# Patient Record
Sex: Female | Born: 1985
Health system: Southern US, Community
[De-identification: ages and names within clinical notes are randomized; demographics above are authoritative.]

## PROBLEM LIST (undated history)

## (undated) DIAGNOSIS — F329 Major depressive disorder, single episode, unspecified: Secondary | ICD-10-CM

## (undated) DIAGNOSIS — F32A Depression, unspecified: Secondary | ICD-10-CM

## (undated) DIAGNOSIS — Z9889 Other specified postprocedural states: Secondary | ICD-10-CM

## (undated) DIAGNOSIS — I10 Essential (primary) hypertension: Secondary | ICD-10-CM

## (undated) DIAGNOSIS — N185 Chronic kidney disease, stage 5: Secondary | ICD-10-CM

## (undated) DIAGNOSIS — R112 Nausea with vomiting, unspecified: Secondary | ICD-10-CM

## (undated) DIAGNOSIS — E119 Type 2 diabetes mellitus without complications: Secondary | ICD-10-CM

## (undated) DIAGNOSIS — J189 Pneumonia, unspecified organism: Secondary | ICD-10-CM

## (undated) DIAGNOSIS — R0600 Dyspnea, unspecified: Secondary | ICD-10-CM

## (undated) DIAGNOSIS — D649 Anemia, unspecified: Secondary | ICD-10-CM

## (undated) DIAGNOSIS — I5020 Unspecified systolic (congestive) heart failure: Secondary | ICD-10-CM

## (undated) DIAGNOSIS — N186 End stage renal disease: Secondary | ICD-10-CM

## (undated) DIAGNOSIS — G43909 Migraine, unspecified, not intractable, without status migrainosus: Secondary | ICD-10-CM

---

## 2014-09-01 ENCOUNTER — Encounter (HOSPITAL_COMMUNITY): Payer: Self-pay | Admitting: Emergency Medicine

## 2014-09-01 ENCOUNTER — Emergency Department (HOSPITAL_COMMUNITY)
Admission: EM | Admit: 2014-09-01 | Discharge: 2014-09-01 | Disposition: A | Discharge status: Home / Self Care | Payer: Medicaid - Out of State | Attending: Emergency Medicine | Admitting: Emergency Medicine

## 2014-09-01 DIAGNOSIS — Z79899 Other long term (current) drug therapy: Secondary | ICD-10-CM | POA: Insufficient documentation

## 2014-09-01 DIAGNOSIS — E1165 Type 2 diabetes mellitus with hyperglycemia: Secondary | ICD-10-CM | POA: Diagnosis not present

## 2014-09-01 DIAGNOSIS — Z9114 Patient's other noncompliance with medication regimen: Secondary | ICD-10-CM | POA: Diagnosis not present

## 2014-09-01 DIAGNOSIS — R739 Hyperglycemia, unspecified: Secondary | ICD-10-CM

## 2014-09-01 LAB — URINALYSIS, ROUTINE W REFLEX MICROSCOPIC
BILIRUBIN URINE: NEGATIVE
Glucose, UA: >1000 mg/dL — AB
Ketones, ur: NEGATIVE mg/dL
NITRITE: NEGATIVE
PROTEIN: 100 mg/dL — AB
SPECIFIC GRAVITY, URINE: 1.031 — AB (ref 1.005–1.030)
UROBILINOGEN UA: 0.2 mg/dL (ref 0.0–1.0)
pH: 5.5 (ref 5.0–8.0)

## 2014-09-01 LAB — COMPREHENSIVE METABOLIC PANEL
ALT: 9 U/L (ref 0–35)
ANION GAP: 16 — AB (ref 5–15)
AST: 15 U/L (ref 0–37)
Albumin: 2.2 g/dL — ABNORMAL LOW (ref 3.5–5.2)
Alkaline Phosphatase: 81 U/L (ref 39–117)
BUN: 14 mg/dL (ref 6–23)
CO2: 20 meq/L (ref 19–32)
Calcium: 8.8 mg/dL (ref 8.4–10.5)
Chloride: 93 mEq/L — ABNORMAL LOW (ref 96–112)
Creatinine, Ser: 0.81 mg/dL (ref 0.50–1.10)
GLUCOSE: 410 mg/dL — AB (ref 70–99)
Potassium: 4.2 mEq/L (ref 3.7–5.3)
SODIUM: 129 meq/L — AB (ref 137–147)
TOTAL PROTEIN: 7.2 g/dL (ref 6.0–8.3)
Total Bilirubin: <0.2 mg/dL — ABNORMAL LOW (ref 0.3–1.2)

## 2014-09-01 LAB — I-STAT BETA HCG BLOOD, ED (MC, WL, AP ONLY): I-stat hCG, quantitative: <5 m[IU]/mL (ref <5)

## 2014-09-01 LAB — CBC
HEMATOCRIT: 38 % (ref 36.0–46.0)
HEMOGLOBIN: 11.8 g/dL — AB (ref 12.0–15.0)
MCH: 19.3 pg — AB (ref 26.0–34.0)
MCHC: 31.1 g/dL (ref 30.0–36.0)
MCV: 62.3 fL — ABNORMAL LOW (ref 78.0–100.0)
Platelets: 335 10*3/uL (ref 150–400)
RBC: 6.1 MIL/uL — AB (ref 3.87–5.11)
RDW: 16.1 % — ABNORMAL HIGH (ref 11.5–15.5)
WBC: 7.7 10*3/uL (ref 4.0–10.5)

## 2014-09-01 LAB — BLOOD GAS, VENOUS
Acid-base deficit: 2.4 mmol/L — ABNORMAL HIGH (ref 0.0–2.0)
Bicarbonate: 22.1 mEq/L (ref 20.0–24.0)
O2 Saturation: 61.1 %
Patient temperature: 98.6
TCO2: 20.2 mmol/L (ref 0–100)
pCO2, Ven: 39.3 mmHg — ABNORMAL LOW (ref 45.0–50.0)
pH, Ven: 7.369 — ABNORMAL HIGH (ref 7.250–7.300)

## 2014-09-01 LAB — URINE MICROSCOPIC-ADD ON

## 2014-09-01 LAB — CBG MONITORING, ED
Glucose-Capillary: 431 mg/dL — ABNORMAL HIGH (ref 70–99)
Glucose-Capillary: 327 mg/dL — ABNORMAL HIGH (ref 70–99)

## 2014-09-01 MED ORDER — FREESTYLE SYSTEM KIT: 1.0000 | PACK | Status: DC | PRN

## 2014-09-01 MED ORDER — METFORMIN HCL 500 MG PO TABS: 500.0000 mg | ORAL_TABLET | Freq: Two times a day (BID) | ORAL | Status: DC

## 2014-09-01 MED ORDER — SODIUM CHLORIDE 0.9 % IV BOLUS (SEPSIS)
2000.0000 mL | Freq: Once | INTRAVENOUS | Status: AC
  Administered 2014-09-01: 2000 mL via INTRAVENOUS

## 2014-09-01 NOTE — ED Notes (Addendum)
VBG results to Dr. Estell Harpin

## 2014-09-01 NOTE — ED Provider Notes (Signed)
CSN: [id number]     Arrival date & time 09/01/14  1900 History   First MD Initiated Contact with Patient 09/01/14 1950     Chief Complaint  Patient presents with  . Hyperglycemia     (Consider location/radiation/quality/duration/timing/severity/associated sxs/prior Treatment) HPI   Lisa Hodges is a 28 y.o. female complaining of lightheaded sensation and worsening over the course of several days. Patient called 911, EMS checked her blood sugar it was 500. Patient is non-insulin-dependent diabetic, should be taking metformin 500 twice a day but has not been taking it regularly, states that she didn't know if her insurance was active, noncompliant for several months. She endorses polyuria and polydipsia. Denies fever, chills, nausea, vomiting, chest pain, shortness of breath.   Past Medical History  Diagnosis Date  . Diabetes mellitus without complication    No past surgical history on file. No family history on file. History  Substance Use Topics  . Smoking status: Never Smoker   . Smokeless tobacco: Not on file  . Alcohol Use: No   OB History    No data available     Review of Systems  10 systems reviewed and found to be negative, except as noted in the HPI.   Allergies  Review of patient's allergies indicates no known allergies.  Home Medications   Prior to Admission medications   Medication Sig Start Date End Date Taking? Authorizing Provider  glucose monitoring kit (FREESTYLE) monitoring kit 1 each by Does not apply route as needed for other. 09/01/14   Dayvin Aber, PA-C  metFORMIN (GLUCOPHAGE) 500 MG tablet Take 1 tablet (500 mg total) by mouth 2 (two) times daily with a meal. 09/01/14   Paula Busenbark, PA-C   BP 91/66 mmHg  Pulse 98  Temp(Src) 98.3 F (36.8 C) (Oral)  Resp 18  SpO2 97%  LMP 08/02/2014 Physical Exam  Constitutional: She is oriented to person, place, and time. She appears well-developed and well-nourished. No distress.  HENT:  Head:  Normocephalic and atraumatic.  Very dry mucous membranes  Eyes: Conjunctivae and EOM are normal. Pupils are equal, round, and reactive to light.  Neck: Normal range of motion. Neck supple.  Cardiovascular: Normal rate, regular rhythm and intact distal pulses.   Pulmonary/Chest: Effort normal and breath sounds normal. No stridor. No respiratory distress. She has no wheezes. She has no rales. She exhibits no tenderness.  Abdominal: Soft. Bowel sounds are normal. She exhibits no distension and no mass. There is no tenderness. There is no rebound and no guarding.  Musculoskeletal: Normal range of motion. She exhibits no edema.  Neurological: She is alert and oriented to person, place, and time.  Psychiatric: She has a normal mood and affect.  Nursing note and vitals reviewed.   ED Course  Procedures (including critical care time) Labs Review Labs Reviewed  CBC - Abnormal; Notable for the following:    RBC 6.10 (*)    Hemoglobin 11.8 (*)    MCV 62.3 (*)    MCH 19.3 (*)    RDW 16.1 (*)    All other components within normal limits  COMPREHENSIVE METABOLIC PANEL - Abnormal; Notable for the following:    Sodium 129 (*)    Chloride 93 (*)    Glucose, Bld 410 (*)    Albumin 2.2 (*)    Total Bilirubin <0.2 (*)    Anion gap 16 (*)    All other components within normal limits  URINALYSIS, ROUTINE W REFLEX MICROSCOPIC - Abnormal; Notable for the  following:    APPearance CLOUDY (*)    Specific Gravity, Urine 1.031 (*)    Glucose, UA >1000 (*)    Hgb urine dipstick TRACE (*)    Protein, ur 100 (*)    Leukocytes, UA TRACE (*)    All other components within normal limits  BLOOD GAS, VENOUS - Abnormal; Notable for the following:    pH, Ven 7.369 (*)    pCO2, Ven 39.3 (*)    Acid-base deficit 2.4 (*)    All other components within normal limits  URINE MICROSCOPIC-ADD ON - Abnormal; Notable for the following:    Squamous Epithelial / LPF FEW (*)    Bacteria, UA MANY (*)    All other  components within normal limits  CBG MONITORING, ED - Abnormal; Notable for the following:    Glucose-Capillary 431 (*)    All other components within normal limits  CBG MONITORING, ED - Abnormal; Notable for the following:    Glucose-Capillary 327 (*)    All other components within normal limits  I-STAT BETA HCG BLOOD, ED (MC, WL, AP ONLY)    Imaging Review No results found.   EKG Interpretation None      MDM   Final diagnoses:  Hyperglycemia without ketosis    Filed Vitals:   09/01/14 1911 09/01/14 2127 09/01/14 2310  BP: 120/86 118/72 91/66  Pulse: 100 95 98  Temp: 98.5 F (36.9 C) 98.3 F (36.8 C)   TempSrc: Oral Oral   Resp: _0 SpO2: 100% 100% 97%    Medications  sodium chloride 0.9 % bolus 2,000 mL (0 mLs Intravenous Stopped 09/01/14 2225)    [place] is a pleasant 28 y.o. female presenting with lightheadedness, patient appears very dehydrated, she's been noncompliant with her metformin for several months. Blood glucose is elevated to 431, VBG is non-concerning. Hyperglycemia is likely secondary to medication noncompliance, no signs of infection.  Patient has a very mildly increased anion gap of 16. Her corrected sodium is 136. Blood glucose has normalized after 2 L. Patient has no signs of urinary tract infection, we'll defer treatment at this time. Prescription given for metformin.  Evaluation does not show pathology that would require ongoing emergent intervention or inpatient treatment. Pt is hemodynamically stable and mentating appropriately. Discussed findings and plan with patient/guardian, who agrees with care plan. All questions answered. Return precautions discussed and outpatient follow up given.         Monico Blitz, PA-C 09/02/14 0112  Maudry Diego, MD 09/03/14 [phone]

## 2014-09-01 NOTE — ED Notes (Signed)
Pt reports via EMS with c/o hyperglycemia. Pt recently moved here from [place] and does not have a glucometer or endocrinologist. When EMS check pt's sugar, her CBG was 500. Pt c/o generalized weakness and dizziness.

## 2014-09-01 NOTE — Discharge Instructions (Signed)
Do not hesitate to return to the emergency room for any new, worsening or concerning symptoms.  Please obtain primary care using resource guide below. But the minute you were seen in the emergency room and that they will need to obtain records for further outpatient management.   Emergency Department Resource Guide 1) Find a Doctor and Pay Out of Pocket Although you won't have to find out who is covered by your insurance plan, it is a good idea to ask around and get recommendations. You will then need to call the office and see if the doctor you have chosen will accept you as a new patient and what types of options they offer for patients who are self-pay. Some doctors offer discounts or will set up payment plans for their patients who do not have insurance, but you will need to ask so you aren't surprised when you get to your appointment.  2) Contact Your Local Health Department Not all health departments have doctors that can see patients for sick visits, but many do, so it is worth a call to see if yours does. If you don't know where your local health department is, you can check in your phone book. The CDC also has a tool to help you locate your state's health department, and many state websites also have listings of all of their local health departments.  3) Find a [facility] If your illness is not likely to be very severe or complicated, you may want to try a walk in clinic. These are popping up all over the country in pharmacies, drugstores, and shopping centers. They're usually staffed by nurse practitioners or physician assistants that have been trained to treat common illnesses and complaints. They're usually fairly quick and inexpensive. However, if you have serious medical issues or chronic medical problems, these are probably not your best option.  No Primary Care Doctor: - Call Health Connect at  [phone] - they can help you locate a primary care doctor that  accepts your insurance,  provides certain services, etc. - Physician Referral Service- [phone]  Chronic Pain Problems: Organization         Address  Phone   Notes  [facility]  [phone] Patients need to be referred by their primary care doctor.   Medication Assistance: Organization         Address  Phone   Notes  [facility] Medication [facility] [address] [place], [place] [address] [phone] --Must be a resident of [facility] -- Must have NO insurance coverage whatsoever (no Medicaid/ Medicare, etc.) -- The pt. MUST have a primary care doctor that directs their care regularly and follows them in the community   MedAssist  [phone]   [employer]  [phone]    Agencies that provide inexpensive medical care: Organization         Address  Phone   Notes  [place]  [phone]   [facility]    [phone]   [facility] [place], [place] [address] [phone]   [place] [address] [place] [phone]   Planned Parenthood    [phone]   [facility]    [phone]   [place] [address], [place] Phone:  [phone], Fax:  [phone] Hours of Operation:  9 am - 6  pm, M-F.  Also accepts Medicaid/Medicare and self-pay.  [facility] for [place] [address], [address], [place] Phone: [phone], Fax: [phone]. Hours of Operation:  8:30 am - 5:30 pm, M-F.  Also accepts Medicaid and self-pay.  [facility] [place] [address], [place] Phone: [phone]   [place], [place], [place] [phone], Ext. [phone] Mondays & Thursdays: 7-9 AM.  First 15 patients are seen on a first come, first serve basis.    [place] Providers:  Organization         Address  Phone    Notes  [facility] [address], [place] [phone] Also accepts self-pay patients.  [facility] [address], [place]  [phone]   [place], [address], [place] [phone]   [facility] [address] [place] [phone]   Lucianne Lei [address], [place]   [phone] Only accepts [place] Access [place] patients after they have their name applied to their card.   Self-Pay (no insurance) in [facility]:  Organization         Address  Phone   Notes  Sickle Cell Patients, [facility] [place] [phone]   [facility] [place] [phone]   [facility] [place]  [place], [place], [place] [phone]   [facility]/Dr. Osei-Bonsu  [address], [place] or [address], [place] [phone] Phone number for both [place] and [place] locations is the same.  [facility] [address], [place] [phone]   [facility] [address], [place] or [address] [phone]   [facility] [address] [place] [phone], phone; [phone], fax Sees patients 1st and 3rd Saturday of every month.  Must not qualify for public or private insurance (i.e. Medicaid, Medicare, [place] Health Choice, Veterans' Benefits)  Household income should be no more than 200% of the poverty level The clinic cannot treat you if you are pregnant or think you are pregnant  Sexually transmitted diseases are not treated at the clinic.    Dental Care: Organization         Address  Phone  Notes  [facility] Department of [facility] [phone] Accepts children up to age 13 who are enrolled in [place] or [place]; pregnant women with a Medicaid card; and children who have applied for Medicaid or [place] Health Choice, but were declined, whose parents can pay a reduced fee at time of service.  [facility] Department of [facility]  [address], [place] [phone] Accepts children up to age 40 who are enrolled in [place] or [place]; pregnant women with a Medicaid card; and children who have applied for Medicaid or [place] Health Choice, but were declined, whose parents can pay a reduced fee at time of service.  [place] Adult [facility] Access PROGRAM  [place] [phone] Patients are seen by appointment only. Walk-ins are not accepted. [place] will see patients 38 years of age and older.  Monday - Tuesday (8am-5pm) Most Wednesdays (8:30-5pm) $30 per visit, cash only  [facility] Adult Dental Access PROGRAM  [address], [facility] [phone] Patients are seen by appointment only. Walk-ins are not accepted. [place] will see patients 26 years of age and older. One Wednesday Evening (Monthly: Volunteer Based).  $30 per visit, cash only  [place]  [phone] for adults; Children under age 47, call Graduate Pediatric Dentistry at [phone]. Children aged 30-14, please call [phone] to request a pediatric application.  Dental services are provided in all areas of dental care including fillings, crowns and bridges, complete and partial dentures, implants, gum treatment, root canals, and extractions. Preventive care is also provided. Treatment is provided to both adults and children. Patients are selected via a lottery and there is often a waiting list.   [facility] [address] [place]  [phone] [email or web address]   [facility] [address] [place], [place] [phone], Ext.  123 Second and Fourth Thursday of each month, opens at 6:30 AM; Clinic ends at 9 AM.  Patients are seen on a first-come first-served basis, and a limited number are seen during each clinic.   [facility]  [address] Hillard Danker [place], [place] [phone]   Eligibility Requirements You must have lived in [place], [place], or [place] counties for at least the last three months.   You cannot be eligible for state or federal sponsored [employer], including [employer], [place], or [employer].   You generally cannot be eligible for healthcare insurance through your employer.    How to apply: Eligibility screenings are held every Tuesday and Wednesday afternoon from 1:00 pm until 4:00 pm. You do not need an appointment for the interview!  [facility] [address], [place], [place]   [place]  Justice Department  [place]  [phone]    Behavioral Health Resources in the Community: Intensive Outpatient Programs Organization         Address  Phone  Notes  [facility] [place]. [address] [place], [place] [phone]   [facility] Outpatient [address], [place], [place]   ADS: Alcohol & Drug Svcs [address] [place], [place]   [place] [address],  [place], [place] or [phone]   Substance Abuse Resources Organization         Address  Phone  Notes  Alcohol and Drug Services  [phone]   [place]  [phone]   [facility] [place]   Chinita Pester  [phone]   Residential & Outpatient Substance Abuse Program  [phone]   Psychological Services Organization         Address  Phone  Notes  [facility]  [place]  [phone]   [place] [address], [place] or [phone]    Mobile Crisis Teams Organization         Address  Phone  Notes  [facility], Mobile Crisis Care Unit  [phone]   [facility]  [address] [place], [place]   [place] [address], [place] [phone]    Self-Help/Support Groups Organization         Address  Phone  Notes  Mental Health Assoc. of  - variety of support groups  336- I7437963 Call for more information  Narcotics Anonymous (NA), [facility] [address], [employer] [place]  2 meetings at this location   Statistician         Address  Phone  Notes  [facility] [address],    [place]  [phone]   [facility]  [address] [place] [address], [place], [place] [phone]   [facility] [address] [place], [place] [phone] Admissions: 8am-3pm M-F  [facility] [address],    [place], [place] [phone]   [facility] [address] [place], [place], [place] [phone]   [facility] [address]  [place], [place] [phone]   [facility] - Intensive Outpatient [address]urell [address], [place], [place] [phone]   [facility] (Addiction Recovery [facility] [address]  [place], [place] [phone] or [phone]   [facility] (RTS) [address], [place], [place] [phone] Accepts Medicaid  [facility] [address]  [place] [phone] Substance Abuse/Addiction Treatment   [facility] Organization         Address  Phone  Notes  [facility]  [phone]   Angie Fava, PhD [address] [place]vin Knack [place], [place]   [phone] or ([phone]   [facility]   [address] [place], [place] [phone]   [facility] [address], [place], [place] [phone] Insurance/Medicaid/sponsorship through [facility] and Families [address]                                    [place], [place] [phone] Therapy/tele-psych/case  [facility] [address], [place] [phone]    Dr. Lolly Mustache  [phone]   [facility]  [employer] [facility] Dept. 1) [address], [place] 2) [address] [place] 3)  [address], [place] [phone]  [phone]   [employer] [facility] Child Abuse Hotline 952-802-5369 or 615-269-3312 (After Hours)       High Blood Sugar High blood sugar (hyperglycemia) means that the level of sugar in your blood is higher than it should be. Signs of high blood sugar include:  Feeling thirsty.  Frequent peeing (urinating).  Feeling tired or sleepy.  Dry mouth.  Vision changes.  Feeling weak.  Feeling hungry but losing weight.  Numbness and tingling in your hands or feet.  Headache. When you ignore these signs, your blood sugar may keep going up. These problems may get worse, and other problems may begin. HOME CARE  Check your blood sugars as told by your doctor. Write down the numbers with the date and time.  Take the right amount of insulin or diabetes pills at the right time. Write down the dose with date and time.  Refill your insulin or diabetes pills before running out.  Watch what you eat. Follow your meal plan.  Drink liquids without sugar, such as water. Check with your doctor if you have kidney or heart disease.  Follow your doctor's orders for exercise. Exercise at the same time of day.  Keep your doctor's appointments. GET HELP RIGHT AWAY IF:  You have trouble thinking or are confused.  You have fast breathing with fruity smelling breath.  You pass out (faint).  You have 2 to 3 days of high blood  sugars and you do not know why.  You have chest pain.  You are feeling sick to your stomach (nauseous) or throwing up (vomiting).  You have sudden vision changes. MAKE SURE YOU:   Understand these instructions.  Will watch your condition.  Will get help right away if you are not doing well or get worse. Document Released: 07/09/2009 Document Revised: 12/04/2011 Document Reviewed: 07/09/2009 [facility] Patient Information 2015 [place], [place]. This information is not intended to replace advice given to you by your health care provider. Make sure you discuss any questions you have with your health care provider.

## 2014-10-09 ENCOUNTER — Ambulatory Visit: Payer: PRIVATE HEALTH INSURANCE | Admitting: Family Medicine

## 2014-11-03 ENCOUNTER — Ambulatory Visit: Payer: Medicaid - Out of State | Admitting: Family Medicine

## 2015-10-12 ENCOUNTER — Emergency Department (HOSPITAL_COMMUNITY)
Admission: EM | Admit: 2015-10-12 | Discharge: 2015-10-12 | Discharge status: Against Medical Advice | Payer: PRIVATE HEALTH INSURANCE | Attending: Emergency Medicine | Admitting: Emergency Medicine

## 2015-10-12 ENCOUNTER — Encounter (HOSPITAL_COMMUNITY): Payer: Self-pay | Admitting: Emergency Medicine

## 2015-10-12 DIAGNOSIS — R1011 Right upper quadrant pain: Secondary | ICD-10-CM | POA: Insufficient documentation

## 2015-10-12 DIAGNOSIS — E119 Type 2 diabetes mellitus without complications: Secondary | ICD-10-CM | POA: Diagnosis not present

## 2015-10-12 DIAGNOSIS — R110 Nausea: Secondary | ICD-10-CM | POA: Insufficient documentation

## 2015-10-12 LAB — COMPREHENSIVE METABOLIC PANEL
ALT: 13 U/L — AB (ref 14–54)
AST: 23 U/L (ref 15–41)
Albumin: 2.3 g/dL — ABNORMAL LOW (ref 3.5–5.0)
Alkaline Phosphatase: 52 U/L (ref 38–126)
Anion gap: 10 (ref 5–15)
BILIRUBIN TOTAL: 0.5 mg/dL (ref 0.3–1.2)
BUN: 14 mg/dL (ref 6–20)
CALCIUM: 8.4 mg/dL — AB (ref 8.9–10.3)
CO2: 20 mmol/L — ABNORMAL LOW (ref 22–32)
Chloride: 103 mmol/L (ref 101–111)
Creatinine, Ser: 1.06 mg/dL — ABNORMAL HIGH (ref 0.44–1.00)
Glucose, Bld: 417 mg/dL — ABNORMAL HIGH (ref 65–99)
Potassium: 4.2 mmol/L (ref 3.5–5.1)
Sodium: 133 mmol/L — ABNORMAL LOW (ref 135–145)
TOTAL PROTEIN: 6.4 g/dL — AB (ref 6.5–8.1)

## 2015-10-12 LAB — LIPASE, BLOOD: Lipase: 48 U/L (ref 11–51)

## 2015-10-12 LAB — CBC
HCT: 34 % — ABNORMAL LOW (ref 36.0–46.0)
Hemoglobin: 10.5 g/dL — ABNORMAL LOW (ref 12.0–15.0)
MCH: 20.3 pg — ABNORMAL LOW (ref 26.0–34.0)
MCHC: 30.9 g/dL (ref 30.0–36.0)
MCV: 65.9 fL — ABNORMAL LOW (ref 78.0–100.0)
PLATELETS: 427 10*3/uL — AB (ref 150–400)
RBC: 5.16 MIL/uL — AB (ref 3.87–5.11)
RDW: 14.6 % (ref 11.5–15.5)
WBC: 7 10*3/uL (ref 4.0–10.5)

## 2015-10-12 NOTE — ED Notes (Signed)
Pt from home. Pt began to have RUQ pain today along with nipple and CP on arrival. Does have nausea, but no v/d. CBG with EMS 420.

## 2015-10-12 NOTE — ED Notes (Signed)
Pt called for exam room, however no answer from lobby.

## 2015-10-12 NOTE — ED Notes (Signed)
No answer from waiting room.

## 2015-10-26 ENCOUNTER — Inpatient Hospital Stay (HOSPITAL_COMMUNITY)
Admission: AD | Admit: 2015-10-26 | Discharge: 2015-10-26 | Disposition: A | Discharge status: Home / Self Care | Payer: Self-pay | Source: Ambulatory Visit | Attending: Obstetrics & Gynecology | Admitting: Obstetrics & Gynecology

## 2015-10-26 ENCOUNTER — Inpatient Hospital Stay (HOSPITAL_COMMUNITY): Payer: PRIVATE HEALTH INSURANCE

## 2015-10-26 ENCOUNTER — Encounter (HOSPITAL_COMMUNITY): Payer: Self-pay | Admitting: *Deleted

## 2015-10-26 DIAGNOSIS — O209 Hemorrhage in early pregnancy, unspecified: Secondary | ICD-10-CM

## 2015-10-26 DIAGNOSIS — E1165 Type 2 diabetes mellitus with hyperglycemia: Secondary | ICD-10-CM | POA: Insufficient documentation

## 2015-10-26 DIAGNOSIS — O4691 Antepartum hemorrhage, unspecified, first trimester: Secondary | ICD-10-CM

## 2015-10-26 DIAGNOSIS — O021 Missed abortion: Secondary | ICD-10-CM | POA: Insufficient documentation

## 2015-10-26 DIAGNOSIS — Z794 Long term (current) use of insulin: Secondary | ICD-10-CM

## 2015-10-26 DIAGNOSIS — R109 Unspecified abdominal pain: Secondary | ICD-10-CM

## 2015-10-26 DIAGNOSIS — R7989 Other specified abnormal findings of blood chemistry: Secondary | ICD-10-CM

## 2015-10-26 DIAGNOSIS — O2341 Unspecified infection of urinary tract in pregnancy, first trimester: Secondary | ICD-10-CM

## 2015-10-26 DIAGNOSIS — IMO0001 Reserved for inherently not codable concepts without codable children: Secondary | ICD-10-CM | POA: Diagnosis present

## 2015-10-26 DIAGNOSIS — O26899 Other specified pregnancy related conditions, unspecified trimester: Secondary | ICD-10-CM

## 2015-10-26 DIAGNOSIS — O0883 Urinary tract infection following an ectopic and molar pregnancy: Secondary | ICD-10-CM | POA: Insufficient documentation

## 2015-10-26 DIAGNOSIS — N390 Urinary tract infection, site not specified: Secondary | ICD-10-CM | POA: Insufficient documentation

## 2015-10-26 DIAGNOSIS — E118 Type 2 diabetes mellitus with unspecified complications: Secondary | ICD-10-CM

## 2015-10-26 DIAGNOSIS — O10911 Unspecified pre-existing hypertension complicating pregnancy, first trimester: Secondary | ICD-10-CM

## 2015-10-26 DIAGNOSIS — R739 Hyperglycemia, unspecified: Secondary | ICD-10-CM | POA: Insufficient documentation

## 2015-10-26 DIAGNOSIS — O24111 Pre-existing diabetes mellitus, type 2, in pregnancy, first trimester: Secondary | ICD-10-CM

## 2015-10-26 DIAGNOSIS — O9989 Other specified diseases and conditions complicating pregnancy, childbirth and the puerperium: Secondary | ICD-10-CM

## 2015-10-26 HISTORY — DX: Essential (primary) hypertension: I10

## 2015-10-26 LAB — URINALYSIS, ROUTINE W REFLEX MICROSCOPIC
Bilirubin Urine: NEGATIVE
Glucose, UA: 500 mg/dL — AB
Ketones, ur: NEGATIVE mg/dL
LEUKOCYTES UA: NEGATIVE
Nitrite: POSITIVE — AB
Specific Gravity, Urine: 1.02 (ref 1.005–1.030)
pH: 6.5 (ref 5.0–8.0)

## 2015-10-26 LAB — COMPREHENSIVE METABOLIC PANEL
ALT: 13 U/L — AB (ref 14–54)
AST: 20 U/L (ref 15–41)
Albumin: 2.3 g/dL — ABNORMAL LOW (ref 3.5–5.0)
Alkaline Phosphatase: 56 U/L (ref 38–126)
Anion gap: 10 (ref 5–15)
BILIRUBIN TOTAL: 0.3 mg/dL (ref 0.3–1.2)
BUN: 16 mg/dL (ref 6–20)
CO2: 24 mmol/L (ref 22–32)
CREATININE: 1.1 mg/dL — AB (ref 0.44–1.00)
Calcium: 8.1 mg/dL — ABNORMAL LOW (ref 8.9–10.3)
Chloride: 101 mmol/L (ref 101–111)
Glucose, Bld: 321 mg/dL — ABNORMAL HIGH (ref 65–99)
Potassium: 3.7 mmol/L (ref 3.5–5.1)
Sodium: 135 mmol/L (ref 135–145)
TOTAL PROTEIN: 6.8 g/dL (ref 6.5–8.1)

## 2015-10-26 LAB — CBC
HCT: 33.2 % — ABNORMAL LOW (ref 36.0–46.0)
Hemoglobin: 10.4 g/dL — ABNORMAL LOW (ref 12.0–15.0)
MCH: 20.4 pg — ABNORMAL LOW (ref 26.0–34.0)
MCHC: 31.3 g/dL (ref 30.0–36.0)
MCV: 65.1 fL — ABNORMAL LOW (ref 78.0–100.0)
PLATELETS: 370 10*3/uL (ref 150–400)
RBC: 5.1 MIL/uL (ref 3.87–5.11)
RDW: 14.7 % (ref 11.5–15.5)
WBC: 6.4 10*3/uL (ref 4.0–10.5)

## 2015-10-26 LAB — POCT PREGNANCY, URINE: PREG TEST UR: POSITIVE — AB

## 2015-10-26 LAB — WET PREP, GENITAL
CLUE CELLS WET PREP: NONE SEEN
SPERM: NONE SEEN
TRICH WET PREP: NONE SEEN
WBC, Wet Prep HPF POC: NONE SEEN
YEAST WET PREP: NONE SEEN

## 2015-10-26 LAB — URINE MICROSCOPIC-ADD ON

## 2015-10-26 LAB — HCG, QUANTITATIVE, PREGNANCY: HCG, BETA CHAIN, QUANT, S: 5430 m[IU]/mL — AB (ref <5)

## 2015-10-26 LAB — GLUCOSE, CAPILLARY: Glucose-Capillary: 264 mg/dL — ABNORMAL HIGH (ref 65–99)

## 2015-10-26 LAB — ABO/RH: ABO/RH(D): AB POS

## 2015-10-26 MED ORDER — OXYCODONE-ACETAMINOPHEN 5-325 MG PO TABS: 1.0000 | ORAL_TABLET | Freq: Once | ORAL | Status: DC

## 2015-10-26 MED ORDER — ONDANSETRON 8 MG PO TBDP
8.0000 mg | ORAL_TABLET | Freq: Once | ORAL | Status: AC
  Administered 2015-10-26: 8 mg via ORAL
  Filled 2015-10-26: qty 1

## 2015-10-26 MED ORDER — HYDROCHLOROTHIAZIDE 25 MG PO TABS: 25.0000 mg | ORAL_TABLET | Freq: Every day | ORAL | Status: DC

## 2015-10-26 MED ORDER — SULFAMETHOXAZOLE-TRIMETHOPRIM 800-160 MG PO TABS: 1.0000 | ORAL_TABLET | Freq: Two times a day (BID) | ORAL | Status: DC

## 2015-10-26 MED ORDER — PROMETHAZINE HCL 25 MG PO TABS: 25.0000 mg | ORAL_TABLET | Freq: Four times a day (QID) | ORAL | Status: DC | PRN

## 2015-10-26 MED ORDER — METFORMIN HCL 500 MG PO TABS: 500.0000 mg | ORAL_TABLET | Freq: Two times a day (BID) | ORAL | Status: DC

## 2015-10-26 MED ORDER — LABETALOL HCL 100 MG PO TABS
200.0000 mg | ORAL_TABLET | Freq: Once | ORAL | Status: AC
  Administered 2015-10-26: 200 mg via ORAL
  Filled 2015-10-26: qty 2

## 2015-10-26 MED ORDER — OXYCODONE-ACETAMINOPHEN 5-325 MG PO TABS
2.0000 | ORAL_TABLET | Freq: Once | ORAL | Status: AC
  Administered 2015-10-26: 2 via ORAL
  Filled 2015-10-26: qty 2

## 2015-10-26 NOTE — MAU Provider Note (Signed)
Chief Complaint: Abdominal Pain; Vaginal Bleeding; and Possible Pregnancy   First Provider Initiated Contact with Patient 10/26/15 1441     SUBJECTIVE HPI: Lisa Hodges is a 30 y.o. G2P0101 at 8w5dwho presents to Maternity Admissions by EMS reporting low abd cramping, small amount of vaginal bleeding.   Location: suprapubic Quality: cramping Severity: 3/10 on pain scale Duration: <24 hours Context: None Timing: intermittent Modifying factors: None. Hasn't tried anything for pain Associated signs and symptoms: Pos for VB, urinary frequency. Neg for fever, chills, N/V/D/C, dysuria, flank pain, passage of clots or tissue.  Pt has been Dx'd w/ type DM and HTN in the past, but is not on meds now because she doesn't have insurance or Medicaid. Does not have PCP or endocrinologist.  Blood sugar was 400 per EMS. Saline bolus given in Ambulance.   Past Medical History  Diagnosis Date  . Diabetes mellitus without complication ([place]   . Hypertension    OB History  Gravida Para Term Preterm AB SAB TAB Ectopic Multiple Living  2 1  1      1     # Outcome Date GA Lbr Len/2nd Weight Sex Delivery Anes PTL Lv  2 Current           1 Preterm              Past Surgical History  Procedure Laterality Date  . Cesarean section     Social History   Social History  . Marital Status: Single    Spouse Name: N/A  . Number of Children: N/A  . Years of Education: N/A   Occupational History  . Not on file.   Social History Main Topics  . Smoking status: Never Smoker   . Smokeless tobacco: Not on file  . Alcohol Use: No  . Drug Use: No  . Sexual Activity: Not on file   Other Topics Concern  . Not on file   Social History Narrative   No current facility-administered medications on file prior to encounter.   Current Outpatient Prescriptions on File Prior to Encounter  Medication Sig Dispense Refill  . glucose monitoring kit (FREESTYLE) monitoring kit 1 each by Does not apply route as  needed for other. 1 each 0   No Known Allergies  I have reviewed the past Medical Hx, Surgical Hx, Social Hx, Allergies and Medications.   Review of Systems  Constitutional: Negative for fever and chills.  Respiratory: Negative for shortness of breath.   Cardiovascular: Negative for chest pain.  Gastrointestinal: Positive for abdominal pain. Negative for nausea, vomiting, diarrhea and constipation.  Genitourinary: Positive for frequency, vaginal bleeding and pelvic pain. Negative for dysuria, urgency, hematuria, flank pain and vaginal discharge.  Musculoskeletal: Negative for back pain.  Neurological: Negative for dizziness and headaches.    OBJECTIVE Patient Vitals for the past 24 hrs:  BP Temp Temp src Pulse Resp Height Weight  10/26/15 1755 133/77 mmHg - - 95 18 - -  10/26/15 1633 150/92 mmHg - - 96 - - -  10/26/15 1613 122/79 mmHg - - 93 16 - -  10/26/15 1518 156/86 mmHg - - 90 - - -  10/26/15 1423 164/100 mmHg - - 96 18 - -  10/26/15 1351 149/98 mmHg 98.2 F (36.8 C) Oral 103 20 4' 10"  (1.473 m) 180 lb 6.4 oz (81.829 kg)   Constitutional: Well-developed, well-nourished female in mild distress. Wearing sunglasses. Odd affect. ?Developmental delay Cardiovascular: normal rate Respiratory: normal rate and effort.  GI: Abd  soft, mild low abd tenderness, gravid appropriate for gestational age.  MS: Extremities nontender, no edema, normal ROM Neurologic: Alert and oriented x 4.  GU: Neg CVAT.  PELVIC EXAM: NEFG,moderate bright red blood noted on perineum and pad. Pt refused speculum and bimanual exam.  LAB RESULTS Results for orders placed or performed during the hospital encounter of 10/26/15 (from the past 24 hour(s))  Urinalysis, Routine w reflex microscopic (not at [facility])     Status: Abnormal   Collection Time: 10/26/15  1:55 PM  Result Value Ref Range   Color, Urine RED (A) YELLOW   APPearance CLOUDY (A) CLEAR   Specific Gravity, Urine 1.020 1.005 - 1.030   pH 6.5 5.0 -  8.0   Glucose, UA 500 (A) NEGATIVE mg/dL   Hgb urine dipstick LARGE (A) NEGATIVE   Bilirubin Urine NEGATIVE NEGATIVE   Ketones, ur NEGATIVE NEGATIVE mg/dL   Protein, ur >300 (A) NEGATIVE mg/dL   Nitrite POSITIVE (A) NEGATIVE   Leukocytes, UA NEGATIVE NEGATIVE  Urine microscopic-add on     Status: Abnormal   Collection Time: 10/26/15  1:55 PM  Result Value Ref Range   Squamous Epithelial / LPF 0-5 (A) NONE SEEN   WBC, UA 0-5 0 - 5 WBC/hpf   RBC / HPF TOO NUMEROUS TO COUNT 0 - 5 RBC/hpf   Bacteria, UA RARE (A) NONE SEEN  Pregnancy, urine POC     Status: Abnormal   Collection Time: 10/26/15  2:01 PM  Result Value Ref Range   Preg Test, Ur POSITIVE (A) NEGATIVE  hCG, quantitative, pregnancy     Status: Abnormal   Collection Time: 10/26/15  3:03 PM  Result Value Ref Range   hCG, Beta Chain, Quant, S 5430 (H) <5 mIU/mL  CBC     Status: Abnormal   Collection Time: 10/26/15  3:03 PM  Result Value Ref Range   WBC 6.4 4.0 - 10.5 K/uL   RBC 5.10 3.87 - 5.11 MIL/uL   Hemoglobin 10.4 (L) 12.0 - 15.0 g/dL   HCT 33.2 (L) 36.0 - 46.0 %   MCV 65.1 (L) 78.0 - 100.0 fL   MCH 20.4 (L) 26.0 - 34.0 pg   MCHC 31.3 30.0 - 36.0 g/dL   RDW 14.7 11.5 - 15.5 %   Platelets 370 150 - 400 K/uL  ABO/Rh     Status: None (Preliminary result)   Collection Time: 10/26/15  3:03 PM  Result Value Ref Range   ABO/RH(D) AB POS   Comprehensive metabolic panel     Status: Abnormal   Collection Time: 10/26/15  3:03 PM  Result Value Ref Range   Sodium 135 135 - 145 mmol/L   Potassium 3.7 3.5 - 5.1 mmol/L   Chloride 101 101 - 111 mmol/L   CO2 24 22 - 32 mmol/L   Glucose, Bld 321 (H) 65 - 99 mg/dL   BUN 16 6 - 20 mg/dL   Creatinine, Ser 1.10 (H) 0.44 - 1.00 mg/dL   Calcium 8.1 (L) 8.9 - 10.3 mg/dL   Total Protein 6.8 6.5 - 8.1 g/dL   Albumin 2.3 (L) 3.5 - 5.0 g/dL   AST 20 15 - 41 U/L   ALT 13 (L) 14 - 54 U/L   Alkaline Phosphatase 56 38 - 126 U/L   Total Bilirubin 0.3 0.3 - 1.2 mg/dL   GFR calc non Af Amer  >60 >60 mL/min   GFR calc Af Amer >60 >60 mL/min   Anion gap 10 5 - 15  Glucose, capillary     Status: Abnormal   Collection Time: 10/26/15  3:12 PM  Result Value Ref Range   Glucose-Capillary 264 (H) 65 - 99 mg/dL  Wet prep, genital     Status: None   Collection Time: 10/26/15  4:20 PM  Result Value Ref Range   Yeast Wet Prep HPF POC NONE SEEN NONE SEEN   Trich, Wet Prep NONE SEEN NONE SEEN   Clue Cells Wet Prep HPF POC NONE SEEN NONE SEEN   WBC, Wet Prep HPF POC NONE SEEN NONE SEEN   Sperm NONE SEEN     IMAGING US Ob Comp Less 14 Wks  10/26/2015  CLINICAL DATA:  Patient with pelvic cramping. EXAM: OBSTETRIC <14 WK [place] AND TRANSVAGINAL OB US TECHNIQUE: Both transabdominal and transvaginal ultrasound examinations were performed for complete evaluation of the gestation as well as the maternal uterus, adnexal regions, and pelvic cul-de-sac. Transvaginal technique was performed to assess early pregnancy. COMPARISON:  None. FINDINGS: Intrauterine gestational sac: Visualized/normal in shape. Yolk sac:  Not visualized Embryo:  Present Cardiac Activity: Not present Heart Rate: 0  bpm CRL:  9.6  mm   6 w   5 d                  [place] EDC: 06/15/2016 Subchorionic hemorrhage:  None visualized. Maternal uterus/adnexae: Multiple uterine fibroids are demonstrated. There is a 3.6 cm simple cyst within the left ovary. Right ovary is normal. No free fluid in the pelvis. IMPRESSION: Crown-rump length of 9.6 mm without cardiac activity. Findings meet definitive criteria for failed pregnancy. This follows SRU consensus guidelines: Diagnostic Criteria for Nonviable Pregnancy Early in the First Trimester. Alison Stalling J Med [phone]. Critical Value/emergent results were called by telephone at the time of interpretation on 10/26/2015 at 4:16 pm to Dr. Manya Silvas , who verbally acknowledged these results. Electronically Signed   By: Lovey Newcomer M.D.   On: 10/26/2015 16:20   US Ob Transvaginal  10/26/2015  CLINICAL  DATA:  Patient with pelvic cramping. EXAM: OBSTETRIC <14 WK [place] AND TRANSVAGINAL OB US TECHNIQUE: Both transabdominal and transvaginal ultrasound examinations were performed for complete evaluation of the gestation as well as the maternal uterus, adnexal regions, and pelvic cul-de-sac. Transvaginal technique was performed to assess early pregnancy. COMPARISON:  None. FINDINGS: Intrauterine gestational sac: Visualized/normal in shape. Yolk sac:  Not visualized Embryo:  Present Cardiac Activity: Not present Heart Rate: 0  bpm CRL:  9.6  mm   6 w   5 d                  [place] EDC: 06/15/2016 Subchorionic hemorrhage:  None visualized. Maternal uterus/adnexae: Multiple uterine fibroids are demonstrated. There is a 3.6 cm simple cyst within the left ovary. Right ovary is normal. No free fluid in the pelvis. IMPRESSION: Crown-rump length of 9.6 mm without cardiac activity. Findings meet definitive criteria for failed pregnancy. This follows SRU consensus guidelines: Diagnostic Criteria for Nonviable Pregnancy Early in the First Trimester. Alison Stalling J Med [phone]. Critical Value/emergent results were called by telephone at the time of interpretation on 10/26/2015 at 4:16 pm to Dr. Manya Silvas , who verbally acknowledged these results. Electronically Signed   By: Lovey Newcomer M.D.   On: 10/26/2015 16:20    MAU COURSE UPT, UA, [place], Quant, CBC, Wet prep, GC/Chlamydia, CBG.  Labetalol given for HTN.   Pt vomited, requesting meds. Cramping and bleeding worsening (bleeding stable). Percocet for pain.  Informed  of missed AB. Tearful, but appropriate. BF coming. Pt declines Chaplain.  MDM - 6.5 weeks missed AB. Bleeding, VS stable. Expectant management - Uncontrolled type 2 DM. No evidence of HHS. Will Start back on Metformin and Refer to PCP.  - Uncontrolled CHTN, but in hypertensive emergency. Start HCTZ and refer to PCP.   - UTI. Rx Bactrim - Mild elevated Creatinine likely from Hyperglycemia. Recheck at PCP  appt.   ASSESSMENT 1. UTI in pregnancy, antepartum, first trimester   2. Vaginal bleeding in pregnancy, first trimester   3. Abdominal pain affecting pregnancy, antepartum   4. Type 2 diabetes mellitus affecting pregnancy in first trimester, antepartum   5. Uncontrolled type 2 diabetes mellitus with hyperglycemia, without long-term current use of insulin ([place])   6. Chronic hypertension in pregnancy, first trimester   7. Missed abortion   8. Elevated serum creatinine     PLAN Discharge home in stable condition. Expectant management for Missed AB. SAB precaution.  Support given Lengthy discussion about the importance of getting DM and HTN under control, especially if TTC.  Metformin, HCTZ, Bactrim, Percocet, Phenergan.  Discussed Hx, exam, labs, [place] w/ Dr,. Anyanwu who agrees w. POC. Recommends starting HCTZ.  Follow-up Information    Follow up with [facility] In 2 weeks.   Specialty:  Obstetrics and Gynecology   Why:  Follow-up appointment after miscarriage   Contact information:   [place] [phone]      Follow up with [facility].   Why:  As needed in emergencies (fever greater than 100.4, severe bleeding or severe pain)   Contact information:   [address] [id number] [place] [phone]       Medication List    TAKE these medications        glucose monitoring kit monitoring kit  1 each by Does not apply route as needed for other.     hydrochlorothiazide 25 MG tablet  Commonly known as:  HYDRODIURIL  Take 1 tablet (25 mg total) by mouth daily.     metFORMIN 500 MG tablet  Commonly known as:  GLUCOPHAGE  Take 1 tablet (500 mg total) by mouth 2 (two) times daily with a meal.     oxyCODONE-acetaminophen 5-325 MG tablet  Commonly known as:  PERCOCET/ROXICET  Take 1-2 tablets by mouth once.     promethazine 25 MG tablet   Commonly known as:  PHENERGAN  Take 1 tablet (25 mg total) by mouth every 6 (six) hours as needed.     sulfamethoxazole-trimethoprim 800-160 MG tablet  Commonly known as:  BACTRIM DS,SEPTRA DS  Take 1 tablet by mouth 2 (two) times daily.       [place], [place] 10/26/2015  5:38 PM

## 2015-10-26 NOTE — MAU Note (Signed)
Has been cramping, felt like period was going to start.  +HPT yesterday. Started bleeding prior to calling ambulance.

## 2015-10-26 NOTE — Discharge Instructions (Signed)
Incomplete Miscarriage °A miscarriage is the sudden loss of an unborn baby (fetus) before the 20th week of pregnancy. In an incomplete miscarriage, parts of the fetus or placenta (afterbirth) remain in the body.  °Having a miscarriage can be an emotional experience. Talk with your health care provider about any questions you may have about miscarrying, the grieving process, and your future pregnancy plans. °CAUSES  °· Problems with the fetal chromosomes that make it impossible for the baby to develop normally. Problems with the baby's genes or chromosomes are most often the result of errors that occur by chance as the embryo divides and grows. The problems are not inherited from the parents. °· Infection of the cervix or uterus. °· Hormone problems. °· Problems with the cervix, such as having an incompetent cervix. This is when the tissue in the cervix is not strong enough to hold the pregnancy. °· Problems with the uterus, such as an abnormally shaped uterus, uterine fibroids, or congenital abnormalities. °· Certain medical conditions. °· Smoking, drinking alcohol, or taking illegal drugs. °· Trauma. °SYMPTOMS  °· Vaginal bleeding or spotting, with or without cramps or pain. °· Pain or cramping in the abdomen or lower back. °· Passing fluid, tissue, or blood clots from the vagina. °DIAGNOSIS  °Your health care provider will perform a physical exam. You may also have an ultrasound to confirm the miscarriage. Blood or urine tests may also be ordered. °TREATMENT  °· Usually, a dilation and curettage (D&C) procedure is performed. During a D&C procedure, the cervix is widened (dilated) and any remaining fetal or placental tissue is gently removed from the uterus. °· Antibiotic medicines are prescribed if there is an infection. Other medicines may be given to reduce the size of the uterus (contract) if there is a lot of bleeding. °· If you have Rh negative blood and your baby was Rh positive, you will need a Rho (D)  immune globulin shot. This shot will protect any future baby from having Rh blood problems in future pregnancies. °· You may be confined to bed rest. This means you should stay in bed and only get up to use the bathroom. °HOME CARE INSTRUCTIONS  °· Rest as directed by your health care provider. °· Restrict activity as directed by your health care provider. You may be allowed to continue light activity if curettage was not done but you require further treatment. °· Keep track of the number of pads you use each day. Keep track of how soaked (saturated) they are. Record this information. °· Do not  use tampons. °· Do not douche or have sexual intercourse until approved by your health care provider. °· Keep all follow-up appointments for reevaluation and continuing management. °· Only take over-the-counter or prescription medicines for pain, fever, or discomfort as directed by your health care provider. °· Take antibiotic medicine as directed by your health care provider. Make sure you finish it even if you start to feel better. °SEEK IMMEDIATE MEDICAL CARE IF:  °· You experience severe cramps in your stomach, back, or abdomen. °· You have an unexplained temperature (make sure to record these temperatures). °· You pass large clots or tissue (save these for your health care provider to inspect). °· Your bleeding increases. °· You become light-headed, weak, or have fainting episodes. °MAKE SURE YOU:  °· Understand these instructions. °· Will watch your condition. °· Will get help right away if you are not doing well or get worse. °  °This information is not intended to   replace advice given to you by your health care provider. Make sure you discuss any questions you have with your health care provider.   Document Released: 09/11/2005 Document Revised: 10/02/2014 Document Reviewed: 04/10/2013 Elsevier Interactive Patient Education 2016 [employer]   FACTS YOU SHOULD KNOW  WHAT IS AN EARLY PREGNANCY FAILURE? Once  the egg is fertilized with the sperm and begins to develop, it attaches to the lining of the uterus. This early pregnancy tissue may not develop into an embryo (the beginning stage of a baby). Sometimes an embryo does develop but does not continue to grow. These problems can be seen on ultrasound.   MANAGEMNT OF EARLY PREGNANCY FAILURE: About 4 out of 100 (0.25%) women will have a pregnancy loss in her lifetime.  One in five pregnancies is found to be an early pregnancy failure.  There are 3 ways to care for an early pregnancy failure:   (1) Surgery, (2) Medicine, (3) Waiting for you to pass the pregnancy on your own. The decision as to how to proceed after being diagnosed with and early pregnancy failure is an individual one.  The decision can be made only after appropriate counseling.  You need to weigh the pros and cons of the 3 choices. Then you can make the choice that works for you. Waiting  You may choose to wait, in which case your own body may complete the passing of the abnormal early pregnancy on its own in about 2-4 weeks  Your bleeding may be heavy at times  There is a small possibility that you may need surgery if the bleeding is too much or not all of the pregnancy has passed. SURGERY (D&E)  Procedure over in 1 day  Requires being put to sleep  Bleeding may be light  Possible problems during surgery, including injury to womb(uterus)  Care provider has more control Medicine (CYTOTEC)  The complete procedure may take days to weeks  No Surgery  Bleeding may be heavy at times  There may be drug side effects  Patient has more control CYTOTEC MANAGEMENT Prostaglandins (cytotec) are the most widely used drug for this purpose. They cause the uterus to cramp and contract. You will place the medicine yourself inside your vagina in the privacy of your home. Empting of the uterus should occur within 3 days but the process may continue for several weeks. The bleeding may seem  heavy at times. POSSIBLE SIDE EFFECTS FROM CYTOTEC  Nausea   Vomiting  Diarrhea Fever  Chills  Hot Flashes Side effects  from the process of the early pregnancy failure include:  Cramping  Bleeding  Headaches  Dizziness RISKS: This is a low risk procedure. Less than 1 in 100 women has a complication. An incomplete passage of the early pregnancy may occur. Also, Hemorrhage (heavy bleeding) could happen.  Rarely the pregnancy will not be passed completely. Excessively heavy bleeding may occur.  Your doctor may need to perform surgery to empty the uterus (D&E). Afterwards: Everybody will feel differently after the early pregnancy completion. You may have soreness or cramps for a day or two. You may have soreness or cramps for day or two.  You may have light bleeding for up to 2 weeks. You may be as active as you feel like being. If you have any of the following problems you may call Maternity Admissions Unit at [phone].  If you have pain that does not get better  with pain medication  Bleeding that soaks through 2 thick  sanitary pads in an hour °• Cramps that last longer than 2 days °• Foul smelling discharge °• Fever above 100.4 degrees F °Even if you do not have any of these symptoms, you should have a follow-up exam to make sure you are healing properly. This appointment will be made for you before you leave the hospital. Your next normal period will start again in 4-6 week after the loss. You can get pregnant soon after the loss, so use birth control right away. °Finally: °Make sure all your questions are answered before during and after any procedure. Follow up with medical care and family planning methods. ° °  ° ° °

## 2015-10-26 NOTE — MAU Note (Addendum)
Pt is a diabetic, was on oral medication, but no longer has medicaid- has not taken in over a year.  Blood sugar by ems was over 400, they gave her an IV fluid bolus. Saline lock in place

## 2015-10-27 LAB — GC/CHLAMYDIA PROBE AMP (~~LOC~~) NOT AT ARMC
Chlamydia: NEGATIVE
Neisseria Gonorrhea: NEGATIVE

## 2015-10-27 LAB — HIV ANTIBODY (ROUTINE TESTING W REFLEX): HIV Screen 4th Generation wRfx: NONREACTIVE

## 2015-10-28 ENCOUNTER — Other Ambulatory Visit: Payer: Self-pay

## 2015-10-28 DIAGNOSIS — R7989 Other specified abnormal findings of blood chemistry: Secondary | ICD-10-CM

## 2015-10-28 DIAGNOSIS — R109 Unspecified abdominal pain: Secondary | ICD-10-CM

## 2015-10-28 DIAGNOSIS — O2341 Unspecified infection of urinary tract in pregnancy, first trimester: Secondary | ICD-10-CM

## 2015-10-28 DIAGNOSIS — O021 Missed abortion: Secondary | ICD-10-CM

## 2015-10-28 DIAGNOSIS — E1165 Type 2 diabetes mellitus with hyperglycemia: Secondary | ICD-10-CM

## 2015-10-28 DIAGNOSIS — O209 Hemorrhage in early pregnancy, unspecified: Secondary | ICD-10-CM

## 2015-10-28 DIAGNOSIS — O26899 Other specified pregnancy related conditions, unspecified trimester: Secondary | ICD-10-CM

## 2015-10-28 DIAGNOSIS — O24111 Pre-existing diabetes mellitus, type 2, in pregnancy, first trimester: Secondary | ICD-10-CM

## 2015-10-28 DIAGNOSIS — O10911 Unspecified pre-existing hypertension complicating pregnancy, first trimester: Secondary | ICD-10-CM

## 2015-10-28 MED ORDER — METFORMIN HCL 500 MG PO TABS: 500.0000 mg | ORAL_TABLET | Freq: Two times a day (BID) | ORAL | Status: DC

## 2015-10-28 NOTE — Telephone Encounter (Signed)
Pt was seen on 10/26/2015 she was rx metformin but prescription was printed and not given. I have sent another rx to the pharmacy.

## 2016-08-30 ENCOUNTER — Encounter (HOSPITAL_COMMUNITY): Payer: Self-pay

## 2016-10-12 ENCOUNTER — Emergency Department (HOSPITAL_COMMUNITY): Payer: Medicaid Other

## 2016-10-12 ENCOUNTER — Emergency Department (HOSPITAL_COMMUNITY)
Admission: EM | Admit: 2016-10-12 | Discharge: 2016-10-12 | Disposition: A | Discharge status: Home / Self Care | Payer: Medicaid Other | Attending: Emergency Medicine | Admitting: Emergency Medicine

## 2016-10-12 ENCOUNTER — Encounter (HOSPITAL_COMMUNITY): Payer: Self-pay | Admitting: Emergency Medicine

## 2016-10-12 DIAGNOSIS — O209 Hemorrhage in early pregnancy, unspecified: Secondary | ICD-10-CM

## 2016-10-12 DIAGNOSIS — E1165 Type 2 diabetes mellitus with hyperglycemia: Secondary | ICD-10-CM

## 2016-10-12 DIAGNOSIS — O26899 Other specified pregnancy related conditions, unspecified trimester: Secondary | ICD-10-CM

## 2016-10-12 DIAGNOSIS — O10911 Unspecified pre-existing hypertension complicating pregnancy, first trimester: Secondary | ICD-10-CM

## 2016-10-12 DIAGNOSIS — J189 Pneumonia, unspecified organism: Secondary | ICD-10-CM

## 2016-10-12 DIAGNOSIS — O021 Missed abortion: Secondary | ICD-10-CM

## 2016-10-12 DIAGNOSIS — O24111 Pre-existing diabetes mellitus, type 2, in pregnancy, first trimester: Secondary | ICD-10-CM

## 2016-10-12 DIAGNOSIS — R7989 Other specified abnormal findings of blood chemistry: Secondary | ICD-10-CM

## 2016-10-12 DIAGNOSIS — I10 Essential (primary) hypertension: Secondary | ICD-10-CM | POA: Diagnosis not present

## 2016-10-12 DIAGNOSIS — R05 Cough: Secondary | ICD-10-CM | POA: Diagnosis present

## 2016-10-12 DIAGNOSIS — E119 Type 2 diabetes mellitus without complications: Secondary | ICD-10-CM | POA: Insufficient documentation

## 2016-10-12 DIAGNOSIS — R109 Unspecified abdominal pain: Secondary | ICD-10-CM

## 2016-10-12 DIAGNOSIS — O2341 Unspecified infection of urinary tract in pregnancy, first trimester: Secondary | ICD-10-CM

## 2016-10-12 LAB — BASIC METABOLIC PANEL
Anion gap: 5 (ref 5–15)
BUN: 22 mg/dL — AB (ref 6–20)
CHLORIDE: 108 mmol/L (ref 101–111)
CO2: 25 mmol/L (ref 22–32)
Calcium: 7.9 mg/dL — ABNORMAL LOW (ref 8.9–10.3)
Creatinine, Ser: 1.71 mg/dL — ABNORMAL HIGH (ref 0.44–1.00)
GFR calc Af Amer: 45 mL/min — ABNORMAL LOW (ref >60)
GFR calc non Af Amer: 39 mL/min — ABNORMAL LOW (ref >60)
GLUCOSE: 202 mg/dL — AB (ref 65–99)
POTASSIUM: 3.7 mmol/L (ref 3.5–5.1)
Sodium: 138 mmol/L (ref 135–145)

## 2016-10-12 LAB — CBG MONITORING, ED: Glucose-Capillary: 231 mg/dL — ABNORMAL HIGH (ref 65–99)

## 2016-10-12 LAB — CBC WITH DIFFERENTIAL/PLATELET
Basophils Absolute: 0 10*3/uL (ref 0.0–0.1)
Basophils Relative: 0 %
EOS PCT: 1 %
Eosinophils Absolute: 0.1 10*3/uL (ref 0.0–0.7)
HCT: 32.2 % — ABNORMAL LOW (ref 36.0–46.0)
HEMOGLOBIN: 10 g/dL — AB (ref 12.0–15.0)
LYMPHS PCT: 38 %
Lymphs Abs: 2 10*3/uL (ref 0.7–4.0)
MCH: 19 pg — AB (ref 26.0–34.0)
MCHC: 31.1 g/dL (ref 30.0–36.0)
MCV: 61.2 fL — AB (ref 78.0–100.0)
Monocytes Absolute: 0.3 10*3/uL (ref 0.1–1.0)
Monocytes Relative: 6 %
Neutro Abs: 2.9 10*3/uL (ref 1.7–7.7)
Neutrophils Relative %: 55 %
PLATELETS: 400 10*3/uL (ref 150–400)
RBC: 5.26 MIL/uL — AB (ref 3.87–5.11)
RDW: 16.2 % — ABNORMAL HIGH (ref 11.5–15.5)
WBC: 5.4 10*3/uL (ref 4.0–10.5)

## 2016-10-12 MED ORDER — SODIUM CHLORIDE 0.9 % IV BOLUS (SEPSIS)
500.0000 mL | Freq: Once | INTRAVENOUS | Status: AC
  Administered 2016-10-12: 500 mL via INTRAVENOUS

## 2016-10-12 MED ORDER — HYDROCHLOROTHIAZIDE 25 MG PO TABS: 25.0000 mg | ORAL_TABLET | Freq: Every day | ORAL | 0 refills | Status: DC

## 2016-10-12 MED ORDER — AZITHROMYCIN 250 MG PO TABS: 250.0000 mg | ORAL_TABLET | Freq: Every day | ORAL | 0 refills | Status: DC

## 2016-10-12 MED ORDER — DEXTROSE 5 % IV SOLN
500.0000 mg | Freq: Once | INTRAVENOUS | Status: AC
  Administered 2016-10-12: 500 mg via INTRAVENOUS
  Filled 2016-10-12: qty 500

## 2016-10-12 MED ORDER — METFORMIN HCL 500 MG PO TABS: 500.0000 mg | ORAL_TABLET | Freq: Two times a day (BID) | ORAL | 0 refills | Status: DC

## 2016-10-12 NOTE — ED Provider Notes (Signed)
[facility] DEPT Provider Note   CSN: [id number] Arrival date & time: 10/12/16  1258  History   Chief Complaint Chief Complaint  Patient presents with  . Cough    HPI Lisa Hodges is a 31 y.o. female.  HPI Pt has PMH of diabetes and hypertension comes to the ER with complaints of productive cough for a few days now. She is complaining of chest pain and coughing when she takes a large breath. She does not have any pain if she isn't coughing or taking a large breath. She denies having history of asthma. She also requests refill of DM and HTN medications.  She has been sick with cough for the past two months but acutely worsened 3 days ago. She otherwise is feeling well. NO respiratory distress, no weakness, confusion,headaches, fevers, le swelling, back pain, rash, syncope.  Past Medical History:  Diagnosis Date  . Diabetes mellitus without complication ([place])   . Hypertension     Patient Active Problem List   Diagnosis Date Noted  . Uncontrolled type 2 diabetes mellitus with hyperglycemia, without long-term current use of insulin ([place]) 10/26/2015  . Elevated serum creatinine 10/26/2015    Past Surgical History:  Procedure Laterality Date  . CESAREAN SECTION      OB History    Gravida Para Term Preterm AB Living   2 1   1   1    SAB TAB Ectopic Multiple Live Births                   Home Medications    Prior to Admission medications   Medication Sig Start Date End Date Taking? Authorizing Provider  azithromycin (ZITHROMAX) 250 MG tablet Take 1 tablet (250 mg total) by mouth daily. Take first 2 tablets together, then 1 every day until finished. 10/12/16   Delos Haring, PA-C  glucose monitoring kit (FREESTYLE) monitoring kit 1 each by Does not apply route as needed for other. 09/01/14   Nicole Pisciotta, PA-C  hydrochlorothiazide (HYDRODIURIL) 25 MG tablet Take 1 tablet (25 mg total) by mouth daily. 10/12/16   Ngai Parcell Carlota Raspberry, PA-C  metFORMIN (GLUCOPHAGE) 500 MG tablet  Take 1 tablet (500 mg total) by mouth 2 (two) times daily with a meal. 10/12/16   Delos Haring, PA-C  oxyCODONE-acetaminophen (PERCOCET/ROXICET) 5-325 MG tablet Take 1-2 tablets by mouth once. 10/26/15   Manya Silvas, CNM  promethazine (PHENERGAN) 25 MG tablet Take 1 tablet (25 mg total) by mouth every 6 (six) hours as needed. 10/26/15   Manya Silvas, CNM  sulfamethoxazole-trimethoprim (BACTRIM DS,SEPTRA DS) 800-160 MG tablet Take 1 tablet by mouth 2 (two) times daily. 10/26/15   Manya Silvas, CNM    Family History History reviewed. No pertinent family history.  Social History Social History  Substance Use Topics  . Smoking status: Never Smoker  . Smokeless tobacco: Not on file  . Alcohol use No     Allergies   Patient has no known allergies.   Review of Systems Review of Systems Review of Systems All other systems negative except as documented in the HPI. All pertinent positives and negatives as reviewed in the HPI.   Physical Exam Updated Vital Signs BP 152/98 (BP Location: Right Arm)   Pulse 113   Temp 99.2 F (37.3 C) (Oral)   Resp 20   Ht 4' 9"  (1.448 m)   Wt 96.2 kg   LMP 09/19/2016   SpO2 97%   BMI 45.88 kg/m   Physical Exam  Constitutional: She appears well-developed  and well-nourished. No distress.  HENT:  Head: Normocephalic and atraumatic.  Right Ear: Tympanic membrane and ear canal normal.  Left Ear: Tympanic membrane and ear canal normal.  Nose: Nose normal.  Mouth/Throat: Uvula is midline, oropharynx is clear and moist and mucous membranes are normal.  Eyes: Pupils are equal, round, and reactive to light.  Neck: Normal range of motion. Neck supple.  Cardiovascular: Regular rhythm.  Tachycardia present.   Pulmonary/Chest: Effort normal. No respiratory distress. She has no wheezes. She has rhonchi.  Abdominal: Soft.  No signs of abdominal distention  Musculoskeletal:  No LE swelling  Neurological: She is alert.  Acting at baseline  Skin: Skin  is warm and dry. No rash noted.  Nursing note and vitals reviewed.   ED Treatments / Results  Labs (all labs ordered are listed, but only abnormal results are displayed) Labs Reviewed  CBC WITH DIFFERENTIAL/PLATELET - Abnormal; Notable for the following:       Result Value   RBC 5.26 (*)    Hemoglobin 10.0 (*)    HCT 32.2 (*)    MCV 61.2 (*)    MCH 19.0 (*)    RDW 16.2 (*)    All other components within normal limits  BASIC METABOLIC PANEL - Abnormal; Notable for the following:    Glucose, Bld 202 (*)    BUN 22 (*)    Creatinine, Ser 1.71 (*)    Calcium 7.9 (*)    GFR calc non Af Amer 39 (*)    GFR calc Af Amer 45 (*)    All other components within normal limits  CBG MONITORING, ED - Abnormal; Notable for the following:    Glucose-Capillary 231 (*)    All other components within normal limits    EKG  EKG Interpretation None       Radiology Dg Chest 2 View  Result Date: 10/12/2016 CLINICAL DATA:  Productive cough.  Chest pain.  Dyspnea. EXAM: CHEST  2 VIEW COMPARISON:  None. FINDINGS: Low lung volumes. Top-normal heart size. Normal mediastinal contour. No pneumothorax. Trace right and small left pleural effusions. No pulmonary edema. Patchy consolidation in both lower lobes, left greater than right. IMPRESSION: 1. Patchy bilateral lower lobe consolidation, left greater than right, suspicious for multilobar pneumonia. 2. Small left and trace right pleural effusions. 3. Recommend follow-up PA and lateral post treatment chest radiographs in 4-6 weeks. Electronically Signed   By: Ilona Sorrel M.D.   On: 10/12/2016 15:52    Procedures Procedures (including critical care time)  Medications Ordered in ED Medications  sodium chloride 0.9 % bolus 500 mL (not administered)  azithromycin (ZITHROMAX) 500 mg in dextrose 5 % 250 mL IVPB (not administered)     Initial Impression / Assessment and Plan / ED Course  I have reviewed the triage vital signs and the nursing  notes.  Pertinent labs & imaging results that were available during my care of the patient were reviewed by me and considered in my medical decision making (see chart for details).    4:22 pm Pt is afebrile, with normal O2 on room air and blood pressure. She is tachyardic at 2 in the room with a multilobar pneumonia. Will give fluids and first dose of abx through IV and obtain labs to evaluate for dehydration.  5:11 pm- labs do show signs of mild dehydration, fluids replaced in ED and VS improved. Pt is well appearing and without signs of sepsis or respiratory distress.  She has been instructed to  f/u with her PCP. Home medications refilled per request. Strict return to ED precautions.  Final Clinical Impressions(s) / ED Diagnoses   Final diagnoses:  Community acquired pneumonia, unspecified laterality    New Prescriptions New Prescriptions   AZITHROMYCIN (ZITHROMAX) 250 MG TABLET    Take 1 tablet (250 mg total) by mouth daily. Take first 2 tablets together, then 1 every day until finished.     Delos Haring, PA-C 10/12/16 [place], DO 10/12/16 2302

## 2016-10-12 NOTE — ED Notes (Signed)
PT DISCHARGED. INSTRUCTIONS AND PRESCRIPTIONS GIVEN. AAOX4. PT IN NO APPARENT DISTRESS OR PAIN. THE OPPORTUNITY TO ASK QUESTIONS WAS PROVIDED. 

## 2016-10-12 NOTE — ED Triage Notes (Addendum)
Per EMS. Pt has had productive cough for the past several days. Now complains of CP with coughing and deep breathing. No CP with no coughing or deep breathing. No hx of asthma. Pt also ran out of DM and HTN medication.

## 2016-10-26 ENCOUNTER — Encounter (HOSPITAL_COMMUNITY): Payer: Self-pay | Admitting: Emergency Medicine

## 2016-10-26 ENCOUNTER — Inpatient Hospital Stay (HOSPITAL_COMMUNITY): Payer: Medicaid Other

## 2016-10-26 ENCOUNTER — Emergency Department (HOSPITAL_COMMUNITY): Payer: Medicaid Other

## 2016-10-26 ENCOUNTER — Inpatient Hospital Stay (HOSPITAL_COMMUNITY)
Admission: EM | Admit: 2016-10-26 | Discharge: 2016-11-08 | DRG: 291 | Disposition: A | Discharge status: Home / Self Care | Payer: Medicaid Other | Attending: Internal Medicine | Admitting: Internal Medicine

## 2016-10-26 DIAGNOSIS — R7989 Other specified abnormal findings of blood chemistry: Secondary | ICD-10-CM

## 2016-10-26 DIAGNOSIS — I10 Essential (primary) hypertension: Secondary | ICD-10-CM | POA: Diagnosis not present

## 2016-10-26 DIAGNOSIS — B9789 Other viral agents as the cause of diseases classified elsewhere: Secondary | ICD-10-CM | POA: Diagnosis present

## 2016-10-26 DIAGNOSIS — E1165 Type 2 diabetes mellitus with hyperglycemia: Secondary | ICD-10-CM

## 2016-10-26 DIAGNOSIS — Z3201 Encounter for pregnancy test, result positive: Secondary | ICD-10-CM | POA: Diagnosis not present

## 2016-10-26 DIAGNOSIS — J069 Acute upper respiratory infection, unspecified: Secondary | ICD-10-CM | POA: Diagnosis present

## 2016-10-26 DIAGNOSIS — J189 Pneumonia, unspecified organism: Secondary | ICD-10-CM | POA: Diagnosis present

## 2016-10-26 DIAGNOSIS — I4581 Long QT syndrome: Secondary | ICD-10-CM | POA: Diagnosis present

## 2016-10-26 DIAGNOSIS — N179 Acute kidney failure, unspecified: Secondary | ICD-10-CM | POA: Diagnosis present

## 2016-10-26 DIAGNOSIS — Z6841 Body Mass Index (BMI) 40.0 and over, adult: Secondary | ICD-10-CM

## 2016-10-26 DIAGNOSIS — E118 Type 2 diabetes mellitus with unspecified complications: Secondary | ICD-10-CM

## 2016-10-26 DIAGNOSIS — R000 Tachycardia, unspecified: Secondary | ICD-10-CM | POA: Diagnosis present

## 2016-10-26 DIAGNOSIS — I130 Hypertensive heart and chronic kidney disease with heart failure and stage 1 through stage 4 chronic kidney disease, or unspecified chronic kidney disease: Secondary | ICD-10-CM | POA: Diagnosis present

## 2016-10-26 DIAGNOSIS — D649 Anemia, unspecified: Secondary | ICD-10-CM | POA: Diagnosis present

## 2016-10-26 DIAGNOSIS — I509 Heart failure, unspecified: Secondary | ICD-10-CM

## 2016-10-26 DIAGNOSIS — E1122 Type 2 diabetes mellitus with diabetic chronic kidney disease: Secondary | ICD-10-CM | POA: Diagnosis present

## 2016-10-26 DIAGNOSIS — R109 Unspecified abdominal pain: Secondary | ICD-10-CM

## 2016-10-26 DIAGNOSIS — N183 Chronic kidney disease, stage 3 unspecified: Secondary | ICD-10-CM | POA: Diagnosis present

## 2016-10-26 DIAGNOSIS — R0602 Shortness of breath: Secondary | ICD-10-CM | POA: Diagnosis not present

## 2016-10-26 DIAGNOSIS — I5043 Acute on chronic combined systolic (congestive) and diastolic (congestive) heart failure: Secondary | ICD-10-CM | POA: Diagnosis present

## 2016-10-26 DIAGNOSIS — Z79899 Other long term (current) drug therapy: Secondary | ICD-10-CM

## 2016-10-26 DIAGNOSIS — E349 Endocrine disorder, unspecified: Secondary | ICD-10-CM

## 2016-10-26 DIAGNOSIS — I429 Cardiomyopathy, unspecified: Secondary | ICD-10-CM

## 2016-10-26 DIAGNOSIS — O021 Missed abortion: Secondary | ICD-10-CM

## 2016-10-26 DIAGNOSIS — O209 Hemorrhage in early pregnancy, unspecified: Secondary | ICD-10-CM

## 2016-10-26 DIAGNOSIS — J9600 Acute respiratory failure, unspecified whether with hypoxia or hypercapnia: Secondary | ICD-10-CM | POA: Diagnosis present

## 2016-10-26 DIAGNOSIS — I5022 Chronic systolic (congestive) heart failure: Secondary | ICD-10-CM | POA: Diagnosis present

## 2016-10-26 DIAGNOSIS — I428 Other cardiomyopathies: Secondary | ICD-10-CM | POA: Diagnosis present

## 2016-10-26 DIAGNOSIS — J9601 Acute respiratory failure with hypoxia: Secondary | ICD-10-CM | POA: Diagnosis present

## 2016-10-26 DIAGNOSIS — Z7984 Long term (current) use of oral hypoglycemic drugs: Secondary | ICD-10-CM

## 2016-10-26 DIAGNOSIS — I5021 Acute systolic (congestive) heart failure: Secondary | ICD-10-CM | POA: Diagnosis not present

## 2016-10-26 DIAGNOSIS — N189 Chronic kidney disease, unspecified: Secondary | ICD-10-CM

## 2016-10-26 DIAGNOSIS — O2341 Unspecified infection of urinary tract in pregnancy, first trimester: Secondary | ICD-10-CM

## 2016-10-26 DIAGNOSIS — R609 Edema, unspecified: Secondary | ICD-10-CM

## 2016-10-26 DIAGNOSIS — I5032 Chronic diastolic (congestive) heart failure: Secondary | ICD-10-CM | POA: Diagnosis not present

## 2016-10-26 DIAGNOSIS — I959 Hypotension, unspecified: Secondary | ICD-10-CM | POA: Diagnosis not present

## 2016-10-26 DIAGNOSIS — Z794 Long term (current) use of insulin: Secondary | ICD-10-CM

## 2016-10-26 DIAGNOSIS — R0689 Other abnormalities of breathing: Secondary | ICD-10-CM | POA: Diagnosis not present

## 2016-10-26 DIAGNOSIS — E119 Type 2 diabetes mellitus without complications: Secondary | ICD-10-CM

## 2016-10-26 DIAGNOSIS — O10911 Unspecified pre-existing hypertension complicating pregnancy, first trimester: Secondary | ICD-10-CM

## 2016-10-26 DIAGNOSIS — O24111 Pre-existing diabetes mellitus, type 2, in pregnancy, first trimester: Secondary | ICD-10-CM

## 2016-10-26 DIAGNOSIS — R0600 Dyspnea, unspecified: Secondary | ICD-10-CM | POA: Diagnosis not present

## 2016-10-26 DIAGNOSIS — IMO0001 Reserved for inherently not codable concepts without codable children: Secondary | ICD-10-CM | POA: Diagnosis present

## 2016-10-26 DIAGNOSIS — O26899 Other specified pregnancy related conditions, unspecified trimester: Secondary | ICD-10-CM

## 2016-10-26 DIAGNOSIS — R0902 Hypoxemia: Secondary | ICD-10-CM

## 2016-10-26 LAB — ECHOCARDIOGRAM COMPLETE
CHL CUP DOP CALC LVOT VTI: 16.5 cm
E/e' ratio: 14.11
FS: 14 % — AB (ref 28–44)
Height: 57 in
IVS/LV PW RATIO, ED: 1.13
LA diam index: 2.04 cm/m2
LASIZE: 34.4 mm
LDCA: 2.67 cm2
LEFT ATRIUM END SYS DIAM: 34.4 mm
LV E/e' medial: 14.11
LV E/e'average: 14.11
LV e' LATERAL: 11.2 cm/s
LVOT diameter: 18.5 mm
LVOTSV: 44.1 mL
Lateral S' vel: 12 cm/s
MVPG: 10 mmHg
MVPKEVEL: 158 m/s
PW: 11.2 mm — AB (ref 0.6–1.1)
TDI e' lateral: 11.2
TDI e' medial: 10.8
Weight: 3392 oz

## 2016-10-26 LAB — BLOOD GAS, VENOUS
Acid-base deficit: 4.1 mmol/L — ABNORMAL HIGH (ref 0.0–2.0)
BICARBONATE: 21.4 mmol/L (ref 20.0–28.0)
O2 Content: 2 L/min
O2 Saturation: 97.4 %
PH VEN: 7.315 (ref 7.250–7.430)
Patient temperature: 98.6
pCO2, Ven: 43.2 mmHg — ABNORMAL LOW (ref 44.0–60.0)
pO2, Ven: 111 mmHg — ABNORMAL HIGH (ref 32.0–45.0)

## 2016-10-26 LAB — CREATININE, SERUM
Creatinine, Ser: 1.71 mg/dL — ABNORMAL HIGH (ref 0.44–1.00)
GFR, EST AFRICAN AMERICAN: 45 mL/min — AB (ref >60)
GFR, EST NON AFRICAN AMERICAN: 39 mL/min — AB (ref >60)

## 2016-10-26 LAB — CBC WITH DIFFERENTIAL/PLATELET
Basophils Absolute: 0 10*3/uL (ref 0.0–0.1)
Basophils Relative: 0 %
EOS PCT: 0 %
Eosinophils Absolute: 0 10*3/uL (ref 0.0–0.7)
HEMATOCRIT: 34 % — AB (ref 36.0–46.0)
HEMOGLOBIN: 10.5 g/dL — AB (ref 12.0–15.0)
Lymphocytes Relative: 10 %
Lymphs Abs: 0.7 10*3/uL (ref 0.7–4.0)
MCH: 19.1 pg — ABNORMAL LOW (ref 26.0–34.0)
MCHC: 30.9 g/dL (ref 30.0–36.0)
MCV: 61.7 fL — AB (ref 78.0–100.0)
MONOS PCT: 5 %
Monocytes Absolute: 0.3 10*3/uL (ref 0.1–1.0)
NEUTROS ABS: 5.9 10*3/uL (ref 1.7–7.7)
Neutrophils Relative %: 85 %
Platelets: 493 10*3/uL — ABNORMAL HIGH (ref 150–400)
RBC: 5.51 MIL/uL — ABNORMAL HIGH (ref 3.87–5.11)
RDW: 16.2 % — AB (ref 11.5–15.5)
WBC: 6.9 10*3/uL (ref 4.0–10.5)

## 2016-10-26 LAB — GLUCOSE, CAPILLARY: Glucose-Capillary: 160 mg/dL — ABNORMAL HIGH (ref 65–99)

## 2016-10-26 LAB — I-STAT CHEM 8, ED
BUN: 20 mg/dL (ref 6–20)
CALCIUM ION: 1.11 mmol/L — AB (ref 1.15–1.40)
CREATININE: 1.8 mg/dL — AB (ref 0.44–1.00)
Chloride: 108 mmol/L (ref 101–111)
GLUCOSE: 223 mg/dL — AB (ref 65–99)
HCT: 35 % — ABNORMAL LOW (ref 36.0–46.0)
HEMOGLOBIN: 11.9 g/dL — AB (ref 12.0–15.0)
Potassium: 3.8 mmol/L (ref 3.5–5.1)
Sodium: 140 mmol/L (ref 135–145)
TCO2: 23 mmol/L (ref 0–100)

## 2016-10-26 LAB — I-STAT BETA HCG BLOOD, ED (MC, WL, AP ONLY)

## 2016-10-26 LAB — CBC
HCT: 33.1 % — ABNORMAL LOW (ref 36.0–46.0)
Hemoglobin: 10.1 g/dL — ABNORMAL LOW (ref 12.0–15.0)
MCH: 19.1 pg — ABNORMAL LOW (ref 26.0–34.0)
MCHC: 30.5 g/dL (ref 30.0–36.0)
MCV: 62.7 fL — ABNORMAL LOW (ref 78.0–100.0)
Platelets: 499 K/uL — ABNORMAL HIGH (ref 150–400)
RBC: 5.28 MIL/uL — ABNORMAL HIGH (ref 3.87–5.11)
RDW: 16 % — ABNORMAL HIGH (ref 11.5–15.5)
WBC: 5.8 K/uL (ref 4.0–10.5)

## 2016-10-26 LAB — TROPONIN I
Troponin I: 0.06 ng/mL (ref <0.03)
Troponin I: 0.06 ng/mL (ref <0.03)

## 2016-10-26 LAB — CBG MONITORING, ED
GLUCOSE-CAPILLARY: 234 mg/dL — AB (ref 65–99)
Glucose-Capillary: 192 mg/dL — ABNORMAL HIGH (ref 65–99)

## 2016-10-26 LAB — MRSA PCR SCREENING: MRSA BY PCR: NEGATIVE

## 2016-10-26 LAB — BRAIN NATRIURETIC PEPTIDE: B Natriuretic Peptide: 867.1 pg/mL — ABNORMAL HIGH (ref 0.0–100.0)

## 2016-10-26 LAB — TSH: TSH: 4.157 u[IU]/mL (ref 0.350–4.500)

## 2016-10-26 LAB — INFLUENZA PANEL BY PCR (TYPE A & B)
Influenza A By PCR: NEGATIVE
Influenza B By PCR: NEGATIVE

## 2016-10-26 LAB — I-STAT TROPONIN, ED: TROPONIN I, POC: 0.09 ng/mL — AB (ref 0.00–0.08)

## 2016-10-26 MED ORDER — INSULIN ASPART 100 UNIT/ML ~~LOC~~ SOLN
0.0000 [IU] | Freq: Three times a day (TID) | SUBCUTANEOUS | Status: DC
  Administered 2016-10-26: 3 [IU] via SUBCUTANEOUS
  Administered 2016-10-26: 2 [IU] via SUBCUTANEOUS
  Administered 2016-10-27: 1 [IU] via SUBCUTANEOUS
  Administered 2016-10-27: 2 [IU] via SUBCUTANEOUS
  Administered 2016-10-27 – 2016-10-29 (×6): 1 [IU] via SUBCUTANEOUS
  Administered 2016-10-29: 2 [IU] via SUBCUTANEOUS
  Administered 2016-10-30: 1 [IU] via SUBCUTANEOUS
  Administered 2016-10-30: 2 [IU] via SUBCUTANEOUS
  Administered 2016-10-30: 1 [IU] via SUBCUTANEOUS
  Administered 2016-10-31 – 2016-11-02 (×7): 2 [IU] via SUBCUTANEOUS
  Administered 2016-11-02: 3 [IU] via SUBCUTANEOUS
  Administered 2016-11-02 – 2016-11-03 (×2): 2 [IU] via SUBCUTANEOUS
  Administered 2016-11-03 – 2016-11-04 (×3): 3 [IU] via SUBCUTANEOUS
  Administered 2016-11-04 (×2): 2 [IU] via SUBCUTANEOUS
  Administered 2016-11-05 (×2): 3 [IU] via SUBCUTANEOUS
  Administered 2016-11-05 – 2016-11-06 (×2): 2 [IU] via SUBCUTANEOUS
  Administered 2016-11-06: 3 [IU] via SUBCUTANEOUS
  Administered 2016-11-06: 5 [IU] via SUBCUTANEOUS
  Administered 2016-11-07 – 2016-11-08 (×5): 3 [IU] via SUBCUTANEOUS
  Filled 2016-10-26 (×3): qty 1

## 2016-10-26 MED ORDER — AMOXICILLIN-POT CLAVULANATE 500-125 MG PO TABS
1.0000 | ORAL_TABLET | Freq: Two times a day (BID) | ORAL | Status: DC
  Administered 2016-10-26 – 2016-10-27 (×3): 500 mg via ORAL
  Filled 2016-10-26 (×4): qty 1

## 2016-10-26 MED ORDER — FUROSEMIDE 10 MG/ML IJ SOLN
20.0000 mg | Freq: Once | INTRAMUSCULAR | Status: AC
  Administered 2016-10-26: 20 mg via INTRAVENOUS
  Filled 2016-10-26: qty 4

## 2016-10-26 MED ORDER — SODIUM CHLORIDE 0.9% FLUSH
3.0000 mL | INTRAVENOUS | Status: DC | PRN
  Administered 2016-10-27: 3 mL via INTRAVENOUS
  Filled 2016-10-26: qty 3

## 2016-10-26 MED ORDER — ONDANSETRON HCL 4 MG/2ML IJ SOLN
4.0000 mg | Freq: Four times a day (QID) | INTRAMUSCULAR | Status: DC | PRN
  Administered 2016-10-29: 4 mg via INTRAVENOUS
  Filled 2016-10-26: qty 2

## 2016-10-26 MED ORDER — HYDRALAZINE HCL 20 MG/ML IJ SOLN: 10.0000 mg | INTRAMUSCULAR | Status: DC | PRN

## 2016-10-26 MED ORDER — SODIUM CHLORIDE 0.9 % IV SOLN: 250.0000 mL | INTRAVENOUS | Status: DC | PRN

## 2016-10-26 MED ORDER — NITROGLYCERIN 2 % TD OINT
0.5000 [in_us] | TOPICAL_OINTMENT | Freq: Once | TRANSDERMAL | Status: AC
  Administered 2016-10-26: 0.5 [in_us] via TOPICAL
  Filled 2016-10-26: qty 1

## 2016-10-26 MED ORDER — IPRATROPIUM-ALBUTEROL 0.5-2.5 (3) MG/3ML IN SOLN
3.0000 mL | Freq: Once | RESPIRATORY_TRACT | Status: AC
  Administered 2016-10-26: 3 mL via RESPIRATORY_TRACT
  Filled 2016-10-26: qty 3

## 2016-10-26 MED ORDER — ACETAMINOPHEN 325 MG PO TABS: 650.0000 mg | ORAL_TABLET | ORAL | Status: DC | PRN

## 2016-10-26 MED ORDER — HEPARIN SODIUM (PORCINE) 5000 UNIT/ML IJ SOLN
5000.0000 [IU] | Freq: Three times a day (TID) | INTRAMUSCULAR | Status: DC
  Administered 2016-10-27 – 2016-11-08 (×38): 5000 [IU] via SUBCUTANEOUS
  Filled 2016-10-26 (×40): qty 1

## 2016-10-26 MED ORDER — METHYLDOPA 250 MG PO TABS
250.0000 mg | ORAL_TABLET | Freq: Two times a day (BID) | ORAL | Status: DC
  Administered 2016-10-26 – 2016-10-27 (×3): 250 mg via ORAL
  Filled 2016-10-26 (×4): qty 1

## 2016-10-26 MED ORDER — IPRATROPIUM-ALBUTEROL 0.5-2.5 (3) MG/3ML IN SOLN: 3.0000 mL | Freq: Once | RESPIRATORY_TRACT | Status: DC

## 2016-10-26 MED ORDER — SODIUM CHLORIDE 0.9% FLUSH
3.0000 mL | Freq: Two times a day (BID) | INTRAVENOUS | Status: DC
  Administered 2016-10-26: 3 mL via INTRAVENOUS

## 2016-10-26 NOTE — Progress Notes (Signed)
  Echocardiogram 2D Echocardiogram has been performed.  Leta Jungling M 10/26/2016, 1:43 PM

## 2016-10-26 NOTE — ED Notes (Signed)
Bed: PN36 Expected date:  Expected time:  Means of arrival:  Comments: 31yo shortness of breath on NRB mask.

## 2016-10-26 NOTE — ED Notes (Signed)
Pt went straight to bathroom from Echo. Unable to medicate.

## 2016-10-26 NOTE — Progress Notes (Signed)
  CM spoke with pt who confirms uninsured [employer] resident with no pcp.  CM discussed and provided written information to assist pt with determining choice for uninsured accepting pcps, discussed the importance of pcp vs EDP services for f/u care, www.needymeds.org, www.goodrx.com, discounted pharmacies and other [employer] such as [facility] , [employer], affordable care act, financial assistance, uninsured dental services, [place] med assist, DSS and  health department  Reviewed resources for [employer] uninsured accepting pcps like [facility], family medicine at [facility], [facility], [facility], local urgent care centers, [facility], [facility] family practice, general medical clinics, family services of the [place], [facility] urgent care plus others, medication resources, [facility] out patient pharmacies and housing Pt voiced understanding and appreciation of resources provided   Provided [facility] contact information   Entered in d/c instructions Please use the resources provided to you in emergency room by case manager to assist in your choice of doctor for follow up  These [employer] uninsured resources provide possible primary care providers, resources for discounted medications, housing, dental resources, affordable care act information, plus other resources for [employer]

## 2016-10-26 NOTE — H&P (Addendum)
[facility] H&P   Patient Demographics:    Lisa Hodges, is a 31 y.o. female  MRN: [id number]   DOB - [birth year]  Admit Date - 10/26/2016  Outpatient Primary MD for the patient is Lucretia Kern., DO  Referring MD/NP/PA: Dr Roxanne Mins  Patient coming from: Home  Chief Complaint  Patient presents with  . Shortness of Breath      HPI:    Lisa Hodges  is a 31 y.o. female, With past medical history of hypertension, diabetes mellitus, missed abortion last year, she presents with complaints of shortness of breath, worsening lower ischemic edema over last 4 weeks, she was diagnosed with pneumonia couple weeks ago, discharged on by mouth antibiotics, she still reports shortness of breath, with worsening lower extremity edema, which prompted her to come back in ED, she denies any fever, chills, productive cough or sputum, or chest pain ,  she feels some fluid weight gain, she denies any chest pain, - In ED workup significant for return of 1.8, troponin of 0.06, BNP of 867 , chest x-ray significant for worsening multifocal pneumonia and small pleural effusion     Review of systems:    In addition to the HPI above, No Fever-chills, No Headache, No changes with Vision or hearing, No problems swallowing food or Liquids, No Chest pain, CoughReports worsening Shortness of Breath, as well worsening lower extremity edema No Abdominal pain, No Nausea or Vommitting, Bowel movements are regular, No Blood in stool or Urine, No dysuria, No new skin rashes or bruises, No new joints pains-aches,  No new weakness, tingling, numbness in any extremity, No recent weight gain or loss, No polyuria, polydypsia or polyphagia, No significant Mental Stressors.  A full 10 point Review of Systems was done, except as stated above, all other Review of Systems were negative.   With Past History of the following :     Past Medical History:  Diagnosis Date  . Diabetes mellitus without complication ([place])   . Hypertension       Past Surgical History:  Procedure Laterality Date  . CESAREAN SECTION        Social History:     Social History  Substance Use Topics  . Smoking status: Never Smoker  . Smokeless tobacco: Never Used  . Alcohol use No     Lives - At home  Mobility - independent     Family History :   Significant for CVA in grandmother, otherwise denies any significant cardiac history   Home Medications:   Prior to Admission medications   Medication Sig Start Date End Date Taking? Authorizing Provider  hydrochlorothiazide (HYDRODIURIL) 25 MG tablet Take 1 tablet (25 mg total) by mouth daily. 10/12/16  Yes Tiffany Carlota Raspberry, PA-C  metFORMIN (GLUCOPHAGE) 500 MG tablet Take 1 tablet (500 mg total) by mouth 2 (two) times daily with a meal. 10/12/16  Yes Delos Haring, PA-C  Pseudoephedrine-APAP-DM (DAYQUIL PO) Take 30 mLs by mouth as needed (cold).   Yes Historical Provider, MD  azithromycin (ZITHROMAX) 250 MG tablet Take 1 tablet (250 mg total) by mouth daily. Take first 2 tablets together, then 1 every day until finished. Patient not taking: Reported on 10/26/2016 10/12/16   Delos Haring, PA-C  glucose monitoring kit (FREESTYLE) monitoring kit 1 each by Does not apply route as needed for other. 09/01/14   Nicole Pisciotta, PA-C  oxyCODONE-acetaminophen (PERCOCET/ROXICET) 5-325 MG tablet Take 1-2 tablets by mouth once. Patient not taking: Reported on 10/26/2016 10/26/15   Manya Silvas, CNM  promethazine (PHENERGAN) 25 MG tablet Take 1 tablet (25 mg total) by mouth every 6 (six) hours as needed. Patient not taking: Reported on 10/26/2016 10/26/15   Manya Silvas, CNM  sulfamethoxazole-trimethoprim (BACTRIM DS,SEPTRA DS) 800-160 MG tablet Take 1 tablet by mouth 2 (two) times daily. Patient not taking: Reported on 10/26/2016 10/26/15   Manya Silvas, CNM     Allergies:    No Known  Allergies   Physical Exam:   Vitals  Blood pressure 144/88, pulse 114, temperature 98.2 F (36.8 C), temperature source Oral, resp. rate (!) 30, height 4' 9"  (1.448 m), weight 96.2 kg (212 lb), last menstrual period 10/23/2016, SpO2 100 %, not currently breastfeeding.   1. General Obese female lying in bed in NAD,    2. Normal affect and insight, Not Suicidal or Homicidal, Awake Alert, Oriented X 3.  3. No F.N deficits, ALL C.Nerves Intact, Strength 5/5 all 4 extremities, Sensation intact all 4 extremities, Plantars down going.  4. Ears and Eyes appear Normal, Conjunctivae clear, PERRLA. Moist Oral Mucosa.  5. Supple Neck, No JVD, No cervical lymphadenopathy appriciated, No Carotid Bruits.  6. Symmetrical Chest wall movement, Good air movement bilaterally, scattered rhonchi.  7. Tachycardic, No Gallops, Rubs or Murmurs, No Parasternal Heave. +3 lower extremity edema  8. Positive Bowel Sounds, Abdomen Soft, No tenderness, No organomegaly appriciated,No rebound -guarding or rigidity.  9.  No Cyanosis, Normal Skin Turgor, No Skin Rash or Bruise.  10. Good muscle tone,  joints appear normal , no effusions, Normal ROM.  11. No Palpable Lymph Nodes in Neck or Axillae     Data Review:    CBC  Recent Labs Lab 10/26/16 0249 10/26/16 0307  WBC 6.9  --   HGB 10.5* 11.9*  HCT 34.0* 35.0*  PLT 493*  --   MCV 61.7*  --   MCH 19.1*  --   MCHC 30.9  --   RDW 16.2*  --   LYMPHSABS 0.7  --   MONOABS 0.3  --   EOSABS 0.0  --   BASOSABS 0.0  --    ------------------------------------------------------------------------------------------------------------------  Chemistries   Recent Labs Lab 10/26/16 0307  NA 140  K 3.8  CL 108  GLUCOSE 223*  BUN 20  CREATININE 1.80*   ------------------------------------------------------------------------------------------------------------------ estimated creatinine clearance is 43.6 mL/min (by C-G formula based on SCr of 1.8 mg/dL  (H)). ------------------------------------------------------------------------------------------------------------------  Recent Labs  10/26/16 0249  TSH 4.157    Coagulation profile No results for input(s): INR, PROTIME in the last 168 hours. ------------------------------------------------------------------------------------------------------------------- No results for input(s): DDIMER in the last 72 hours. -------------------------------------------------------------------------------------------------------------------  Cardiac Enzymes  Recent Labs Lab 10/26/16 0819  TROPONINI 0.06*   ------------------------------------------------------------------------------------------------------------------    Component Value Date/Time   BNP 867.1 (H) 10/26/2016 0249     ---------------------------------------------------------------------------------------------------------------  Urinalysis    Component Value Date/Time   COLORURINE RED (A) 10/26/2015 1355  APPEARANCEUR CLOUDY (A) 10/26/2015 1355   LABSPEC 1.020 10/26/2015 1355   PHURINE 6.5 10/26/2015 1355   GLUCOSEU 500 (A) 10/26/2015 1355   HGBUR LARGE (A) 10/26/2015 1355   BILIRUBINUR NEGATIVE 10/26/2015 1355   KETONESUR NEGATIVE 10/26/2015 1355   PROTEINUR >300 (A) 10/26/2015 1355   UROBILINOGEN 0.2 09/01/2014 2124   NITRITE POSITIVE (A) 10/26/2015 1355   LEUKOCYTESUR NEGATIVE 10/26/2015 1355    ----------------------------------------------------------------------------------------------------------------   Imaging Results:    Dg Chest 2 View  Result Date: 10/26/2016 CLINICAL DATA:  Follow-up pneumonia. Shortness of breath for 2 days, worsening today. Hypertensive. History diabetes. EXAM: CHEST  2 VIEW COMPARISON:  Chest radiograph October 12, 2016 FINDINGS: Worsening multifocal consolidation. Small LEFT pleural effusion. Mild cardiomegaly even with consideration to this low inspiratory portable examination  with crowded vascular markings. No pneumothorax. Soft tissue planes and included osseous structures are nonsuspicious. IMPRESSION: Worsening multifocal pneumonia.  Small pleural effusion. Stable cardiomegaly. Electronically Signed   By: Elon Alas M.D.   On: 10/26/2016 03:20    My personal review of EKG: Rhythm NSR, Rate  116 /min, QTc 512, no Acute ST changes   Assessment & Plan:    Active Problems:   Acute respiratory failure (HCC)   CHF (congestive heart failure) (HCC)   AKI (acute kidney injury) ([place])   Diabetes ([place])   Hypertension   Positive urine pregnancy test   Acute hypoxic respiratory failure - patient  Oxygen saturation in the mid 80s on room air on presentation, volume overload versus infectious process. - Did not improve on Z-Pak as outpatient, Will start on Augmentin during hospital stay for presumed CAP , will check influenza and resp panel. - Continue with oxygen, wean as tolerated  CHF - Presents with significant clinical evidence of volume overload, on chest x-ray and worsening over approximately edema, no previous diagnosis of CHF. - Will obtain 2-D echo for further evaluation, cardiology consulted. - Received 20 mg IV Lasix in ED, will hold on further diuresis given worsening renal function. - Patient with positive troponins at 0.06, continue to monitor on telemetry, follow on echo, she denies any chest pain  Hypertension - Blood pressure uncontrolled on presentation, hold hydrochlorothiazide given worsening renal function , and will start on methyldopa, currently will avoid beta blockers given possible acute decompensated CHF.  Diabetes mellitus - Hold metformin, continue with insulin sliding scale  AKI - Admit to hold hydrochlorothiazide, recheck BMP in a.m.     DVT Prophylaxis Heparin - SCDs  AM Labs Ordered, also please review Full Orders  Family Communication: Admission, patients condition and plan of care including tests being ordered have  been discussed with the patient who indicate understanding and agree with the plan and Code Status.  Code Status Full  Likely DC to Home  Condition GUARDED    Consults called: cards  Admission status: inpatient  Time spent in minutes : 65 minutes   Jeannifer Drakeford M.D on 10/26/2016 at 10:20 AM  Between 7am to 7pm - Pager - [phone]. After 7pm go to [email or web address] - password [facility]  [facility] - Office  [phone]

## 2016-10-26 NOTE — ED Notes (Signed)
Echocardiogram being performed at bedside. Will administer medications when exam is complete.

## 2016-10-26 NOTE — ED Notes (Signed)
Pt alert and oriented x 4 . Pt denies chest pain at this time. Pt BP 189/131

## 2016-10-26 NOTE — Consult Note (Signed)
Cardiology Consultation Note    Patient ID: Lisa Hodges, MRN: [id number], DOB/AGE: 12/10/1985 31 y.o. Admit date: 10/26/2016   Date of Consult: 10/26/2016 Primary Physician: Lucretia Kern., DO Primary Cardiologist: New  Chief Complaint: Dyspnea Reason for Consultation: Dyspnea in the setting of elevated Tn Requesting MD: ED  HPI: Lisa Hodges is a 31 y.o. female with history of HTM and DM managed with Metformin who presents to the [facility] for ongoing Dyspnea. She was seen on 1/18 and diagnosed with CAP, discharged with azithromycin. Patient states that she completed the course of abx with no relief of symptoms.   Patient has no cardiologist on file, and has no prior cardiac work-up history. She is unsure of her immediate family history.   The patient states her symptoms are no better or worse then when she presented on 1/18. She returned to see if "I still had PNA". Her main complaint of dyspnea occurs with minimal activity that is associated with lightheadedness, palpitations and "feeling like I am going to pass out". The patient has not had any syncopal episodes. The dyspnea abates with rest. Dyspnea is also brought on with talking and when she coughs. Her cough is productive with green sputum, no hemoptysis. There is no temporality associated with the cough and it is no related to eating.  She admits to orthopnea, with a 1 pillow increase, and PND. She also states she has LLE, which is chronic for some time, but she is unsure how long this has been. She has tried DayQuil for her symptoms with minimal relief. She does not endorse any past or current chest pain.   In the emergency department labs were drawn including: Cr 1.8, Tn 0.09 (poc) and 0.06, BNP 867.1. CXR showed worsening multifocal PNA, small pleural effusions, and stable cardiomegaly. ECG showed sinus tachycardia, 116, right axis deviation, and prolonged QTc (512). The patient blood gas pH was normal with normal bicarb, elevated PO2 (111),  decreased PCO2 (43.2) and acid base increased to 4.1. She was given a breathing treatment x1 and Lasix 34m, which she feels helped relieve her symptoms. Nitroglycerin 2% ointment applied, patient is unsure if this helped.   She denies fever, chills, muscle aches, coryza, rhinorrhea, ST, post nasal drip, sneezing, chest pain, diarrhea, vomiting.   Past Medical History:  Diagnosis Date  . Diabetes mellitus without complication ([place]   . Hypertension       Surgical History:  Past Surgical History:  Procedure Laterality Date  . CESAREAN SECTION       Home Meds: Medication Sig Start Date  hydrochlorothiazide (HYDRODIURIL) 25 MG tablet Take 1 tablet (25 mg total) by mouth daily. 10/12/16  metFORMIN (GLUCOPHAGE) 500 MG tablet Take 1 tablet (500 mg total) by mouth 2 (two) times daily with a meal. 10/12/16  Pseudoephedrine-APAP-DM (DAYQUIL PO) Take 30 mLs by mouth as needed (cold).   azithromycin (ZITHROMAX) 250 MG tablet Take 1 tablet (250 mg total) by mouth daily. Take first 2 tablets together, then 1 every day until finished. Patient not taking: Reported on 10/26/2016 10/12/16  glucose monitoring kit (FREESTYLE) monitoring kit 1 each by Does not apply route as needed for other. 09/01/14  oxyCODONE-acetaminophen (PERCOCET/ROXICET) 5-325 MG tablet Take 1-2 tablets by mouth once. Patient not taking: Reported on 10/26/2016 10/26/15  promethazine (PHENERGAN) 25 MG tablet Take 1 tablet (25 mg total) by mouth every 6 (six) hours as needed. Patient not taking: Reported on 10/26/2016 10/26/15  sulfamethoxazole-trimethoprim (BACTRIM DS,SEPTRA DS) 800-160 MG tablet Take  1 tablet by mouth 2 (two) times daily. Patient not taking: Reported on 10/26/2016 10/26/15    Allergies: No Known Allergies  Social History   Social History  . Marital status: Single    Spouse name: N/A  . Number of children: N/A  . Years of education: N/A   Occupational History  . Not on file.   Social History Main Topics  . Smoking  status: Never Smoker  . Smokeless tobacco: Never Used  . Alcohol use No  . Drug use: No  . Sexual activity: Not on file   Other Topics Concern  . Not on file   Social History Narrative  . No narrative on file     History reviewed. No pertinent family history.   Review of Systems: General: negative for chills, fever, night sweats or weight changes.  Cardiovascular: Positive for shortness of breath, dyspnea on exertion, orthopnea, palpitations, PND and dyspnea. Negative for chest pain.  Dermatological: negative for rash Respiratory: Positive for cough or wheezing Urologic: negative for hematuria Abdominal: Positive for nausea. Negative vomiting, diarrhea, bright red blood per rectum, melena, or hematemesis Neurologic: negative for visual changes, syncope, or dizziness All other systems reviewed and are otherwise negative except as noted above.  Labs:  Recent Labs  10/26/16 0819  TROPONINI 0.06*   Lab Results  Component Value Date   WBC 6.9 10/26/2016   HGB 11.9 (L) 10/26/2016   HCT 35.0 (L) 10/26/2016   MCV 61.7 (L) 10/26/2016   PLT 493 (H) 10/26/2016    Recent Labs Lab 10/26/16 0307  NA 140  K 3.8  CL 108  BUN 20  CREATININE 1.80*  GLUCOSE 223*   No results found for: CHOL, HDL, LDLCALC, TRIG No results found for: DDIMER  Radiology/Studies:  Dg Chest 2 View  Result Date: 10/26/2016 CLINICAL DATA:  Follow-up pneumonia. Shortness of breath for 2 days, worsening today. Hypertensive. History diabetes. EXAM: CHEST  2 VIEW COMPARISON:  Chest radiograph October 12, 2016 FINDINGS: Worsening multifocal consolidation. Small LEFT pleural effusion. Mild cardiomegaly even with consideration to this low inspiratory portable examination with crowded vascular markings. No pneumothorax. Soft tissue planes and included osseous structures are nonsuspicious. IMPRESSION: Worsening multifocal pneumonia.  Small pleural effusion. Stable cardiomegaly. Electronically Signed   By: Elon Alas M.D.   On: 10/26/2016 03:20   Dg Chest 2 View  Result Date: 10/12/2016 CLINICAL DATA:  Productive cough.  Chest pain.  Dyspnea. EXAM: CHEST  2 VIEW COMPARISON:  None. FINDINGS: Low lung volumes. Top-normal heart size. Normal mediastinal contour. No pneumothorax. Trace right and small left pleural effusions. No pulmonary edema. Patchy consolidation in both lower lobes, left greater than right. IMPRESSION: 1. Patchy bilateral lower lobe consolidation, left greater than right, suspicious for multilobar pneumonia. 2. Small left and trace right pleural effusions. 3. Recommend follow-up PA and lateral post treatment chest radiographs in 4-6 weeks. Electronically Signed   By: Ilona Sorrel M.D.   On: 10/12/2016 15:52    Wt Readings from Last 3 Encounters:  10/26/16 96.2 kg (212 lb)  10/12/16 96.2 kg (212 lb)  10/26/15 81.8 kg (180 lb 6.4 oz)    EKG:  Sinus tachycardia, 116, right axis deviation, and prolonged QTc (512  Physical Exam: Blood pressure 135/89, pulse 114, temperature 98.2 F (36.8 C), temperature source Oral, resp. rate 18, height _0  (1.448 m), weight 96.2 kg (212 lb), last menstrual period 10/23/2016, SpO2 97 %, not currently breastfeeding. Body mass index is 45.88 kg/m. General: Well  developed, obese female, in mild respiratory distress Head: Normocephalic, atraumatic, sclera non-icteric, no xanthomas, nares are without discharge.  Neck: Negative for carotid bruits. Unable to assess JVD d/t body habitus Lungs: Coarse rhonchi with decreased breath sounds at bilateral bases. No wheezes, rales.  Heart: Tachycardic with regular Rhythm. S1 S2 heard. No murmurs, rubs, or gallops appreciated. Abdomen: Soft, non-tender, non-distended with normoactive bowel sounds. No hepatomegaly. No rebound/guarding. No obvious abdominal masses. Msk:  Strength and tone appear normal for age. Extremities: 3+ pitting edema bilaterally. Negative homans. No clubbing or cyanosis. Distal pedal pulses  are 2+ and equal bilaterally. Neuro: Alert and oriented X 3. No facial asymmetry. No focal deficit. Moves all extremities spontaneously. Psych:  Responds to questions appropriately with a normal affect.     Assessment and [place] is a 31 y.o. female with history of HTM and DM managed with Metformin who presents to the [facility] for ongoing dyspnea. She was seen on 1/18 and diagnosed with CAP, discharged with azithromycin. Patient states that she completed the course of abx with no relief of symptoms.   1. Community Acquired PNA  Patient diagnosed with CAP 10/12/16 and d/c with azithromycin. Completed course of abx with no relief of symptoms. Returning to ER for ongoing dysnpea that occurs with minimal activity, talking, coughing. Will defer CAP tx to IM.   2. Acute Heart Failure with unknown etiology  CXR showed worsening multifocal PNA, small pleural effusions, and stable cardiomegaly. She admits to orthopnea, PND, and LLE. No chest pain.  BNP 867.1, Tn 0.09 (poc) and 0.06. 3+ pitting edema on exam with adventitious lung sounds and decrease breath sounds at bilateral bases. Patient is on HCTZ 72m at home. Will hold for now and start on lasix and BB. Obtain Echo. Will not start ACE/ARB currently due tor renal insufficiency with undetermined chronicity (Cr 1.8). Suggest use of Levalbuterol for future breathing treatments due to patients tachycardia. Patient will need strict I/O.  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   3. Renal insufficiency with undetermined chronicity.  On 09/01/14, Cr 0.81. Cr 1/18 1.71. Today 1.8. Suspect chronic kidney disease.   4. Prolonged QTc Suspect this is in the setting of Azithromycin use.   5. HTN:  No PCP, states she is managed by refill from ED. Need for establish care with PCP for chronic management and follow-up. Came to the ED with elevated readings as high as 189/111. Last reading 144/88. See above for medication management + PRN Hydralazine. [place]  added by Primary Service.   6. DM:  No PCP, states she is managed by refill from ED. Need for establish care with PCP for chronic management and follow-up. On Metformin 5075mBID at home. Obtain Lipid Panel. No documented results on file. Defer inpatient treatment to primary.   SiAngelena Hodges 10/26/2016 10:45 AM   History and all data above reviewed.  Patient examined.  I agree with the findings as above. She presents with continued respiratory complaints thought to be related to pneumonia.  However, she has evidence of volume overload as well.  She is an unfortunate patient who has no health care and has untreated DM and HTN.  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  She has had progressive dyspnea with cough and PND.  She has had chronic lower extremity edema.   The patient exam reveals COR:RRR  ,  Lungs: Decreased breath sounds, S3 vs S4, PMI displaced  ,  Abd: Positive bowel sounds,  no rebound no guarding, Ext moderate edema   .  All available labs, radiology testing, previous records reviewed. Agree with documented assessment and plan. Respiratory failure:  She has evidence of CHF.  I suspect that this will be both systolic and diastolic.  There are multiple possible etiologies including hypertensive heart disease, virus, ischemic.  (Doubt the latter.) Will start with an echo in the ED to decide the best place for admission.  She will need IV diuresis with close follow up of her creatinine.  I agree with NTG paste for now for BP control.  She might tolerate ARB or ACE but will wait to start.  No beta blocker with acute decompensated CHF at this point.     Lisa Hodges  12:08 PM  10/26/2016

## 2016-10-26 NOTE — ED Triage Notes (Signed)
Pt from home c/o SOB x2 days and productive cough. Pt was diagnosed w/ pneumonia on 10/12/16. No hx of asthma

## 2016-10-26 NOTE — ED Notes (Signed)
BP rechecked post NTG Paste 154/109.

## 2016-10-26 NOTE — ED Notes (Signed)
Pt reports feeling palpitations, ED MD made aware and EKG will be done .

## 2016-10-26 NOTE — ED Notes (Signed)
Patient transported to X-ray 

## 2016-10-26 NOTE — ED Provider Notes (Signed)
[facility] DEPT Provider Note   CSN: [id number] Arrival date & time: 10/26/16  0047     History   Chief Complaint Chief Complaint  Patient presents with  . Shortness of Breath    HPI Lisa Hodges is a 31 y.o. female with past medical history of diabetes, hypertension, who presents via EMS for shortness of breath. The patient was seen in the emergency department on 10/12/2016. She was diagnosed with community-acquired pneumonia. Chest x-ray concerning for multilobar pneumonia at that time. She is discharged on azithromycin. Patient is a poor historian. However, she states that she was having some difficulty breathing. Of note, she has 3+ pitting edema of the bilateral lower extremities. Patient states that has been there for "a long time". She is unsure of how long. She admits to feeling like she has fluid weight gain. She denies chest pain but feels that she is extremely short of breath. She denies a history of oxygen dependence found to have oxygen saturations in the 80s.  HPI  Past Medical History:  Diagnosis Date  . Diabetes mellitus without complication ([place])   . Hypertension     Patient Active Problem List   Diagnosis Date Noted  . Uncontrolled type 2 diabetes mellitus with hyperglycemia, without long-term current use of insulin ([place]) 10/26/2015  . Elevated serum creatinine 10/26/2015    Past Surgical History:  Procedure Laterality Date  . CESAREAN SECTION      OB History    Gravida Para Term Preterm AB Living   2 1   1   1    SAB TAB Ectopic Multiple Live Births                   Home Medications    Prior to Admission medications   Medication Sig Start Date End Date Taking? Authorizing Provider  hydrochlorothiazide (HYDRODIURIL) 25 MG tablet Take 1 tablet (25 mg total) by mouth daily. 10/12/16  Yes Tiffany Carlota Raspberry, PA-C  metFORMIN (GLUCOPHAGE) 500 MG tablet Take 1 tablet (500 mg total) by mouth 2 (two) times daily with a meal. 10/12/16  Yes Tiffany Carlota Raspberry, PA-C   Pseudoephedrine-APAP-DM (DAYQUIL PO) Take 30 mLs by mouth as needed (cold).   Yes Historical Provider, MD  azithromycin (ZITHROMAX) 250 MG tablet Take 1 tablet (250 mg total) by mouth daily. Take first 2 tablets together, then 1 every day until finished. Patient not taking: Reported on 10/26/2016 10/12/16   Delos Haring, PA-C  glucose monitoring kit (FREESTYLE) monitoring kit 1 each by Does not apply route as needed for other. 09/01/14   Nicole Pisciotta, PA-C  oxyCODONE-acetaminophen (PERCOCET/ROXICET) 5-325 MG tablet Take 1-2 tablets by mouth once. Patient not taking: Reported on 10/26/2016 10/26/15   Manya Silvas, CNM  promethazine (PHENERGAN) 25 MG tablet Take 1 tablet (25 mg total) by mouth every 6 (six) hours as needed. Patient not taking: Reported on 10/26/2016 10/26/15   Manya Silvas, CNM  sulfamethoxazole-trimethoprim (BACTRIM DS,SEPTRA DS) 800-160 MG tablet Take 1 tablet by mouth 2 (two) times daily. Patient not taking: Reported on 10/26/2016 10/26/15   Manya Silvas, [place]    Family History History reviewed. No pertinent family history.  Social History Social History  Substance Use Topics  . Smoking status: Never Smoker  . Smokeless tobacco: Never Used  . Alcohol use No     Allergies   Patient has no known allergies.   Review of Systems Review of Systems   Physical Exam Updated Vital Signs BP 153/100 (BP Location: Left Arm)  Pulse 111   Resp (!) 28   Ht 4' 9"  (1.448 m)   Wt 96.2 kg   LMP 10/23/2016   SpO2 96%   Breastfeeding? No   BMI 45.88 kg/m   Physical Exam  Constitutional:  Short female with labored breathing and central obesity  HENT:  Head: Normocephalic and atraumatic.  Eyes: EOM are normal. Pupils are equal, round, and reactive to light.  Neck: Normal range of motion.  Unable to assess JVD scondary to body habitus  Cardiovascular: Exam reveals no gallop.   No murmur heard. 2+ pitting edema BL   Tachycardic   Pulmonary/Chest:  Tachypnea,  shallow breathing, increased effort Diffuse ronchi  Abdominal: She exhibits distension. She exhibits no mass. There is no tenderness. There is no guarding.  Neurological:  Sleepy  Nursing note and vitals reviewed.    ED Treatments / Results  Labs (all labs ordered are listed, but only abnormal results are displayed) Labs Reviewed  CBC WITH DIFFERENTIAL/PLATELET  BRAIN NATRIURETIC PEPTIDE  BLOOD GAS, VENOUS  I-STAT TROPOININ, ED  I-STAT BETA HCG BLOOD, ED ([facility], [facility], [facility] ONLY)  I-STAT CHEM 8, ED    EKG  EKG Interpretation None       Radiology No results found.  Procedures Procedures (including critical care time)  Medications Ordered in ED Medications - No data to display   Initial Impression / Assessment and Plan / ED Course  I have reviewed the triage vital signs and the nursing notes.  Pertinent labs & imaging results that were available during my care of the patient were reviewed by me and considered in my medical decision making (see chart for details).  Clinical Course as of Oct 26 320  Thu Oct 26, 2016  2863 Creatinine trending upward over the past year Creatinine: (!) 1.80 [AH]  0320 Troponin noted to be elevated Troponin i, poc: (!!) 0.09 [AH]    Clinical Course User Index [AH] Margarita Mail, PA-C   Patient with apparent acute chf exacerbation. Patient will be admitted .   Final Clinical Impressions(s) / ED Diagnoses   Final diagnoses:  Shortness of breath  Hypoxia  Peripheral edema    New Prescriptions New Prescriptions   No medications on file     Margarita Mail, PA-C 81/77/11 6579    David Glick, MD 03/83/33 8329    David Glick, MD 19/16/60 6004

## 2016-10-27 DIAGNOSIS — I429 Cardiomyopathy, unspecified: Secondary | ICD-10-CM

## 2016-10-27 LAB — BASIC METABOLIC PANEL
Anion gap: 10 (ref 5–15)
BUN: 19 mg/dL (ref 6–20)
CHLORIDE: 107 mmol/L (ref 101–111)
CO2: 22 mmol/L (ref 22–32)
CREATININE: 1.66 mg/dL — AB (ref 0.44–1.00)
Calcium: 8 mg/dL — ABNORMAL LOW (ref 8.9–10.3)
GFR calc Af Amer: 46 mL/min — ABNORMAL LOW (ref >60)
GFR calc non Af Amer: 40 mL/min — ABNORMAL LOW (ref >60)
Glucose, Bld: 172 mg/dL — ABNORMAL HIGH (ref 65–99)
Potassium: 3.6 mmol/L (ref 3.5–5.1)
SODIUM: 139 mmol/L (ref 135–145)

## 2016-10-27 LAB — RESPIRATORY PANEL BY PCR
Adenovirus: NOT DETECTED
BORDETELLA PERTUSSIS-RVPCR: NOT DETECTED
CORONAVIRUS HKU1-RVPPCR: NOT DETECTED
Chlamydophila pneumoniae: NOT DETECTED
Coronavirus 229E: NOT DETECTED
Coronavirus NL63: NOT DETECTED
Coronavirus OC43: NOT DETECTED
INFLUENZA B-RVPPCR: NOT DETECTED
Influenza A: NOT DETECTED
Metapneumovirus: NOT DETECTED
Mycoplasma pneumoniae: NOT DETECTED
PARAINFLUENZA VIRUS 2-RVPPCR: NOT DETECTED
PARAINFLUENZA VIRUS 3-RVPPCR: NOT DETECTED
PARAINFLUENZA VIRUS 4-RVPPCR: NOT DETECTED
Parainfluenza Virus 1: NOT DETECTED
RESPIRATORY SYNCYTIAL VIRUS-RVPPCR: NOT DETECTED
RHINOVIRUS / ENTEROVIRUS - RVPPCR: DETECTED — AB

## 2016-10-27 LAB — GLUCOSE, CAPILLARY
GLUCOSE-CAPILLARY: 173 mg/dL — AB (ref 65–99)
Glucose-Capillary: 126 mg/dL — ABNORMAL HIGH (ref 65–99)
Glucose-Capillary: 148 mg/dL — ABNORMAL HIGH (ref 65–99)
Glucose-Capillary: 189 mg/dL — ABNORMAL HIGH (ref 65–99)

## 2016-10-27 MED ORDER — FUROSEMIDE 10 MG/ML IJ SOLN
60.0000 mg | Freq: Two times a day (BID) | INTRAMUSCULAR | Status: DC
  Administered 2016-10-27 – 2016-11-07 (×22): 60 mg via INTRAVENOUS
  Filled 2016-10-27 (×22): qty 6

## 2016-10-27 MED ORDER — FUROSEMIDE 10 MG/ML IJ SOLN
40.0000 mg | Freq: Once | INTRAMUSCULAR | Status: AC
  Administered 2016-10-27: 40 mg via INTRAVENOUS
  Filled 2016-10-27: qty 4

## 2016-10-27 MED ORDER — POTASSIUM CHLORIDE CRYS ER 20 MEQ PO TBCR
20.0000 meq | EXTENDED_RELEASE_TABLET | Freq: Two times a day (BID) | ORAL | Status: AC
  Administered 2016-10-27 – 2016-10-28 (×3): 20 meq via ORAL
  Filled 2016-10-27 (×3): qty 1

## 2016-10-27 MED ORDER — ALBUTEROL SULFATE (2.5 MG/3ML) 0.083% IN NEBU
2.5000 mg | INHALATION_SOLUTION | RESPIRATORY_TRACT | Status: DC
  Administered 2016-10-28 (×5): 2.5 mg via RESPIRATORY_TRACT
  Filled 2016-10-27 (×6): qty 3

## 2016-10-27 MED ORDER — CARVEDILOL 3.125 MG PO TABS
3.1250 mg | ORAL_TABLET | Freq: Two times a day (BID) | ORAL | Status: DC
  Administered 2016-10-27 – 2016-10-29 (×4): 3.125 mg via ORAL
  Filled 2016-10-27 (×4): qty 1

## 2016-10-27 MED ORDER — POTASSIUM CHLORIDE CRYS ER 20 MEQ PO TBCR
40.0000 meq | EXTENDED_RELEASE_TABLET | Freq: Once | ORAL | Status: AC
  Administered 2016-10-27: 40 meq via ORAL
  Filled 2016-10-27: qty 2

## 2016-10-27 NOTE — Progress Notes (Signed)
[facility] TEAM 1 - Stepdown/ICU TEAM  Ashaki Rosasco  ZOX:[id number] DOB: July 30, 1986 DOA: 10/26/2016 PCP: No PCP Per Patient    Brief Narrative:  31 y.o. F w/ hx of hypertension, diabetes mellitus, and missed abortion in 2017 who presented with complaints of shortness of breath and worsening lower extremity edema over 4 weeks.  Her sx did not improve after being tx for PNA as an outpt.  Labs in the ED noted a troponin of 0.06, BNP of 867, chest x-ray significant for worsening multifocal infiltrates and small pleural effusion.  Subjective: The patient is sitting up in a bedside chair.  She reports her shortness of breath has not significantly improved since yesterday.  She denies chest pain nausea vomiting or abdominal pain.  She reports the swelling in her legs has not changed.  Assessment & Plan:  Acute hypoxic respiratory failure due to Pulmonary Edema  Continues to require 4 L nasal cannula oxygen but sats are improved - wean oxygen as able - pt is afebrile w/ normal WBC > I do not think this is infectious - stop abx - tx pulmonary edema and f/u CXR when signif diuresis accomplished  CHF TTE notes severe global reduction in LV systolic fxn w/ EF 30% - change to IV lasix and increase dose significantly   HTN Blood pressure is now well controlled - follow trend  DM CBG reasonably controlled - follow without change in treatment plan today  Acute kidney injury crt slowly improving - check renal US - suspect this is due to HTN and DM   Recent Labs Lab 10/26/16 0307 10/26/16 2055 10/27/16 0258  CREATININE 1.80* 1.71* 1.66*   Morbid Obesity - Body mass index is 46.85 kg/m.   DVT prophylaxis: SCDs Code Status: FULL CODE Family Communication: no family present at time of exam  Disposition Plan:   Consultants:  [facility]   Procedures: 2/1 TTE  Antimicrobials:  Unasyn 2/1 > 2/2  Objective: Blood pressure 122/89, pulse (!) 107, temperature 98.2 F (36.8 C), temperature  source Oral, resp. rate (!) 21, height 4\' 9"  (1.448 m), weight 98.2 kg (216 lb 7.9 oz), last menstrual period 10/23/2016, SpO2 100 %, not currently breastfeeding.  Intake/Output Summary (Last 24 hours) at 10/27/16 1337 Last data filed at 10/26/16 2032  Gross per 24 hour  Intake              120 ml  Output                0 ml  Net              120 ml   Filed Weights   10/26/16 0057 10/26/16 2032 10/27/16 0500  Weight: 96.2 kg (212 lb) 98 kg (216 lb 0.8 oz) 98.2 kg (216 lb 7.9 oz)    Examination: General: No acute respiratory distress at rest  Lungs: Diffuse fine crackles with poor air movement appreciable in all fields Cardiovascular: Regular rate and rhythm without murmur gallop or rub normal S1 and S2 Abdomen: Nontender, obese, soft, bowel sounds positive, no rebound, no ascites, no appreciable mass Extremities: No significant cyanosis, or clubbing, 2+ pitting edema bilateral lower extremities  CBC:  Recent Labs Lab 10/26/16 0249 10/26/16 0307 10/26/16 2055  WBC 6.9  --  5.8  NEUTROABS 5.9  --   --   HGB 10.5* 11.9* 10.1*  HCT 34.0* 35.0* 33.1*  MCV 61.7*  --  62.7*  PLT 493*  --  499*   Basic Metabolic  Panel:  Recent Labs Lab 10/26/16 0307 10/26/16 2055 10/27/16 0258  NA 140  --  139  K 3.8  --  3.6  CL 108  --  107  CO2  --   --  22  GLUCOSE 223*  --  172*  BUN 20  --  19  CREATININE 1.80* 1.71* 1.66*  CALCIUM  --   --  8.0*   GFR: Estimated Creatinine Clearance: 47.9 mL/min (by C-G formula based on SCr of 1.66 mg/dL (H)).  Liver Function Tests: No results for input(s): AST, ALT, ALKPHOS, BILITOT, PROT, ALBUMIN in the last 168 hours. No results for input(s): LIPASE, AMYLASE in the last 168 hours. No results for input(s): AMMONIA in the last 168 hours.  Coagulation Profile: No results for input(s): INR, PROTIME in the last 168 hours.  Cardiac Enzymes:  Recent Labs Lab 10/26/16 0819 10/26/16 1840  TROPONINI 0.06* 0.06*    HbA1C: No results  found for: HGBA1C  CBG:  Recent Labs Lab 10/26/16 1129 10/26/16 1659 10/26/16 2108 10/27/16 0821 10/27/16 1309  GLUCAP 234* 192* 160* 173* 126*    Recent Results (from the past 240 hour(s))  MRSA PCR Screening     Status: None   Collection Time: 10/26/16  8:32 PM  Result Value Ref Range Status   MRSA by PCR NEGATIVE NEGATIVE Final    Comment:        The GeneXpert MRSA Assay (FDA approved for NASAL specimens only), is one component of a comprehensive MRSA colonization surveillance program. It is not intended to diagnose MRSA infection nor to guide or monitor treatment for MRSA infections.      Scheduled Meds: . amoxicillin-clavulanate  1 tablet Oral BID  . carvedilol  3.125 mg Oral BID WC  . furosemide  40 mg Intravenous Once  . heparin  5,000 Units Subcutaneous Q8H  . insulin aspart  0-9 Units Subcutaneous TID WC  . methyldopa  250 mg Oral BID  . potassium chloride  40 mEq Oral Once  . sodium chloride flush  3 mL Intravenous Q12H     LOS: 1 day   Lonia Blood, MD [facility] Office  [phone] Pager - Text Page per Loretha Stapler as per below:  On-Call/Text Page:      [email or web address]      password [email or web address]  If 7PM-7AM, please contact night-coverage [email or web address] Password [email or web address] 10/27/2016, 1:37 PM

## 2016-10-27 NOTE — Progress Notes (Signed)
Progress Note  Patient Name: Lisa Hodges Date of Encounter: 10/27/2016  Primary Cardiologist: Dr Antoine Poche- new  Subjective   No change, still SOB, on O2  Inpatient Medications    Scheduled Meds: . amoxicillin-clavulanate  1 tablet Oral BID  . heparin  5,000 Units Subcutaneous Q8H  . insulin aspart  0-9 Units Subcutaneous TID WC  . methyldopa  250 mg Oral BID  . sodium chloride flush  3 mL Intravenous Q12H   Continuous Infusions:  PRN Meds: sodium chloride, acetaminophen, hydrALAZINE, ondansetron (ZOFRAN) IV, sodium chloride flush   Vital Signs    Vitals:   10/27/16 0000 10/27/16 0400 10/27/16 0500 10/27/16 0822  BP: (!) 131/91 107/73  128/82  Pulse: (!) 115 (!) 107  (!) 104  Resp: (!) 33 (!) 26  (!) 32  Temp: 98.1 F (36.7 C) 97.7 F (36.5 C)  98.3 F (36.8 C)  TempSrc: Axillary Oral  Oral  SpO2: 93% 100%  100%  Weight:   216 lb 7.9 oz (98.2 kg)   Height:        Intake/Output Summary (Last 24 hours) at 10/27/16 1147 Last data filed at 10/26/16 2032  Gross per 24 hour  Intake              120 ml  Output                0 ml  Net              120 ml   Filed Weights   10/26/16 0057 10/26/16 2032 10/27/16 0500  Weight: 212 lb (96.2 kg) 216 lb 0.8 oz (98 kg) 216 lb 7.9 oz (98.2 kg)    Telemetry    NSR, ST- Personally Reviewed  ECG    NSR, ST - Personally Reviewed  Physical Exam   GEN: Obese, on O2, No acute distress.   Neck: No JVD Cardiac: RRR, no murmurs, rubs, or gallops.  Respiratory: no rales, decreased breath sounds bilateral bases MS: No edema; No deformity. Neuro:  Nonfocal  Psych: Normal affect   Labs    Chemistry Recent Labs Lab 10/26/16 0307 10/26/16 2055 10/27/16 0258  NA 140  --  139  K 3.8  --  3.6  CL 108  --  107  CO2  --   --  22  GLUCOSE 223*  --  172*  BUN 20  --  19  CREATININE 1.80* 1.71* 1.66*  CALCIUM  --   --  8.0*  GFRNONAA  --  39* 40*  GFRAA  --  45* 46*  ANIONGAP  --   --  10     Hematology Recent  Labs Lab 10/26/16 0249 10/26/16 0307 10/26/16 2055  WBC 6.9  --  5.8  RBC 5.51*  --  5.28*  HGB 10.5* 11.9* 10.1*  HCT 34.0* 35.0* 33.1*  MCV 61.7*  --  62.7*  MCH 19.1*  --  19.1*  MCHC 30.9  --  30.5  RDW 16.2*  --  16.0*  PLT 493*  --  499*    Cardiac Enzymes Recent Labs Lab 10/26/16 0819 10/26/16 1840  TROPONINI 0.06* 0.06*    Recent Labs Lab 10/26/16 0306  TROPIPOC 0.09*     BNP Recent Labs Lab 10/26/16 0249  BNP 867.1*     DDimer No results for input(s): DDIMER in the last 168 hours.   Radiology    Dg Chest 2 View  Result Date: 10/26/2016 CLINICAL DATA:  Follow-up pneumonia. Shortness  of breath for 2 days, worsening today. Hypertensive. History diabetes. EXAM: CHEST  2 VIEW COMPARISON:  Chest radiograph October 12, 2016 FINDINGS: Worsening multifocal consolidation. Small LEFT pleural effusion. Mild cardiomegaly even with consideration to this low inspiratory portable examination with crowded vascular markings. No pneumothorax. Soft tissue planes and included osseous structures are nonsuspicious. IMPRESSION: Worsening multifocal pneumonia.  Small pleural effusion. Stable cardiomegaly. Electronically Signed   By: Awilda Metro M.D.   On: 10/26/2016 03:20    Cardiac Studies   Echo 10/26/16- Study Conclusions  - Left ventricle: The cavity size was normal. Wall thickness was   increased in a pattern of mild LVH. Systolic function was   moderately to severely reduced. The estimated ejection fraction   was in the range of 30% to 35%. Diffuse hypokinesis. - Aortic valve: There was mild regurgitation. - Mitral valve: There was mild regurgitation. - Pericardium, extracardiac: A small pericardial effusion was   identified.  Impressions:  - Moderate to severe global reduction in LV systolic function; mild   AI and MR; small to moderate pericardial effusion with RA   collapse but no RV diastolic collapse and IVC not dilated.   Patient Profile     31  y.o. female with a history of HTN, DM, and recent CAP-came back tot he ED 10/26/16 with dyspnea. BNP 867, Troponin 0.06. Her CXR suggested worsening pneumonia but CHF was suspected as well. Echo showed cardiomyopathy with an EF of 30% (presumably NICM with global HK).   Assessment & Plan    1. Community Acquired PNA  Patient diagnosed with CAP 10/12/16 and d/c with azithromycin. Completed course of abx with no relief of symptoms. Returned to ER 10/26/16 for ongoing dyspnea.  2. Acute Heart Failure with unknown etiology  CXR showed worsening multifocal PNA, small pleural effusions, and stable cardiomegaly.  BNP 867. Echos showed EF 30% with global HK.  3. Renal insufficiency with undetermined chronicity.  On 09/01/14 her Cr was 0.81. SCr was 1.8 on adm. Suspect chronic kidney disease.   4. Prolonged QTc Suspect this is in the setting of Azithromycin use.   5. HTN:  No PCP, states she is managed by refill from ED. Need for establish care with PCP for chronic management and follow-up. Came to the ED with elevated readings as high as 189/111. Last reading 144/88. See above for medication management + PRN Hydralazine. Aldomet added by Primary Service.   6. DM:  No PCP, states she is managed by refill from ED. Need for establish care with PCP for chronic management and follow-up. On Metformin 500mg  BID at home. Obtain Lipid Panel. Defer inpatient treatment to primary.   Plan: She received two doses of Lasix IV 20 mg yesterday with no significant diuresis ordered. Will order 40 mg IV Lasix today. Check BNP in am. Unable to add ARB at this time secondary to renal insufficiency but hopefully will be able to add later. Consider low dose Coreg.    Signed, [name], PA-C  10/27/2016, 11:47 AM    History and all data above reviewed.  Patient examined.  I agree with the findings as above. She is not convinced that she is breathing any better.  No pain.   The patient exam reveals [id number] but  improved  ,  Lungs: Decreased breath sounds  ,  Abd: Positive bowel sounds, no rebound no guarding, Ext mod edema  .  All available labs, radiology testing, previous records reviewed. Agree with documented assessment and plan. Increase lasix  as above.  I will add Coreg.   [name]  12:12 PM  10/27/2016

## 2016-10-28 ENCOUNTER — Inpatient Hospital Stay (HOSPITAL_COMMUNITY): Payer: Medicaid Other

## 2016-10-28 LAB — URINALYSIS, ROUTINE W REFLEX MICROSCOPIC
Bilirubin Urine: NEGATIVE
Glucose, UA: 50 mg/dL — AB
Ketones, ur: NEGATIVE mg/dL
Leukocytes, UA: NEGATIVE
Nitrite: NEGATIVE
PH: 6 (ref 5.0–8.0)
Protein, ur: 100 mg/dL — AB
SPECIFIC GRAVITY, URINE: 1.006 (ref 1.005–1.030)

## 2016-10-28 LAB — GLUCOSE, CAPILLARY
GLUCOSE-CAPILLARY: 148 mg/dL — AB (ref 65–99)
GLUCOSE-CAPILLARY: 140 mg/dL — AB (ref 65–99)
GLUCOSE-CAPILLARY: 135 mg/dL — AB (ref 65–99)
GLUCOSE-CAPILLARY: 144 mg/dL — AB (ref 65–99)
GLUCOSE-CAPILLARY: 155 mg/dL — AB (ref 65–99)

## 2016-10-28 LAB — COMPREHENSIVE METABOLIC PANEL
ALT: 29 U/L (ref 14–54)
ANION GAP: 8 (ref 5–15)
AST: 35 U/L (ref 15–41)
Albumin: 1.3 g/dL — ABNORMAL LOW (ref 3.5–5.0)
Alkaline Phosphatase: 66 U/L (ref 38–126)
BUN: 20 mg/dL (ref 6–20)
CALCIUM: 7.9 mg/dL — AB (ref 8.9–10.3)
CO2: 25 mmol/L (ref 22–32)
Chloride: 107 mmol/L (ref 101–111)
Creatinine, Ser: 1.65 mg/dL — ABNORMAL HIGH (ref 0.44–1.00)
GFR, EST AFRICAN AMERICAN: 47 mL/min — AB (ref >60)
GFR, EST NON AFRICAN AMERICAN: 40 mL/min — AB (ref >60)
Glucose, Bld: 151 mg/dL — ABNORMAL HIGH (ref 65–99)
Potassium: 4.3 mmol/L (ref 3.5–5.1)
SODIUM: 140 mmol/L (ref 135–145)
TOTAL PROTEIN: 5 g/dL — AB (ref 6.5–8.1)
Total Bilirubin: 0.6 mg/dL (ref 0.3–1.2)

## 2016-10-28 LAB — CBC
HCT: 32 % — ABNORMAL LOW (ref 36.0–46.0)
HEMOGLOBIN: 9.9 g/dL — AB (ref 12.0–15.0)
MCH: 19.3 pg — AB (ref 26.0–34.0)
MCHC: 30.9 g/dL (ref 30.0–36.0)
MCV: 62.3 fL — ABNORMAL LOW (ref 78.0–100.0)
Platelets: 462 10*3/uL — ABNORMAL HIGH (ref 150–400)
RBC: 5.14 MIL/uL — AB (ref 3.87–5.11)
RDW: 16 % — ABNORMAL HIGH (ref 11.5–15.5)
WBC: 4.9 10*3/uL (ref 4.0–10.5)

## 2016-10-28 LAB — TROPONIN I: TROPONIN I: 0.04 ng/mL — AB (ref <0.03)

## 2016-10-28 LAB — BRAIN NATRIURETIC PEPTIDE: B NATRIURETIC PEPTIDE 5: 523.8 pg/mL — AB (ref 0.0–100.0)

## 2016-10-28 MED ORDER — ALBUTEROL SULFATE (2.5 MG/3ML) 0.083% IN NEBU
2.5000 mg | INHALATION_SOLUTION | RESPIRATORY_TRACT | Status: DC | PRN
  Administered 2016-10-30 – 2016-10-31 (×2): 2.5 mg via RESPIRATORY_TRACT
  Filled 2016-10-28 (×3): qty 3

## 2016-10-28 MED ORDER — ASPIRIN 81 MG PO CHEW
81.0000 mg | CHEWABLE_TABLET | Freq: Once | ORAL | Status: AC
  Administered 2016-10-28: 81 mg via ORAL
  Filled 2016-10-28: qty 1

## 2016-10-28 NOTE — Progress Notes (Signed)
[place] TEAM 1 - Stepdown/ICU TEAM  Lisa Hodges  RUE:[id number] DOB: [birth year] DOA: 10/26/2016 PCP: No PCP Per Patient    Brief Narrative:  31 y.o. F w/ hx of hypertension, diabetes mellitus, and missed abortion in 2017 who presented with complaints of shortness of breath and worsening lower extremity edema over 4 weeks.  Her sx did not improve after being tx for PNA as an outpt.  Labs in the ED noted a troponin of 0.06, BNP of 867, chest x-ray significant for worsening multifocal infiltrates and small pleural effusion.  Subjective: The patient feels that her symptoms are slowly beginning to improve though she does still note significant swelling in her feet.  She denies chest pain nausea vomiting or abdominal pain.  Assessment & Plan:  Newly diagnosed acute systolic CHF TTE notes severe global reduction in LV systolic fxn w/ EF 30% - cont in attempts to diurese - etiology not clear at present - Cards following w/ plan for eventual outpt ischemia evaluation - net negative ~1.8L thus far    [facility] Weights   10/26/16 2032 10/27/16 0500 10/28/16 0350  Weight: 98 kg (216 lb 0.8 oz) 98.2 kg (216 lb 7.9 oz) 96.2 kg (212 lb 1.3 oz)    Acute hypoxic respiratory failure due to Pulmonary Edema  Has been successfully weaned to 2 L nasal cannula - continue to wean oxygen as able - pt is afebrile w/ normal WBC > I do not think this is infectious - stopped abx - tx pulmonary edema and f/u CXR when signif diuresis accomplished  Rhinovirus URI Confirmed on viral resp panel   HTN Blood pressure is now well controlled - follow trend  DM CBG reasonably controlled - follow without change in treatment plan today  Acute kidney injury crt slowly improving - check renal US - suspect this is due to HTN and DM - check UA to eval for proteinuria   Recent Labs Lab 10/26/16 0307 10/26/16 2055 10/27/16 0258 10/28/16 0242  CREATININE 1.80* 1.71* 1.66* 1.65*   Morbid Obesity - Body mass index is 45.89  kg/m.   DVT prophylaxis: SCDs Code Status: FULL CODE Family Communication: no family present at time of exam  Disposition Plan: Stable for transfer to CHF floor - continue diuresis - evaluate for renal function - educate patient - mobilize  Consultants:  [facility]   Procedures: 2/1 TTE  Antimicrobials:  Unasyn 2/1 > 2/2  Objective: Blood pressure 120/77, pulse 99, temperature 97.9 F (36.6 C), temperature source Oral, resp. rate (!) 23, height 4\' 9"  (1.448 m), weight 96.2 kg (212 lb 1.3 oz), last menstrual period 10/23/2016, SpO2 94 %, not currently breastfeeding.  Intake/Output Summary (Last 24 hours) at 10/28/16 1527 Last data filed at 10/28/16 0700  Gross per 24 hour  Intake                0 ml  Output             1475 ml  Net            -1475 ml   Filed Weights   10/26/16 2032 10/27/16 0500 10/28/16 0350  Weight: 98 kg (216 lb 0.8 oz) 98.2 kg (216 lb 7.9 oz) 96.2 kg (212 lb 1.3 oz)    Examination: General: No acute respiratory distress sitting up in chair Lungs: Diffuse fine crackles with poor air movement appreciable in all fields - no signif change in exam  Cardiovascular: Regular rate and rhythm without murmur  Abdomen:  Nontender, obese, soft, bowel sounds positive, no rebound, no ascites, no appreciable mass Extremities: No significant cyanosis, or clubbing, 2+ pitting edema bilateral lower extremities persists   CBC:  Recent Labs Lab 10/26/16 0249 10/26/16 0307 10/26/16 2055 10/28/16 0242  WBC 6.9  --  5.8 4.9  NEUTROABS 5.9  --   --   --   HGB 10.5* 11.9* 10.1* 9.9*  HCT 34.0* 35.0* 33.1* 32.0*  MCV 61.7*  --  62.7* 62.3*  PLT 493*  --  499* 462*   Basic Metabolic Panel:  Recent Labs Lab 10/26/16 0307 10/26/16 2055 10/27/16 0258 10/28/16 0242  NA 140  --  139 140  K 3.8  --  3.6 4.3  CL 108  --  107 107  CO2  --   --  22 25  GLUCOSE 223*  --  172* 151*  BUN 20  --  19 20  CREATININE 1.80* 1.71* 1.66* 1.65*  CALCIUM  --   --  8.0*  7.9*   GFR: Estimated Creatinine Clearance: 47.6 mL/min (by C-G formula based on SCr of 1.65 mg/dL (H)).  Liver Function Tests:  Recent Labs Lab 10/28/16 0242  AST 35  ALT 29  ALKPHOS 66  BILITOT 0.6  PROT 5.0*  ALBUMIN 1.3*   Cardiac Enzymes:  Recent Labs Lab 10/26/16 0819 10/26/16 1840 10/28/16 0242  TROPONINI 0.06* 0.06* 0.04*    CBG:  Recent Labs Lab 10/27/16 1721 10/27/16 2146 10/28/16 0837 10/28/16 1050 10/28/16 1227  GLUCAP 148* 189* 148* 140* 135*    Recent Results (from the past 240 hour(s))  Respiratory Panel by PCR     Status: Abnormal   Collection Time: 10/26/16  4:40 PM  Result Value Ref Range Status   Adenovirus NOT DETECTED NOT DETECTED Final   Coronavirus 229E NOT DETECTED NOT DETECTED Final   Coronavirus HKU1 NOT DETECTED NOT DETECTED Final   Coronavirus NL63 NOT DETECTED NOT DETECTED Final   Coronavirus OC43 NOT DETECTED NOT DETECTED Final   Metapneumovirus NOT DETECTED NOT DETECTED Final   Rhinovirus / Enterovirus DETECTED (A) NOT DETECTED Final   Influenza A NOT DETECTED NOT DETECTED Final   Influenza B NOT DETECTED NOT DETECTED Final   Parainfluenza Virus 1 NOT DETECTED NOT DETECTED Final   Parainfluenza Virus 2 NOT DETECTED NOT DETECTED Final   Parainfluenza Virus 3 NOT DETECTED NOT DETECTED Final   Parainfluenza Virus 4 NOT DETECTED NOT DETECTED Final   Respiratory Syncytial Virus NOT DETECTED NOT DETECTED Final   Bordetella pertussis NOT DETECTED NOT DETECTED Final   Chlamydophila pneumoniae NOT DETECTED NOT DETECTED Final   Mycoplasma pneumoniae NOT DETECTED NOT DETECTED Final    Comment: Performed at [facility], [address], [place], [place] [address]  MRSA PCR Screening     Status: None   Collection Time: 10/26/16  8:32 PM  Result Value Ref Range Status   MRSA by PCR NEGATIVE NEGATIVE Final    Comment:        The GeneXpert MRSA Assay (FDA approved for NASAL specimens only), is one component of a comprehensive  MRSA colonization surveillance program. It is not intended to diagnose MRSA infection nor to guide or monitor treatment for MRSA infections.      Scheduled Meds: . albuterol  2.5 mg Nebulization Q4H  . carvedilol  3.125 mg Oral BID WC  . furosemide  60 mg Intravenous Q12H  . heparin  5,000 Units Subcutaneous Q8H  . insulin aspart  0-9 Units Subcutaneous  TID WC     LOS: 2 days   Lonia Blood, MD [facility] Office  [phone] Pager - Text Page per Amion as per below:  On-Call/Text Page:      [email or web address]      password [email or web address]  If 7PM-7AM, please contact night-coverage [email or web address] Password [email or web address] 10/28/2016, 3:27 PM

## 2016-10-28 NOTE — Progress Notes (Signed)
Progress Note  Patient Name: Lisa Hodges Date of Encounter: 10/28/2016  Primary Cardiologist: New Ariea Rochin  Subjective   She is breathing somewhat better.  No pain.  Still with cough.    Inpatient Medications    Scheduled Meds: . albuterol  2.5 mg Nebulization Q4H  . carvedilol  3.125 mg Oral BID WC  . furosemide  60 mg Intravenous Q12H  . heparin  5,000 Units Subcutaneous Q8H  . insulin aspart  0-9 Units Subcutaneous TID WC   Continuous Infusions:  PRN Meds: acetaminophen, hydrALAZINE, ondansetron (ZOFRAN) IV   Vital Signs    Vitals:   10/28/16 0332 10/28/16 0350 10/28/16 0824 10/28/16 1053  BP: 123/79   114/85  Pulse: (!) 102   99  Resp: (!) 23   17  Temp: 98.2 F (36.8 C)   97.9 F (36.6 C)  TempSrc: Oral   Oral  SpO2: 95%  95% 97%  Weight:  212 lb 1.3 oz (96.2 kg)    Height:        Intake/Output Summary (Last 24 hours) at 10/28/16 1111 Last data filed at 10/28/16 0700  Gross per 24 hour  Intake                0 ml  Output             1875 ml  Net            -1875 ml   Filed Weights   10/26/16 2032 10/27/16 0500 10/28/16 0350  Weight: 216 lb 0.8 oz (98 kg) 216 lb 7.9 oz (98.2 kg) 212 lb 1.3 oz (96.2 kg)    Telemetry    NSR - Personally Reviewed  ECG    NA - Personally Reviewed  Physical Exam   GEN: No acute distress.   Neck: No JVD Cardiac: RRR, no murmurs, rubs, or gallops.  Respiratory: Clear to auscultation bilaterally. GI: Soft, nontender, non-distended  MS: Mild edema; No deformity. Neuro:  Nonfocal  Psych: Normal affect   Labs    Chemistry Recent Labs Lab 10/26/16 0307 10/26/16 2055 10/27/16 0258 10/28/16 0242  NA 140  --  139 140  K 3.8  --  3.6 4.3  CL 108  --  107 107  CO2  --   --  22 25  GLUCOSE 223*  --  172* 151*  BUN 20  --  19 20  CREATININE 1.80* 1.71* 1.66* 1.65*  CALCIUM  --   --  8.0* 7.9*  PROT  --   --   --  5.0*  ALBUMIN  --   --   --  1.3*  AST  --   --   --  35  ALT  --   --   --  29  ALKPHOS   --   --   --  66  BILITOT  --   --   --  0.6  GFRNONAA  --  39* 40* 40*  GFRAA  --  45* 46* 47*  ANIONGAP  --   --  10 8     Hematology Recent Labs Lab 10/26/16 0249 10/26/16 0307 10/26/16 2055 10/28/16 0242  WBC 6.9  --  5.8 4.9  RBC 5.51*  --  5.28* 5.14*  HGB 10.5* 11.9* 10.1* 9.9*  HCT 34.0* 35.0* 33.1* 32.0*  MCV 61.7*  --  62.7* 62.3*  MCH 19.1*  --  19.1* 19.3*  MCHC 30.9  --  30.5 30.9  RDW 16.2*  --  16.0* 16.0*  PLT 493*  --  499* 462*    Cardiac Enzymes Recent Labs Lab 10/26/16 0819 10/26/16 1840 10/28/16 0242  TROPONINI 0.06* 0.06* 0.04*    Recent Labs Lab 10/26/16 0306  TROPIPOC 0.09*     BNP Recent Labs Lab 10/26/16 0249 10/28/16 0242  BNP 867.1* 523.8*     DDimer No results for input(s): DDIMER in the last 168 hours.   Radiology    No results found.  Cardiac Studies   Echo 10/26/16- Study Conclusions  - Left ventricle: The cavity size was normal. Wall thickness was increased in a pattern of mild LVH. Systolic function was moderately to severely reduced. The estimated ejection fraction was in the range of 30% to 35%. Diffuse hypokinesis. - Aortic valve: There was mild regurgitation. - Mitral valve: There was mild regurgitation. - Pericardium, extracardiac: A small pericardial effusion was identified.  Impressions:  - Moderate to severe global reduction in LV systolic function; mild AI and MR; small to moderate pericardial effusion with RA collapse but no RV diastolic collapse and IVC not dilated.   Patient Profile     31 y.o. female with a history of HTN, DM, and recent CAP-came back tot he ED 10/26/16 with dyspnea. BNP 867, Troponin 0.06. Her CXR suggested worsening pneumonia but CHF was suspected as well. Echo showed cardiomyopathy with an EF of 30% (presumably NICM with global HK).   Assessment & Plan    ACUTE SYSTOLIC HEART FAILURE:    Beta blocker added yesterday and increased diuretic.   I am going to hold  off onstarting ARB/ACE.  Need to understand that her creat will remain stable with diuresis.  She was down 1.8 liters yesterday.  Continue current IV diuresis.  I do not plan an ischemia work up this admission but will consider cath vs [facility] as an out patient.    CKD:   Creat is stable.    HTN:   BP is controlled.      Signed, Rollene Rotunda, MD  10/28/2016, 11:11 AM

## 2016-10-28 NOTE — Progress Notes (Signed)
CRITICAL VALUE ALERT  Critical value received:  Troponin 0.04  Date of notification:  10/28/2016  Time of notification:  03:45  Critical value read back:Yes  Nurse who received alert:  Loma Sender  MD notified (1st page):  Toniann Fail  Time of first page:  04:00  MD notified (2nd page):  Time of second page:  Responding MD:  Toniann Fail  Time MD responded:  04:01

## 2016-10-29 DIAGNOSIS — R0600 Dyspnea, unspecified: Secondary | ICD-10-CM

## 2016-10-29 DIAGNOSIS — E1165 Type 2 diabetes mellitus with hyperglycemia: Secondary | ICD-10-CM

## 2016-10-29 DIAGNOSIS — R0689 Other abnormalities of breathing: Secondary | ICD-10-CM

## 2016-10-29 LAB — BASIC METABOLIC PANEL
ANION GAP: 9 (ref 5–15)
BUN: 23 mg/dL — AB (ref 6–20)
CALCIUM: 7.8 mg/dL — AB (ref 8.9–10.3)
CO2: 25 mmol/L (ref 22–32)
Chloride: 104 mmol/L (ref 101–111)
Creatinine, Ser: 1.74 mg/dL — ABNORMAL HIGH (ref 0.44–1.00)
GFR calc Af Amer: 44 mL/min — ABNORMAL LOW (ref >60)
GFR, EST NON AFRICAN AMERICAN: 38 mL/min — AB (ref >60)
GLUCOSE: 176 mg/dL — AB (ref 65–99)
Potassium: 3.9 mmol/L (ref 3.5–5.1)
Sodium: 138 mmol/L (ref 135–145)

## 2016-10-29 LAB — GLUCOSE, CAPILLARY
GLUCOSE-CAPILLARY: 146 mg/dL — AB (ref 65–99)
Glucose-Capillary: 183 mg/dL — ABNORMAL HIGH (ref 65–99)
Glucose-Capillary: 143 mg/dL — ABNORMAL HIGH (ref 65–99)
Glucose-Capillary: 160 mg/dL — ABNORMAL HIGH (ref 65–99)

## 2016-10-29 MED ORDER — ALUM & MAG HYDROXIDE-SIMETH 200-200-20 MG/5ML PO SUSP
15.0000 mL | Freq: Four times a day (QID) | ORAL | Status: DC | PRN
  Administered 2016-10-29: 15 mL via ORAL
  Filled 2016-10-29: qty 30

## 2016-10-29 MED ORDER — CARVEDILOL 6.25 MG PO TABS
6.2500 mg | ORAL_TABLET | Freq: Two times a day (BID) | ORAL | Status: DC
  Administered 2016-10-29 – 2016-11-06 (×16): 6.25 mg via ORAL
  Filled 2016-10-29 (×16): qty 1

## 2016-10-29 MED ORDER — HYDRALAZINE HCL 10 MG PO TABS
10.0000 mg | ORAL_TABLET | Freq: Three times a day (TID) | ORAL | Status: DC
  Administered 2016-10-29 – 2016-11-07 (×27): 10 mg via ORAL
  Filled 2016-10-29 (×28): qty 1

## 2016-10-29 MED ORDER — GUAIFENESIN-DM 100-10 MG/5ML PO SYRP
5.0000 mL | ORAL_SOLUTION | ORAL | Status: DC | PRN
  Administered 2016-10-29 – 2016-11-02 (×6): 5 mL via ORAL
  Filled 2016-10-29 (×6): qty 5

## 2016-10-29 MED ORDER — ISOSORBIDE MONONITRATE ER 30 MG PO TB24
15.0000 mg | ORAL_TABLET | Freq: Every day | ORAL | Status: DC
  Administered 2016-10-29 – 2016-11-06 (×9): 15 mg via ORAL
  Filled 2016-10-29 (×10): qty 1

## 2016-10-29 NOTE — Evaluation (Signed)
Physical Therapy Evaluation Patient Details Name: Lisa Hodges MRN: [id number] DOB: 21-Jun-1986 Today's Date: 10/29/2016   History of Present Illness  31 y.o. female admitted to [facility] on 10/26/16 for SOB.  Newly dx with acute systolic CHF, acute hypoxic respiratory failure due to pulmonary edema, and rhinovirus URI, and acute kidney injury.  Pt with significant PMHx of DM, and HTN.  Clinical Impression  Pt is deconditioned with low activity tolerance.  She is reporting chest tightness with gait.  RN made aware.  HR low 100s throughout and O2 sats 94% during gait on RA with DOE 2/4.  Pt could not keep O2 sats >85% on RA, so we had to walk with O2 at this time (see separate O2 sat note).  Pt would benefit from acute PT, but likely will not need f/u PT at discharge.   PT to follow acutely for deficits listed below.       Follow Up Recommendations No PT follow up    Equipment Recommendations  Other (comment) (possibly home O2?)    Recommendations for Other Services   NA    Precautions / Restrictions Precautions Precautions: Other (comment) Precaution Comments: monitor O2 sats      Mobility  Bed Mobility Overal bed mobility: Modified Independent                Transfers Overall transfer level: Modified independent                  Ambulation/Gait Ambulation/Gait assistance: Supervision Ambulation Distance (Feet): 75 Feet Assistive device:  (hallway railing at times for support and rest breaks) Gait Pattern/deviations: Step-through pattern Gait velocity: decreased Gait velocity interpretation: Below normal speed for age/gender General Gait Details: Pt with slow gait pattern, using hallway railing at times for support, reporting chest tightness.  HR stable and O2 sats on 2 L O2 [place] 94%.  DOE 2/4.  RN made aware of chest tightness, monitor not reporting any arrythmias.          Balance Overall balance assessment: Needs assistance Sitting-balance support: Feet  supported;No upper extremity supported Sitting balance-Leahy Scale: Normal     Standing balance support: No upper extremity supported Standing balance-Leahy Scale: Good                               Pertinent Vitals/Pain Pain Assessment: Faces Faces Pain Scale: Hurts little more Pain Location: chest tightness (RN made aware) Pain Descriptors / Indicators: Aching (with gait) Pain Intervention(s): Limited activity within patient's tolerance;Monitored during session;Repositioned;Other (comment) (RN made aware)    Home Living Family/patient expects to be discharged to:: Private residence Living Arrangements: Spouse/significant other (boyfriend)   Type of Home: Apartment Home Access: Level entry     Home Layout: One level        Prior Function Level of Independence: Independent         Comments: 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          Extremity/Trunk Assessment   Upper Extremity Assessment Upper Extremity Assessment: Overall WFL for tasks assessed    Lower Extremity Assessment Lower Extremity Assessment: Generalized weakness    Cervical / Trunk Assessment Cervical / Trunk Assessment: Normal  Communication   Communication: No difficulties  Cognition Arousal/Alertness: Awake/alert Behavior During Therapy: Anxious Overall Cognitive Status: Within Functional Limits for tasks assessed  Exercises General Exercises - Lower Extremity Ankle Circles/Pumps: AROM;Both;20 reps   Assessment/Plan    PT Assessment Patient needs continued PT services  PT Problem List Decreased activity tolerance;Decreased balance;Cardiopulmonary status limiting activity;Obesity;Pain          PT Treatment Interventions DME instruction;Gait training;Functional mobility training;Therapeutic activities;Therapeutic exercise;Balance training;Patient/family education    PT Goals (Current goals can be found in the Care Plan  section)  Acute Rehab PT Goals Patient Stated Goal: to get back to her normal level of moving and breathing.  PT Goal Formulation: With patient Time For Goal Achievement: 11/12/16 Potential to Achieve Goals: Good    Frequency Min 3X/week           End of Session Equipment Utilized During Treatment: Oxygen Activity Tolerance: Patient limited by fatigue;Patient limited by pain Patient left: in chair;with call bell/phone within reach           Time: 1101-1125 PT Time Calculation (min) (ACUTE ONLY): 24 min   Charges:   PT Evaluation $PT Eval Moderate Complexity: 1 Procedure PT Treatments $Gait Training: 8-22 mins        Krysti Hickling B. Renay Crammer, PT, DPT [phone]   10/29/2016, 11:38 AM

## 2016-10-29 NOTE — Progress Notes (Signed)
Lisa Hodges  SVX:[id number] DOB: 08-08-1986 DOA: 10/26/2016 PCP: No PCP Per Patient    Brief Narrative:  31 y.o. F w/ hx of hypertension, diabetes mellitus, and missed abortion in 2017 who presented with complaints of shortness of breath and worsening lower extremity edema over 4 weeks.  Her sx did not improve after being tx for PNA as an outpt.  Labs in the ED noted a troponin of 0.06, BNP of 867, chest x-ray significant for worsening multifocal infiltrates and small pleural effusion.  Subjective: The patient feels that her symptoms are slowly beginning to improve though she does still note significant swelling in her feet.  She denies chest pain nausea vomiting or abdominal pain.  Assessment & Plan: Active Problems:   Uncontrolled type 2 diabetes mellitus with hyperglycemia, without long-term current use of insulin (HCC)   Acute respiratory failure (HCC)   Acute systolic congestive heart failure (HCC)   AKI (acute kidney injury) (HCC)   Diabetes (HCC)   Hypertension   Cardiomyopathy-etiology not determined but suspect NICM   Newly diagnosed acute systolic CHF -TTE notes severe global reduction in LV systolic fxn w/ EF 30%, etiology not clear at present  -Cardiology following, diuresis continued. -Per cardiology notes, no plan for ischemic work up to this admission, consider cath versus Lexiscan as outpatient.  Acute hypoxic respiratory failure due to Pulmonary Edema  -Initially required up to 6 L of oxygen, currently weaned to 2 L, continue to wean oxygen as tolerated. -This is likely not secondary to infectious etiology and there is no fever or leukocytosis.  Rhinovirus URI -Confirmed on viral resp panel   HTN -Blood pressure is now well controlled - follow trend  DM -CBGs reasonably controlled, on carbohydrate modified diet and SSI. -Check A1c.  Acute kidney injury -The most recent creatinine baseline is from 10/26/2015 which was 1.1. -Presented with creatinine of 1.7  this time, this is stable at 1.7 for now.  Recent Labs Lab 10/26/16 0307 10/26/16 2055 10/27/16 0258 10/28/16 0242 10/29/16 0449  CREATININE 1.80* 1.71* 1.66* 1.65* 1.74*   Morbid Obesity - Body mass index is 45.1 kg/m.   DVT prophylaxis: SCDs Code Status: FULL CODE Family Communication: no family present at time of exam  Disposition Plan: Stable for transfer to CHF floor - continue diuresis - evaluate for renal function - educate patient - mobilize  Consultants:  [facility]   Procedures: 2/1 TTE  Antimicrobials:  Unasyn 2/1 > 2/2  Objective: Blood pressure 117/78, pulse (!) 104, temperature 97.9 F (36.6 C), resp. rate 20, height 4\' 9"  (1.448 m), weight 94.5 kg (208 lb 6.4 oz), last menstrual period 10/23/2016, SpO2 98 %, not currently breastfeeding.  Intake/Output Summary (Last 24 hours) at 10/29/16 1038 Last data filed at 10/29/16 0600  Gross per 24 hour  Intake              480 ml  Output             1475 ml  Net             -995 ml   Filed Weights   10/28/16 0350 10/28/16 2045 10/29/16 0542  Weight: 96.2 kg (212 lb 1.3 oz) 93.4 kg (206 lb) 94.5 kg (208 lb 6.4 oz)    Examination: General: No acute respiratory distress sitting up in chair Lungs: Diffuse fine crackles with poor air movement appreciable in all fields - no signif change in exam  Cardiovascular: Regular rate and rhythm without murmur  Abdomen:  Nontender, obese, soft, bowel sounds positive, no rebound, no ascites, no appreciable mass Extremities: 2+ pitting edema bilateral lower extremities persists   CBC:  Recent Labs Lab 10/26/16 0249 10/26/16 0307 10/26/16 2055 10/28/16 0242  WBC 6.9  --  5.8 4.9  NEUTROABS 5.9  --   --   --   HGB 10.5* 11.9* 10.1* 9.9*  HCT 34.0* 35.0* 33.1* 32.0*  MCV 61.7*  --  62.7* 62.3*  PLT 493*  --  499* 462*   Basic Metabolic Panel:  Recent Labs Lab 10/26/16 0307 10/26/16 2055 10/27/16 0258 10/28/16 0242 10/29/16 0449  NA 140  --  139 140 138    K 3.8  --  3.6 4.3 3.9  CL 108  --  107 107 104  CO2  --   --  22 25 25   GLUCOSE 223*  --  172* 151* 176*  BUN 20  --  19 20 23*  CREATININE 1.80* 1.71* 1.66* 1.65* 1.74*  CALCIUM  --   --  8.0* 7.9* 7.8*   GFR: Estimated Creatinine Clearance: 44.7 mL/min (by C-G formula based on SCr of 1.74 mg/dL (H)).  Liver Function Tests:  Recent Labs Lab 10/28/16 0242  AST 35  ALT 29  ALKPHOS 66  BILITOT 0.6  PROT 5.0*  ALBUMIN 1.3*   Cardiac Enzymes:  Recent Labs Lab 10/26/16 0819 10/26/16 1840 10/28/16 0242  TROPONINI 0.06* 0.06* 0.04*    CBG:  Recent Labs Lab 10/28/16 1050 10/28/16 1227 10/28/16 1817 10/28/16 2247 10/29/16 0823  GLUCAP 140* 135* 144* 155* 183*    Recent Results (from the past 240 hour(s))  Respiratory Panel by PCR     Status: Abnormal   Collection Time: 10/26/16  4:40 PM  Result Value Ref Range Status   Adenovirus NOT DETECTED NOT DETECTED Final   Coronavirus 229E NOT DETECTED NOT DETECTED Final   Coronavirus HKU1 NOT DETECTED NOT DETECTED Final   Coronavirus NL63 NOT DETECTED NOT DETECTED Final   Coronavirus OC43 NOT DETECTED NOT DETECTED Final   Metapneumovirus NOT DETECTED NOT DETECTED Final   Rhinovirus / Enterovirus DETECTED (A) NOT DETECTED Final   Influenza A NOT DETECTED NOT DETECTED Final   Influenza B NOT DETECTED NOT DETECTED Final   Parainfluenza Virus 1 NOT DETECTED NOT DETECTED Final   Parainfluenza Virus 2 NOT DETECTED NOT DETECTED Final   Parainfluenza Virus 3 NOT DETECTED NOT DETECTED Final   Parainfluenza Virus 4 NOT DETECTED NOT DETECTED Final   Respiratory Syncytial Virus NOT DETECTED NOT DETECTED Final   Bordetella pertussis NOT DETECTED NOT DETECTED Final   Chlamydophila pneumoniae NOT DETECTED NOT DETECTED Final   Mycoplasma pneumoniae NOT DETECTED NOT DETECTED Final    Comment: Performed at [facility], [address] [place], [place] [address]  MRSA PCR Screening     Status: None   Collection Time: 10/26/16   8:32 PM  Result Value Ref Range Status   MRSA by PCR NEGATIVE NEGATIVE Final    Comment:        The GeneXpert MRSA Assay (FDA approved for NASAL specimens only), is one component of a comprehensive MRSA colonization surveillance program. It is not intended to diagnose MRSA infection nor to guide or monitor treatment for MRSA infections.      Scheduled Meds: . carvedilol  3.125 mg Oral BID WC  . furosemide  60 mg Intravenous Q12H  . heparin  5,000 Units Subcutaneous Q8H  . insulin aspart  0-9 Units Subcutaneous TID WC  LOS: 3 days   Lonia Blood, MD [facility] Office  [phone] Pager - Text Page per Amion as per below:  On-Call/Text Page:      [email or web address]      password [email or web address]  If 7PM-7AM, please contact night-coverage [email or web address] Password [email or web address] 10/29/2016, 10:38 AM

## 2016-10-29 NOTE — Progress Notes (Addendum)
Progress Note  Patient Name: Lisa Hodges Date of Encounter: 10/29/2016  Primary Cardiologist: New Jennetta Flood  Subjective   She is breathing somewhat better again today.  Not at baseline. No pain.   Inpatient Medications    Scheduled Meds: . carvedilol  6.25 mg Oral BID WC  . furosemide  60 mg Intravenous Q12H  . heparin  5,000 Units Subcutaneous Q8H  . insulin aspart  0-9 Units Subcutaneous TID WC   Continuous Infusions:  PRN Meds: acetaminophen, albuterol, alum & mag hydroxide-simeth, guaiFENesin-dextromethorphan, hydrALAZINE, ondansetron (ZOFRAN) IV   Vital Signs    Vitals:   10/29/16 0542 10/29/16 0852 10/29/16 1101 10/29/16 1120  BP: 125/73 117/78    Pulse: (!) 104  (P) 100 (!) (P) 109  Resp: 20     Temp: 98 F (36.7 C) 97.9 F (36.6 C)    TempSrc: Oral     SpO2: 92% 98% (!) (P) 85% (P) 94%  Weight: 208 lb 6.4 oz (94.5 kg)     Height:        Intake/Output Summary (Last 24 hours) at 10/29/16 1129 Last data filed at 10/29/16 1100  Gross per 24 hour  Intake              600 ml  Output             1975 ml  Net            -1375 ml   Filed Weights   10/28/16 0350 10/28/16 2045 10/29/16 0542  Weight: 212 lb 1.3 oz (96.2 kg) 206 lb (93.4 kg) 208 lb 6.4 oz (94.5 kg)    Telemetry    NSR - Personally Reviewed  ECG    NA - Personally Reviewed  Physical Exam   GEN: No acute distress.   Neck: No JVD Cardiac: RRR, no murmurs, rubs, or gallops.  Respiratory: Clear to auscultation bilaterally. GI: Soft, nontender, non-distended  MS: Mild edema; No deformity. Neuro:  Nonfocal  Psych: Normal affect   Labs    Chemistry  Recent Labs Lab 10/27/16 0258 10/28/16 0242 10/29/16 0449  NA 139 140 138  K 3.6 4.3 3.9  CL 107 107 104  CO2 22 25 25   GLUCOSE 172* 151* 176*  BUN 19 20 23*  CREATININE 1.66* 1.65* 1.74*  CALCIUM 8.0* 7.9* 7.8*  PROT  --  5.0*  --   ALBUMIN  --  1.3*  --   AST  --  35  --   ALT  --  29  --   ALKPHOS  --  66  --   BILITOT   --  0.6  --   GFRNONAA 40* 40* 38*  GFRAA 46* 47* 44*  ANIONGAP 10 8 9      Hematology  Recent Labs Lab 10/26/16 0249 10/26/16 0307 10/26/16 2055 10/28/16 0242  WBC 6.9  --  5.8 4.9  RBC 5.51*  --  5.28* 5.14*  HGB 10.5* 11.9* 10.1* 9.9*  HCT 34.0* 35.0* 33.1* 32.0*  MCV 61.7*  --  62.7* 62.3*  MCH 19.1*  --  19.1* 19.3*  MCHC 30.9  --  30.5 30.9  RDW 16.2*  --  16.0* 16.0*  PLT 493*  --  499* 462*    Cardiac Enzymes  Recent Labs Lab 10/26/16 0819 10/26/16 1840 10/28/16 0242  TROPONINI 0.06* 0.06* 0.04*     Recent Labs Lab 10/26/16 0306  TROPIPOC 0.09*     BNP  Recent Labs Lab 10/26/16 0249 10/28/16 0242  BNP 867.1* 523.8*     DDimer No results for input(s): DDIMER in the last 168 hours.   Radiology    US Renal  Result Date: 10/28/2016 CLINICAL DATA:  Acute renal failure EXAM: RENAL / URINARY TRACT ULTRASOUND COMPLETE COMPARISON:  None. FINDINGS: Right Kidney: Length: 9.9 cm. Slight increased echogenicity is noted. No obstructive changes are seen. Left Kidney: Length: 10.1 cm. Echogenicity within normal limits. No mass or hydronephrosis visualized. Bladder: Well distended. Incidental note is made of bilateral pleural effusions. IMPRESSION: No obstructive changes are noted. Slight increased echogenicity of the right kidney is noted. Small bilateral pleural effusions. Electronically Signed   By: Alcide Clever M.D.   On: 10/28/2016 20:19    Cardiac Studies   Echo 10/26/16- Study Conclusions  - Left ventricle: The cavity size was normal. Wall thickness was increased in a pattern of mild LVH. Systolic function was moderately to severely reduced. The estimated ejection fraction was in the range of 30% to 35%. Diffuse hypokinesis. - Aortic valve: There was mild regurgitation. - Mitral valve: There was mild regurgitation. - Pericardium, extracardiac: A small pericardial effusion was identified.  Impressions:  - Moderate to severe global  reduction in LV systolic function; mild AI and MR; small to moderate pericardial effusion with RA collapse but no RV diastolic collapse and IVC not dilated.   Patient Profile     31 y.o. female with a history of HTN, DM, and recent CAP-came back tot he ED 10/26/16 with dyspnea. BNP 867, Troponin 0.06. Her CXR suggested worsening pneumonia but CHF was suspected as well. Echo showed cardiomyopathy with an EF of 30% (presumably NICM with global HK).   Assessment & Plan    ACUTE SYSTOLIC HEART FAILURE:    Beta blocker added yesterday and increased diuretic.   I am going to hold off onstarting ARB/ACE.  She is down 3 liters since admission. I do not plan an ischemia work up this admission but will consider cath vs [facility] as an out patient.    I will add low dose Imdur and hydralazine.   CKD STAGE III:   Creat is up slightly.  However she still has significant volume overload.  She is to keep her feet elevated and I am going to continue her current diuretic dose.  I will apply compression stockings.    HTN:   BP is controlled.      Signed, Rollene Rotunda, MD  10/29/2016, 11:29 AM

## 2016-10-29 NOTE — Progress Notes (Signed)
10/29/2016 SATURATION QUALIFICATIONS: (This note is used to comply with regulatory documentation for home oxygen)  Patient Saturations on Room Air at Rest = 85%  Patient Saturations on Room Air while Ambulating = 85%  Patient Saturations on 2 Liters of oxygen while Ambulating = 94%  Please briefly explain why patient needs home oxygen: Pt desaturates on RA at rest.  Lurena Joiner B. Tiffane Sheldon, PT, DPT [phone]

## 2016-10-30 LAB — BASIC METABOLIC PANEL
ANION GAP: 5 (ref 5–15)
BUN: 23 mg/dL — ABNORMAL HIGH (ref 6–20)
CALCIUM: 7.5 mg/dL — AB (ref 8.9–10.3)
CO2: 27 mmol/L (ref 22–32)
Chloride: 106 mmol/L (ref 101–111)
Creatinine, Ser: 1.97 mg/dL — ABNORMAL HIGH (ref 0.44–1.00)
GFR calc Af Amer: 38 mL/min — ABNORMAL LOW (ref >60)
GFR calc non Af Amer: 32 mL/min — ABNORMAL LOW (ref >60)
GLUCOSE: 196 mg/dL — AB (ref 65–99)
Potassium: 4.2 mmol/L (ref 3.5–5.1)
Sodium: 138 mmol/L (ref 135–145)

## 2016-10-30 LAB — CBC
HCT: 28.2 % — ABNORMAL LOW (ref 36.0–46.0)
HEMOGLOBIN: 8.5 g/dL — AB (ref 12.0–15.0)
MCH: 18.9 pg — AB (ref 26.0–34.0)
MCHC: 30.1 g/dL (ref 30.0–36.0)
MCV: 62.7 fL — ABNORMAL LOW (ref 78.0–100.0)
Platelets: 411 10*3/uL — ABNORMAL HIGH (ref 150–400)
RBC: 4.5 MIL/uL (ref 3.87–5.11)
RDW: 16.4 % — ABNORMAL HIGH (ref 11.5–15.5)
WBC: 5.1 10*3/uL (ref 4.0–10.5)

## 2016-10-30 LAB — GLUCOSE, CAPILLARY
GLUCOSE-CAPILLARY: 163 mg/dL — AB (ref 65–99)
GLUCOSE-CAPILLARY: 132 mg/dL — AB (ref 65–99)
Glucose-Capillary: 136 mg/dL — ABNORMAL HIGH (ref 65–99)
Glucose-Capillary: 147 mg/dL — ABNORMAL HIGH (ref 65–99)

## 2016-10-30 NOTE — Progress Notes (Signed)
Lisa Hodges  ZOX:[id number] DOB: [birth year] DOA: 10/26/2016 PCP: No PCP Per Patient    Brief Narrative:  31 y.o. F w/ hx of hypertension, diabetes mellitus, and missed abortion in 2017 who presented with complaints of shortness of breath and worsening lower extremity edema over 4 weeks.  Her sx did not improve after being tx for PNA as an outpt.  Labs in the ED noted a troponin of 0.06, BNP of 867, chest x-ray significant for worsening multifocal infiltrates and small pleural effusion.  Subjective: Reports she's feeling okay, denies shortness of breath, reported not sleeping well last night not sure it was orthopnea or not.  Assessment & Plan: Active Problems:   Uncontrolled type 2 diabetes mellitus with hyperglycemia, without long-term current use of insulin (HCC)   Acute respiratory failure (HCC)   Acute systolic congestive heart failure (HCC)   AKI (acute kidney injury) (HCC)   Diabetes (HCC)   Hypertension   Cardiomyopathy-etiology not determined but suspect NICM   Newly diagnosed acute systolic CHF -TTE notes severe global reduction in LV systolic fxn w/ EF 30%, etiology not clear at present  -Cardiology following, diuresis continued. -Per cardiology notes, no plan for ischemic work up to this admission, consider cath versus Lexiscan as outpatient.  Acute kidney injury -The most recent creatinine baseline is from 10/26/2015 which was 1.1. -Presented with creatinine of 1.7 this time, this is worsened slightly since yesterday creatinine 1.9.  Acute hypoxic respiratory failure due to Pulmonary Edema  -Initially required up to 6 L of oxygen, currently weaned to 2 L, continue to wean oxygen as tolerated. -This is likely not secondary to infectious etiology and there is no fever or leukocytosis.  Rhinovirus URI -Confirmed on viral resp panel   HTN -Blood pressure is now well controlled - follow trend  DM -CBGs reasonably controlled, on carbohydrate modified diet and  SSI. -Check A1c.  Morbid Obesity - Body mass index is 44.88 kg/m.    DVT prophylaxis: SCDs Code Status: FULL CODE Family Communication: no family present at time of exam  Disposition Plan: Stable for transfer to CHF floor - continue diuresis - evaluate for renal function - educate patient - mobilize  Consultants:  [facility]   Procedures: 2/1 TTE  Antimicrobials:  Unasyn 2/1 > 2/2  Objective: Blood pressure 126/70, pulse (!) 109, temperature 98.1 F (36.7 C), temperature source Oral, resp. rate 20, height 4\' 9"  (1.448 m), weight 94.1 kg (207 lb 6.4 oz), last menstrual period 10/23/2016, SpO2 98 %, not currently breastfeeding.  Intake/Output Summary (Last 24 hours) at 10/30/16 1155 Last data filed at 10/30/16 1031  Gross per 24 hour  Intake              700 ml  Output             2850 ml  Net            -2150 ml   Filed Weights   10/28/16 2045 10/29/16 0542 10/30/16 0615  Weight: 93.4 kg (206 lb) 94.5 kg (208 lb 6.4 oz) 94.1 kg (207 lb 6.4 oz)    Examination: General: No acute respiratory distress sitting up in chair Lungs: Diffuse fine crackles with poor air movement appreciable in all fields - no signif change in exam  Cardiovascular: Regular rate and rhythm without murmur  Abdomen: Nontender, obese, soft, bowel sounds positive, no rebound, no ascites, no appreciable mass Extremities: 2+ pitting edema bilateral lower extremities persists   CBC:  Recent Labs Lab  10/26/16 0249 10/26/16 0307 10/26/16 2055 10/28/16 0242 10/30/16 0337  WBC 6.9  --  5.8 4.9 5.1  NEUTROABS 5.9  --   --   --   --   HGB 10.5* 11.9* 10.1* 9.9* 8.5*  HCT 34.0* 35.0* 33.1* 32.0* 28.2*  MCV 61.7*  --  62.7* 62.3* 62.7*  PLT 493*  --  499* 462* 411*   Basic Metabolic Panel:  Recent Labs Lab 10/26/16 0307 10/26/16 2055 10/27/16 0258 10/28/16 0242 10/29/16 0449 10/30/16 0337  NA 140  --  139 140 138 138  K 3.8  --  3.6 4.3 3.9 4.2  CL 108  --  107 107 104 106  CO2  --    --  22 25 25 27   GLUCOSE 223*  --  172* 151* 176* 196*  BUN 20  --  19 20 23* 23*  CREATININE 1.80* 1.71* 1.66* 1.65* 1.74* 1.97*  CALCIUM  --   --  8.0* 7.9* 7.8* 7.5*   GFR: Estimated Creatinine Clearance: 39.4 mL/min (by C-G formula based on SCr of 1.97 mg/dL (H)).  Liver Function Tests:  Recent Labs Lab 10/28/16 0242  AST 35  ALT 29  ALKPHOS 66  BILITOT 0.6  PROT 5.0*  ALBUMIN 1.3*   Cardiac Enzymes:  Recent Labs Lab 10/26/16 0819 10/26/16 1840 10/28/16 0242  TROPONINI 0.06* 0.06* 0.04*    CBG:  Recent Labs Lab 10/29/16 0823 10/29/16 1208 10/29/16 1646 10/29/16 2141 10/30/16 0748  GLUCAP 183* 146* 143* 160* 163*    Recent Results (from the past 240 hour(s))  Respiratory Panel by PCR     Status: Abnormal   Collection Time: 10/26/16  4:40 PM  Result Value Ref Range Status   Adenovirus NOT DETECTED NOT DETECTED Final   Coronavirus 229E NOT DETECTED NOT DETECTED Final   Coronavirus HKU1 NOT DETECTED NOT DETECTED Final   Coronavirus NL63 NOT DETECTED NOT DETECTED Final   Coronavirus OC43 NOT DETECTED NOT DETECTED Final   Metapneumovirus NOT DETECTED NOT DETECTED Final   Rhinovirus / Enterovirus DETECTED (A) NOT DETECTED Final   Influenza A NOT DETECTED NOT DETECTED Final   Influenza B NOT DETECTED NOT DETECTED Final   Parainfluenza Virus 1 NOT DETECTED NOT DETECTED Final   Parainfluenza Virus 2 NOT DETECTED NOT DETECTED Final   Parainfluenza Virus 3 NOT DETECTED NOT DETECTED Final   Parainfluenza Virus 4 NOT DETECTED NOT DETECTED Final   Respiratory Syncytial Virus NOT DETECTED NOT DETECTED Final   Bordetella pertussis NOT DETECTED NOT DETECTED Final   Chlamydophila pneumoniae NOT DETECTED NOT DETECTED Final   Mycoplasma pneumoniae NOT DETECTED NOT DETECTED Final    Comment: Performed at [facility], [address], [place], [place] [address]  MRSA PCR Screening     Status: None   Collection Time: 10/26/16  8:32 PM  Result Value Ref Range  Status   MRSA by PCR NEGATIVE NEGATIVE Final    Comment:        The GeneXpert MRSA Assay (FDA approved for NASAL specimens only), is one component of a comprehensive MRSA colonization surveillance program. It is not intended to diagnose MRSA infection nor to guide or monitor treatment for MRSA infections.      Scheduled Meds: . carvedilol  6.25 mg Oral BID WC  . furosemide  60 mg Intravenous Q12H  . heparin  5,000 Units Subcutaneous Q8H  . hydrALAZINE  10 mg Oral Q8H  . insulin aspart  0-9 Units Subcutaneous TID WC  .  isosorbide mononitrate  15 mg Oral Daily     LOS: 4 days   Lonia Blood, MD [facility] Office  [phone] Pager - Text Page per Amion as per below:  On-Call/Text Page:      [email or web address]      password [email or web address]  If 7PM-7AM, please contact night-coverage [email or web address] Password [email or web address] 10/30/2016, 11:55 AM

## 2016-10-30 NOTE — Consult Note (Signed)
Progress Note  Patient Name: Lisa Hodges Date of Encounter: 10/30/2016  Primary Cardiologist: Dr. Antoine Poche  Subjective   No chest pain and no SOB, continues with edema, stockings to be placed today.  Walked in hall with PT yesterday and last evening on her own.  sp02 on RA 85%.  HR did climb to 120s with exertion.   Inpatient Medications    Scheduled Meds: . carvedilol  6.25 mg Oral BID WC  . furosemide  60 mg Intravenous Q12H  . heparin  5,000 Units Subcutaneous Q8H  . hydrALAZINE  10 mg Oral Q8H  . insulin aspart  0-9 Units Subcutaneous TID WC  . isosorbide mononitrate  15 mg Oral Daily   Continuous Infusions:  PRN Meds: acetaminophen, albuterol, alum & mag hydroxide-simeth, guaiFENesin-dextromethorphan, hydrALAZINE, ondansetron (ZOFRAN) IV   Vital Signs    Vitals:   10/29/16 1303 10/29/16 1800 10/29/16 2100 10/30/16 0615  BP: 102/70 116/78 (!) 101/55 126/70  Pulse:   99 (!) 109  Resp:   20 20  Temp:   97.8 F (36.6 C) 98.1 F (36.7 C)  TempSrc:   Oral Oral  SpO2:   99% 98%  Weight:    207 lb 6.4 oz (94.1 kg)  Height:        Intake/Output Summary (Last 24 hours) at 10/30/16 0944 Last data filed at 10/30/16 0600  Gross per 24 hour  Intake              580 ml  Output             2500 ml  Net            -1920 ml   Filed Weights   10/28/16 2045 10/29/16 0542 10/30/16 0615  Weight: 206 lb (93.4 kg) 208 lb 6.4 oz (94.5 kg) 207 lb 6.4 oz (94.1 kg)    Telemetry    SR to ST with exertion - Personally Reviewed  ECG    None since 10/26/16 - Personally Reviewed  Physical Exam   GEN: No acute distress.  Sitting up in chair with legs elevated Neck: No JVD Cardiac: RRR, no murmurs, rubs, or gallops.  Respiratory: Clear to auscultation bilaterally. GI: Soft, nontender, non-distended  MS: 2+ edema bil lower ext edema; No deformity. Neuro:  Nonfocal, MAE, follows commands  Psych: Normal affect   Labs    Chemistry Recent Labs Lab 10/28/16 0242  10/29/16 0449 10/30/16 0337  NA 140 138 138  K 4.3 3.9 4.2  CL 107 104 106  CO2 25 25 27   GLUCOSE 151* 176* 196*  BUN 20 23* 23*  CREATININE 1.65* 1.74* 1.97*  CALCIUM 7.9* 7.8* 7.5*  PROT 5.0*  --   --   ALBUMIN 1.3*  --   --   AST 35  --   --   ALT 29  --   --   ALKPHOS 66  --   --   BILITOT 0.6  --   --   GFRNONAA 40* 38* 32*  GFRAA 47* 44* 38*  ANIONGAP 8 9 5      Hematology Recent Labs Lab 10/26/16 2055 10/28/16 0242 10/30/16 0337  WBC 5.8 4.9 5.1  RBC 5.28* 5.14* 4.50  HGB 10.1* 9.9* 8.5*  HCT 33.1* 32.0* 28.2*  MCV 62.7* 62.3* 62.7*  MCH 19.1* 19.3* 18.9*  MCHC 30.5 30.9 30.1  RDW 16.0* 16.0* 16.4*  PLT 499* 462* 411*    Cardiac Enzymes Recent Labs Lab 10/26/16 0819 10/26/16 1840 10/28/16 0242  TROPONINI 0.06* 0.06* 0.04*    Recent Labs Lab 10/26/16 0306  TROPIPOC 0.09*     BNP Recent Labs Lab 10/26/16 0249 10/28/16 0242  BNP 867.1* 523.8*     DDimer No results for input(s): DDIMER in the last 168 hours.   Radiology    US Renal  Result Date: 10/28/2016 CLINICAL DATA:  Acute renal failure EXAM: RENAL / URINARY TRACT ULTRASOUND COMPLETE COMPARISON:  None. FINDINGS: Right Kidney: Length: 9.9 cm. Slight increased echogenicity is noted. No obstructive changes are seen. Left Kidney: Length: 10.1 cm. Echogenicity within normal limits. No mass or hydronephrosis visualized. Bladder: Well distended. Incidental note is made of bilateral pleural effusions. IMPRESSION: No obstructive changes are noted. Slight increased echogenicity of the right kidney is noted. Small bilateral pleural effusions. Electronically Signed   By: Alcide Clever M.D.   On: 10/28/2016 20:19    Cardiac Studies   Echo 10/26/16- Study Conclusions  - Left ventricle: The cavity size was normal. Wall thickness was increased in a pattern of mild LVH. Systolic function was moderately to severely reduced. The estimated ejection fraction was in the range of 30% to 35%. Diffuse  hypokinesis. - Aortic valve: There was mild regurgitation. - Mitral valve: There was mild regurgitation. - Pericardium, extracardiac: A small pericardial effusion was identified.  Impressions:  - Moderate to severe global reduction in LV systolic function; mild AI and MR; small to moderate pericardial effusion with RA collapse but no RV diastolic collapse and IVC not dilated.   Patient Profile     31 y.o. female with a history of HTN, DM, and recent CAP-came back tot he ED 10/26/16 with dyspnea. BNP 867, Troponin 0.06. Her CXR suggested worsening pneumonia but CHF was suspected as well. Echo showed cardiomyopathy with an EF of 30% (presumably NICM with global HK).   Assessment & Plan    ACUTE SYSTOLIC HEART FAILURE:    Beta blocker and increased diuretic.   per Dr. Antoine Poche " am going to hold off onstarting ARB/ACE.  She is down 4.7 liters since admission. I do not plan an ischemia work up this admission but will consider cath vs [facility] as an out patient."   low dose imdur and hydralazine added. BP soft at times 101/55 to 126/70 -wt is down from 216 lbs to 207  -on lasix 60 mg every 12 hours -will ask dietary to discuss low salt diet   CKD:   Creat is up slightly. 1.71--> 1.81-->1.66 -->1.65--> 1.74 --> 1.97 today However she still has significant volume overload.  She is to keep her feet elevated, continue to diuresis, Dr. Elease Hashimoto to see.   And compression stockings were added.    HTN:   BP is controlled.    Anemia  Hgb 10.5 now hgb 8.5   Pt has daughter with cerebral palsy at home 58 years old, she has physical work to do to care for her.  She does have help.    Signed, Nada Boozer, NP  10/30/2016, 9:44 AM    Attending Note:   The patient was seen and examined.  Agree with assessment and plan as noted above.  Changes made to the above note as needed.  Patient seen and independently examined with Nada Boozer, NP.   We discussed all aspects of the  encounter. I agree with the assessment and plan as stated above.  1. Acute on chronic systolic CHF Complicated by CKD - Creatine is 1.97 She is feeling better  continue coreg and lasix  Dr. Antoine Poche to consider ARB or ACE-I as OK BP is too low currently to start those or hydralazine  I/O is -5.5 liters Continue to diurese.   Hopefully home in several days     I have spent a total of 30 minutes with patient reviewing hospital  notes , telemetry, EKGs, labs and examining patient as well as establishing an assessment and plan that was discussed with the patient. > 50% of time was spent in direct patient care.    Vesta Mixer, Montez Hageman., MD, [facility] 10/30/2016, 1:04 PM [address]  [address] Office [phone] Pager [phone]

## 2016-10-30 NOTE — Progress Notes (Signed)
Nutrition Education Note  RD consulted for nutrition education for low sodium diet. Pt with HTN, acute systolic heart failure, and history of DM.   RD provided "Low Sodium Nutrition Therapy" handout from the [employer]. Pt expressed that she was very eager to learn and was already set on not using any more salt. Her boyfriend lives with her and plans to follow a low sodium diet with her. Reviewed patient's dietary recall. Provided examples on ways to decrease sodium intake in diet. Reviewed sodium guidelines and emphasized the importance of always checking nutrition labels for sodium content. Discouraged intake of processed foods and use of salt shaker. Encouraged fresh fruits and vegetables as well as whole grain sources of carbohydrates to maximize fiber intake.   RD discussed why it is important for patient to adhere to diet recommendations, and emphasized foods to avoid and importance of weighing self daily. Teach back method used. Pt states that she already has Mrs. Dash and home and plans to use this. She loves fruit, steamed vegetables, and salad and plans to add these to meals more often. RD provided homemade low sodium recipe ideas.   Expect very good compliance.  Body mass index is 44.88 kg/m. Pt meets criteria for Morbid Obesity based on current BMI.  Current diet order is Heart Healthy/Carb Modified, patient is consuming approximately 100% of meals at this time. Labs and medications reviewed. Pt states that her blood glucose PTA was running in the 200's because she wasn't taking her medication. She reports understanding of carb modified diet and denies any additional questions at this time. No further nutrition interventions warranted at this time. RD contact information provided. If additional nutrition issues arise, please re-consult RD.   Dorothea Ogle RD, CSP, LDN Inpatient Clinical Dietitian Pager: [phone] After Hours Pager: [phone]

## 2016-10-31 DIAGNOSIS — I5032 Chronic diastolic (congestive) heart failure: Secondary | ICD-10-CM

## 2016-10-31 DIAGNOSIS — I428 Other cardiomyopathies: Secondary | ICD-10-CM

## 2016-10-31 LAB — GLUCOSE, CAPILLARY
GLUCOSE-CAPILLARY: 160 mg/dL — AB (ref 65–99)
Glucose-Capillary: 152 mg/dL — ABNORMAL HIGH (ref 65–99)
Glucose-Capillary: 173 mg/dL — ABNORMAL HIGH (ref 65–99)
Glucose-Capillary: 186 mg/dL — ABNORMAL HIGH (ref 65–99)

## 2016-10-31 LAB — BASIC METABOLIC PANEL
ANION GAP: 11 (ref 5–15)
ANION GAP: 11 (ref 5–15)
BUN: 24 mg/dL — ABNORMAL HIGH (ref 6–20)
BUN: 24 mg/dL — AB (ref 6–20)
CALCIUM: 7.7 mg/dL — AB (ref 8.9–10.3)
CHLORIDE: 101 mmol/L (ref 101–111)
CO2: 26 mmol/L (ref 22–32)
CO2: 27 mmol/L (ref 22–32)
Calcium: 7.9 mg/dL — ABNORMAL LOW (ref 8.9–10.3)
Chloride: 99 mmol/L — ABNORMAL LOW (ref 101–111)
Creatinine, Ser: 1.83 mg/dL — ABNORMAL HIGH (ref 0.44–1.00)
Creatinine, Ser: 1.84 mg/dL — ABNORMAL HIGH (ref 0.44–1.00)
GFR calc Af Amer: 41 mL/min — ABNORMAL LOW (ref >60)
GFR calc non Af Amer: 36 mL/min — ABNORMAL LOW (ref >60)
GFR, EST AFRICAN AMERICAN: 41 mL/min — AB (ref >60)
GFR, EST NON AFRICAN AMERICAN: 35 mL/min — AB (ref >60)
Glucose, Bld: 164 mg/dL — ABNORMAL HIGH (ref 65–99)
Glucose, Bld: 151 mg/dL — ABNORMAL HIGH (ref 65–99)
POTASSIUM: 4.2 mmol/L (ref 3.5–5.1)
Potassium: 4.2 mmol/L (ref 3.5–5.1)
SODIUM: 137 mmol/L (ref 135–145)
Sodium: 138 mmol/L (ref 135–145)

## 2016-10-31 LAB — HEMOGLOBIN A1C
Hgb A1c MFr Bld: 10.1 % — ABNORMAL HIGH (ref 4.8–5.6)
Mean Plasma Glucose: 243 mg/dL

## 2016-10-31 MED ORDER — LIVING WELL WITH DIABETES BOOK
Freq: Once | Status: AC
  Administered 2016-10-31: 16:00:00
  Filled 2016-10-31: qty 1

## 2016-10-31 NOTE — Progress Notes (Signed)
MD called regarding pt morning lab draw. Lab at pt bedside 1012, will await results  [name]

## 2016-10-31 NOTE — Progress Notes (Signed)
Physical Therapy Treatment Patient Details Name: Lisa Hodges MRN: [id number] DOB: 03/25/1986 Today's Date: 10/31/2016    History of Present Illness 31 y.o. female admitted to [facility] on 10/26/16 for SOB.  Newly dx with acute systolic CHF, acute hypoxic respiratory failure due to pulmonary edema, and rhinovirus URI, and acute kidney injury.  Pt with significant PMHx of DM, and HTN.    PT Comments    Pt unable to participate in ambulation without O2, dropping to 84% on RA.  Maintained 91%-95% on 2L supplemental O2 [place] for remainder of treatment with no c/o of SOB or chest tightness. Pt willing to participate with treatment but limited due to fatigue; SPTA instructed pt to self monitor and allow rest breaks to avoid over exertion and extreme fatigue.  Also educated pt on performing ankle pumps while sitting with feet elevated to prevent LE edema. Pt would continue to benefit from skilled PT to progress ambulation and LE strengthening exercises while monitoring vitals.     Follow Up Recommendations  No PT follow up     Equipment Recommendations  Other (comment) (possible home O2?)    Recommendations for Other Services       Precautions / Restrictions Precautions Precautions: Other (comment) Precaution Comments: monitor O2 sats Restrictions Weight Bearing Restrictions: No    Mobility  Bed Mobility               General bed mobility comments: pt sitting in chair upon arrival  Transfers Overall transfer level: Modified independent Equipment used: None                Ambulation/Gait Ambulation/Gait assistance: Supervision Ambulation Distance (Feet): 180 Feet   Gait Pattern/deviations: Step-through pattern;Decreased step length - right;Decreased step length - left;Narrow base of support Gait velocity: decreased   General Gait Details: O2 sats dropped to 84% on RA; pt given 2L of supplemental O2 [place] during ambulation to bring above 90%. No cuing required for gait pattern;  education for self-monitoring when feeling fatigued. Pt O2 dropped to 88% on 2L O2 at end of ambulation but quickly rose after encouraging pt with pursed lip breathing.    Stairs            Wheelchair Mobility    Modified Rankin (Stroke Patients Only)       Balance Overall balance assessment: Needs assistance   Sitting balance-Leahy Scale: Normal     Standing balance support: Single extremity supported;During functional activity Standing balance-Leahy Scale: Good Standing balance comment: pt able to perform dynamic standing exercises with intermittent UE support. NO LOB noted throughout treatment but does need cuing to self montior exertion.                     Cognition Arousal/Alertness: Awake/alert Behavior During Therapy: WFL for tasks assessed/performed Overall Cognitive Status: Within Functional Limits for tasks assessed                      Exercises Total Joint Exercises Hip ABduction/ADduction: Standing;10 reps;Both Marching in Standing: 10 reps;Standing;Both Standing Hip Extension: 10 reps;Standing;Both General Exercises - Lower Extremity Mini-Sqauts: 10 reps;Standing Other Exercises Other Exercises: HS Curls x10 reps in standing    General Comments        Pertinent Vitals/Pain Pain Assessment: No/denies pain    Home Living                      Prior Function  PT Goals (current goals can now be found in the care plan section) Acute Rehab PT Goals Patient Stated Goal: to be able to walk without O2 PT Goal Formulation: With patient Potential to Achieve Goals: Good Progress towards PT goals: Progressing toward goals    Frequency    Min 3X/week      PT Plan Current plan remains appropriate    Co-evaluation             End of Session Equipment Utilized During Treatment: Oxygen;Gait belt Activity Tolerance: Patient tolerated treatment well;Patient limited by fatigue Patient left: in chair;with  call bell/phone within reach     Time: 3729-0211 PT Time Calculation (min) (ACUTE ONLY): 31 min  Charges:  $Gait Training: 8-22 mins $Therapeutic Exercise: 8-22 mins                    G Codes:      [facility] November 10, 2016, 4:42 PM  Kerrin Mo, [place] Pager [phone]

## 2016-10-31 NOTE — Progress Notes (Signed)
Progress Note  Patient Name: Lisa Hodges Date of Encounter: 10/31/2016  Primary Cardiologist: Dr. Daiva Nakayama   Subjective   No chest pain and improved SOB and edema, though she had some wheezes this AM.  Inpatient Medications    Scheduled Meds: . carvedilol  6.25 mg Oral BID WC  . furosemide  60 mg Intravenous Q12H  . heparin  5,000 Units Subcutaneous Q8H  . hydrALAZINE  10 mg Oral Q8H  . insulin aspart  0-9 Units Subcutaneous TID WC  . isosorbide mononitrate  15 mg Oral Daily   Continuous Infusions:  PRN Meds: acetaminophen, albuterol, alum & mag hydroxide-simeth, guaiFENesin-dextromethorphan, hydrALAZINE, ondansetron (ZOFRAN) IV   Vital Signs    Vitals:   10/30/16 0615 10/30/16 1743 10/30/16 2128 10/31/16 0618  BP: 126/70 124/81 126/86 129/83  Pulse: (!) 109 (!) 103 97 100  Resp: 20 18 18 18   Temp: 98.1 F (36.7 C)  98.2 F (36.8 C) 97.9 F (36.6 C)  TempSrc: Oral Oral Oral Oral  SpO2: 98% 97% 97% 90%  Weight: 207 lb 6.4 oz (94.1 kg)   204 lb (92.5 kg)  Height:        Intake/Output Summary (Last 24 hours) at 10/31/16 1024 Last data filed at 10/31/16 0849  Gross per 24 hour  Intake              480 ml  Output             1900 ml  Net            -1420 ml   Filed Weights   10/29/16 0542 10/30/16 0615 10/31/16 0618  Weight: 208 lb 6.4 oz (94.5 kg) 207 lb 6.4 oz (94.1 kg) 204 lb (92.5 kg)    Telemetry    SR to ST - Personally Reviewed  ECG    No new since 10/29/16 - Personally Reviewed  Physical Exam   GEN: No acute distress.  Sitting in chair Neck: No JVD Cardiac: RRR, no murmurs, rubs, or gallops.  Respiratory: Clear to auscultation bilaterally. GI: obese,Soft, nontender, non-distended  MS:  2+ Lower ext edema but improved; No deformity. Neuro:  Nonfocal  Psych: Normal affect   Labs    Chemistry Recent Labs Lab 10/28/16 0242 10/29/16 0449 10/30/16 0337  NA 140 138 138  K 4.3 3.9 4.2  CL 107 104 106  CO2 25 25 27   GLUCOSE 151*  176* 196*  BUN 20 23* 23*  CREATININE 1.65* 1.74* 1.97*  CALCIUM 7.9* 7.8* 7.5*  PROT 5.0*  --   --   ALBUMIN 1.3*  --   --   AST 35  --   --   ALT 29  --   --   ALKPHOS 66  --   --   BILITOT 0.6  --   --   GFRNONAA 40* 38* 32*  GFRAA 47* 44* 38*  ANIONGAP 8 9 5      Hematology Recent Labs Lab 10/26/16 2055 10/28/16 0242 10/30/16 0337  WBC 5.8 4.9 5.1  RBC 5.28* 5.14* 4.50  HGB 10.1* 9.9* 8.5*  HCT 33.1* 32.0* 28.2*  MCV 62.7* 62.3* 62.7*  MCH 19.1* 19.3* 18.9*  MCHC 30.5 30.9 30.1  RDW 16.0* 16.0* 16.4*  PLT 499* 462* 411*    Cardiac Enzymes Recent Labs Lab 10/26/16 0819 10/26/16 1840 10/28/16 0242  TROPONINI 0.06* 0.06* 0.04*    Recent Labs Lab 10/26/16 0306  TROPIPOC 0.09*     BNP Recent Labs Lab 10/26/16  0249 10/28/16 0242  BNP 867.1* 523.8*     DDimer No results for input(s): DDIMER in the last 168 hours.   Radiology    No results found.  Cardiac Studies    Echo 10/26/16- Study Conclusions  - Left ventricle: The cavity size was normal. Wall thickness was increased in a pattern of mild LVH. Systolic function was moderately to severely reduced. The estimated ejection fraction was in the range of 30% to 35%. Diffuse hypokinesis. - Aortic valve: There was mild regurgitation. - Mitral valve: There was mild regurgitation. - Pericardium, extracardiac: A small pericardial effusion was identified.  Impressions:  - Moderate to severe global reduction in LV systolic function; mild AI and MR; small to moderate pericardial effusion with RA collapse but no RV diastolic collapse and IVC not dilated.    Patient Profile     31 y.o. female with a history of HTN, DM, and recent CAP-came back tot he ED 10/26/16 with dyspnea. BNP 867, Troponin 0.06. Her CXR suggested worsening pneumonia but CHF was suspected as well. Echo showed cardiomyopathy with an EF of 30% (presumably NICM with global HK).   Assessment & Plan    ACUTE SYSTOLIC  HEART FAILURE: Beta blocker and increased diuretic. per Dr. Antoine Poche " am going to hold off onstarting ARB/ACE. She is down 6.7 liters since admission. I do not plan an ischemia work up this admission but will consider cath vs [facility] as an out patient." low dose imdur and hydralazine added.  On BB coreg at 6.25 BID.  BP improved now 120s systolic more consistently  -wt is down from 216 lbs to 204  -on lasix 60 mg every 12 hours- continue for today and ? Change to po tomorrow.   -will ask dietary to discuss low salt diet  -on hydralazine 10 mg TID , imdur 15 mg, coreg 6.25 mg BID  CKD: Creat is up slightly. 1.71-->1.81-->1.66 -->1.65--> 1.74 --> 1.97 with awaiting results for today 1.83  .  She still has volume overload but improving. She is  keeping her feet elevated, continue to diuresis, Dr. Elease Hashimoto to see.   And compression stockings were added.   HTN: BP is controlled.   Anemia  Hgb 10.5 now hgb 8.5   diabetes per IM  Pt has daughter with cerebral palsy at home 76 years old, she has physical work to do to care for her.  She does have help.   Signed, Lisa Boozer, NP  10/31/2016, 10:24 AM    Attending Note:   The patient was seen and examined.  Agree with assessment and plan as noted above.  Changes made to the above note as needed.  Patient seen and independently examined with Lisa Hodges, PGY-2.   We discussed all aspects of the encounter. I agree with the assessment and plan as stated above.  . 1. Acute on chronic diastolic dysfunction. She has diuresed 7.4 L so far this admission. Her creatinine has increased slightly since February 3.  2. 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 Her creatinine is up slightly. She has excess volume still. We'll continue diuresis. She needs to elevate her legs.    I have spent a total of 40 minutes with patient reviewing hospital  notes , telemetry, EKGs, labs and examining  patient as well as establishing an assessment and plan that was discussed with the patient. > 50% of time was spent in direct patient care.    Lisa Hodges.,  MD, [facility] 10/31/2016, 1:12 PM [address]  [address] Office [phone] Pager [phone]

## 2016-10-31 NOTE — Progress Notes (Signed)
Pt slept well during the night, Vitals stable, one time PRN dose of Robitussin provided for complaining of pain. no any sign of SOB and distress noted, no any complain of pain, will continue to monitor the patient.

## 2016-10-31 NOTE — Progress Notes (Signed)
SATURATION QUALIFICATIONS: (This note is used to comply with regulatory documentation for home oxygen)  Patient Saturations on Room Air at Rest = 93%  Patient Saturations on Room Air while Ambulating = 84%  Patient Saturations on 2 Liters of oxygen while Ambulating = 91%-95%  Please briefly explain why patient needs home oxygen: Desaturated with activity.    Joycelyn Rua, PTA pager [phone]

## 2016-10-31 NOTE — Progress Notes (Signed)
Inpatient Diabetes Program Recommendations  AACE/ADA: New Consensus Statement on Inpatient Glycemic Control (2015)  Target Ranges:  Prepandial:   less than 140 mg/dL      Peak postprandial:   less than 180 mg/dL (1-2 hours)      Critically ill patients:  140 - 180 mg/dL   Lab Results  Component Value Date   GLUCAP 160 (H) 10/31/2016   HGBA1C 10.1 (H) 10/30/2016    Spoke with patient at bedside regarding self-management of diabetes.  1.  Taught patient the following (teach back and/or return demonstration):  Medications (what these are, why taking, when taking, how taking, common S.E.'s)  CBG monitoring, A1C  How/why to check feet every day  Why exercise is important - PT seeing patient   Carb modified diet - RD has seen patient * Patient states she is paying for Metformin as she has no insurance, noted CM has gone over where to get medications if uninsured.  CM also discussed where to find a PCP, however patient still has questions regarding PCP and how to get medications.  CM consult made.  Also, LWWD book and outpatient classes ordered.  2.  Identified barriers and facilitators to self-management goals:  No insurance  Able to teach back back information on DM management  Has disabled daughter  3.  Identified support systems:  None mentioned  Thank you,  Kristine Linea, RN, MSN Diabetes Coordinator Inpatient Diabetes Program [phone] (Team Pager)

## 2016-10-31 NOTE — Progress Notes (Signed)
Education provided at bedside with patient regarding living with diabetes and living with heart failure. Demonstration and teach back, pt verbalized understanding and stated she would read over the material and call if she had questions  Veretta Sabourin Elige Radon

## 2016-10-31 NOTE — Progress Notes (Signed)
Lisa Hodges  HKV:[id number] DOB: 1985/11/22 DOA: 10/26/2016 PCP: No PCP Per Patient    Brief Narrative:  31 y.o. F w/ hx of hypertension, diabetes mellitus, and missed abortion in 2017 who presented with complaints of shortness of breath and worsening lower extremity edema over 4 weeks.  Her sx did not improve after being tx for PNA as an outpt.  Labs in the ED noted a troponin of 0.06, BNP of 867, chest x-ray significant for worsening multifocal infiltrates and small pleural effusion.  Subjective: Not on oxygen this morning, oxygen saturation is barely 90%. Had significant shortness of breath and tachycardia with ambulation yesterday.  Assessment & Plan: Active Problems:   Uncontrolled type 2 diabetes mellitus with hyperglycemia, without long-term current use of insulin (HCC)   Acute respiratory failure (HCC)   Acute systolic congestive heart failure (HCC)   AKI (acute kidney injury) (HCC)   Diabetes (HCC)   Hypertension   Cardiomyopathy-etiology not determined but suspect NICM   Newly diagnosed acute systolic CHF -TTE notes severe global reduction in LV systolic fxn w/ EF 30%, etiology not clear at present  -Per cardiology notes, no plan for ischemic work up to this admission, consider cath versus Lexiscan as outpatient. -Cardiology following, continue diuresis, compression stockings added. -Currently on Coreg, Imdur and hydralazine. Lasix 60 mg IV twice a day for diuresis.  Acute kidney injury -The most recent creatinine baseline is from 10/26/2015 which was 1.1. -Presented with creatinine of 1.7, creatinine was worsening slightly with the diuresis, BMP is pending today.  Acute hypoxic respiratory failure due to Pulmonary Edema  -Initially required up to 6 L of oxygen, currently weaned to 2 L, continue to wean oxygen as tolerated. -A hypoxia is likely secondary to fluid overload rather than the rhinovirus URI. -Continue to wean off of oxygen.  Rhinovirus URI -Confirmed on  viral resp panel   HTN -Blood pressure is now well controlled - follow trend  DM type II uncontrolled -CBGs reasonably controlled in the hospital, on carbohydrate modified diet and SSI. -Hemoglobin A1c is 10.1, correlate with mean plasma glucose of 243, indicates poor glycemic control as outpatient.  Morbid Obesity - Body mass index is 44.15 kg/m.    DVT prophylaxis: SCDs Code Status: FULL CODE Family Communication: no family present at time of exam  Disposition Plan: Continue diuresis, await cardiology recommendation.  Consultants:  [facility]   Procedures: 2/1 TTE  Antimicrobials:  Unasyn 2/1 > 2/2  Objective: Blood pressure 129/83, pulse 100, temperature 97.9 F (36.6 C), temperature source Oral, resp. rate 18, height 4\' 9"  (1.448 m), weight 92.5 kg (204 lb), last menstrual period 10/23/2016, SpO2 90 %, not currently breastfeeding.  Intake/Output Summary (Last 24 hours) at 10/31/16 1055 Last data filed at 10/31/16 0849  Gross per 24 hour  Intake              360 ml  Output             1900 ml  Net            -1540 ml   Filed Weights   10/29/16 0542 10/30/16 0615 10/31/16 0618  Weight: 94.5 kg (208 lb 6.4 oz) 94.1 kg (207 lb 6.4 oz) 92.5 kg (204 lb)    Examination: General: No acute respiratory distress sitting up in chair Lungs: Diffuse fine crackles with poor air movement appreciable in all fields - no signif change in exam  Cardiovascular: Regular rate and rhythm without murmur  Abdomen: Nontender, obese,  soft, bowel sounds positive, no rebound, no ascites, no appreciable mass Extremities: 2+ pitting edema bilateral lower extremities persists   CBC:  Recent Labs Lab 10/26/16 0249 10/26/16 0307 10/26/16 2055 10/28/16 0242 10/30/16 0337  WBC 6.9  --  5.8 4.9 5.1  NEUTROABS 5.9  --   --   --   --   HGB 10.5* 11.9* 10.1* 9.9* 8.5*  HCT 34.0* 35.0* 33.1* 32.0* 28.2*  MCV 61.7*  --  62.7* 62.3* 62.7*  PLT 493*  --  499* 462* 411*   Basic Metabolic  Panel:  Recent Labs Lab 10/26/16 0307 10/26/16 2055 10/27/16 0258 10/28/16 0242 10/29/16 0449 10/30/16 0337  NA 140  --  139 140 138 138  K 3.8  --  3.6 4.3 3.9 4.2  CL 108  --  107 107 104 106  CO2  --   --  22 25 25 27   GLUCOSE 223*  --  172* 151* 176* 196*  BUN 20  --  19 20 23* 23*  CREATININE 1.80* 1.71* 1.66* 1.65* 1.74* 1.97*  CALCIUM  --   --  8.0* 7.9* 7.8* 7.5*   GFR: Estimated Creatinine Clearance: 39 mL/min (by C-G formula based on SCr of 1.97 mg/dL (H)).  Liver Function Tests:  Recent Labs Lab 10/28/16 0242  AST 35  ALT 29  ALKPHOS 66  BILITOT 0.6  PROT 5.0*  ALBUMIN 1.3*   Cardiac Enzymes:  Recent Labs Lab 10/26/16 0819 10/26/16 1840 10/28/16 0242  TROPONINI 0.06* 0.06* 0.04*    CBG:  Recent Labs Lab 10/30/16 0748 10/30/16 1139 10/30/16 1655 10/30/16 2111 10/31/16 0747  GLUCAP 163* 136* 132* 147* 152*    Recent Results (from the past 240 hour(s))  Respiratory Panel by PCR     Status: Abnormal   Collection Time: 10/26/16  4:40 PM  Result Value Ref Range Status   Adenovirus NOT DETECTED NOT DETECTED Final   Coronavirus 229E NOT DETECTED NOT DETECTED Final   Coronavirus HKU1 NOT DETECTED NOT DETECTED Final   Coronavirus NL63 NOT DETECTED NOT DETECTED Final   Coronavirus OC43 NOT DETECTED NOT DETECTED Final   Metapneumovirus NOT DETECTED NOT DETECTED Final   Rhinovirus / Enterovirus DETECTED (A) NOT DETECTED Final   Influenza A NOT DETECTED NOT DETECTED Final   Influenza B NOT DETECTED NOT DETECTED Final   Parainfluenza Virus 1 NOT DETECTED NOT DETECTED Final   Parainfluenza Virus 2 NOT DETECTED NOT DETECTED Final   Parainfluenza Virus 3 NOT DETECTED NOT DETECTED Final   Parainfluenza Virus 4 NOT DETECTED NOT DETECTED Final   Respiratory Syncytial Virus NOT DETECTED NOT DETECTED Final   Bordetella pertussis NOT DETECTED NOT DETECTED Final   Chlamydophila pneumoniae NOT DETECTED NOT DETECTED Final   Mycoplasma pneumoniae NOT  DETECTED NOT DETECTED Final    Comment: Performed at [facility], [address], [place], [place] [address]  MRSA PCR Screening     Status: None   Collection Time: 10/26/16  8:32 PM  Result Value Ref Range Status   MRSA by PCR NEGATIVE NEGATIVE Final    Comment:        The GeneXpert MRSA Assay (FDA approved for NASAL specimens only), is one component of a comprehensive MRSA colonization surveillance program. It is not intended to diagnose MRSA infection nor to guide or monitor treatment for MRSA infections.      Scheduled Meds: . carvedilol  6.25 mg Oral BID WC  . furosemide  60 mg Intravenous Q12H  .  heparin  5,000 Units Subcutaneous Q8H  . hydrALAZINE  10 mg Oral Q8H  . insulin aspart  0-9 Units Subcutaneous TID WC  . isosorbide mononitrate  15 mg Oral Daily     LOS: 5 days   Clint Lipps, MD [facility] Office  [phone] Pager - Text Page per Loretha Stapler as per below:  On-Call/Text Page:      [email or web address]      password [email or web address]  If 7PM-7AM, please contact night-coverage [email or web address] Password [email or web address] 10/31/2016, 10:55 AM

## 2016-11-01 LAB — BASIC METABOLIC PANEL
ANION GAP: 10 (ref 5–15)
BUN: 24 mg/dL — ABNORMAL HIGH (ref 6–20)
CO2: 28 mmol/L (ref 22–32)
Calcium: 7.7 mg/dL — ABNORMAL LOW (ref 8.9–10.3)
Chloride: 100 mmol/L — ABNORMAL LOW (ref 101–111)
Creatinine, Ser: 1.82 mg/dL — ABNORMAL HIGH (ref 0.44–1.00)
GFR calc Af Amer: 41 mL/min — ABNORMAL LOW (ref >60)
GFR, EST NON AFRICAN AMERICAN: 36 mL/min — AB (ref >60)
GLUCOSE: 198 mg/dL — AB (ref 65–99)
POTASSIUM: 4.2 mmol/L (ref 3.5–5.1)
Sodium: 138 mmol/L (ref 135–145)

## 2016-11-01 LAB — GLUCOSE, CAPILLARY
GLUCOSE-CAPILLARY: 156 mg/dL — AB (ref 65–99)
GLUCOSE-CAPILLARY: 173 mg/dL — AB (ref 65–99)
GLUCOSE-CAPILLARY: 216 mg/dL — AB (ref 65–99)
Glucose-Capillary: 181 mg/dL — ABNORMAL HIGH (ref 65–99)

## 2016-11-01 NOTE — Progress Notes (Signed)
PROGRESS NOTE  Lisa Hodges ZOX:[id number] DOB: 07/27/1986 DOA: 10/26/2016 PCP: No PCP Per Patient   LOS: 6 days   Brief Narrative: 31 y.o.F w/ hx of hypertension, diabetes mellitus, and missed abortion in 2017 who presented with complaints of shortness of breath and worsening lower extremity edema over 4 weeks.  Her sx did not improve after being tx for PNA as an outpt.  Labs in the ED noted a troponin of 0.06, BNP of 867, chest x-ray significant for worsening multifocal infiltrates and small pleural effusion.  Assessment & Plan: Active Problems:   Uncontrolled type 2 diabetes mellitus with hyperglycemia, without long-term current use of insulin (HCC)   Acute respiratory failure (HCC)   Acute systolic congestive heart failure (HCC)   AKI (acute kidney injury) (HCC)   Diabetes (HCC)   Hypertension   Cardiomyopathy-etiology not determined but suspect NICM   Chronic diastolic CHF (congestive heart failure) (HCC)   Newly diagnosed acute systolic CHF - TTE notes severe global reduction in LV systolic fxn w/ EF 30-35%, etiology unclear at present  - Per cardiology notes, no plan for ischemic work up to this admission, consider cath versus Lexiscan as outpatient. - Cardiology following, continue diuresis, compression stockings - Currently on Coreg, Imdur and hydralazine. - On IV Lasix - On admission, weight was 216 >> 204  Acute kidney injury - The most recent creatinine baseline is from 10/26/2015 which was 1.1. - Presented with creatinine of 1.82, BUN: 24.  - Patient with proteinuria last year in the nephrotic range, she still has proteinuria about 1 g on the most recent UA this hospitalization, she is hypoalbuminemic at 1.3. This is likely in the setting of poorly controlled diabetes, however will check an ANA to rule out autoimmune causes. Will need follow-up with nephrology as an outpatient  Acute hypoxic respiratory failure due to Pulmonary Edema  - Initially required up to 6 L of  oxygen, wean oxygen as tolerated.  Rhinovirus URI - Confirmed on viral resp panel 10/26/16.   HTN - Blood pressure well controlled throughout admission - Patient takes Coreg, Imdur, hydralazine and lasix.  - Continue to monitor.  DM type II uncontrolled - CBGs reasonably controlled in the hospital, on carbohydrate modified diet and SSI. - A1c is 10.1, correlate with mean plasma glucose of 243, indicates poor glycemic control as outpatient.  Morbid Obesity  - Body mass index is 44.15 kg/m   DVT prophylaxis: Heparin subcutaneous. Code Status: Full. Family Communication: None at bedside Disposition Plan: TBD  Consultants:  Cardiology   Procedures:   None at this time.  Antimicrobials:  None.  Subjective: - no chest pain, shortness of breath, no abdominal pain, nausea or vomiting. Wants to go home  Objective: Vitals:   10/31/16 1341 10/31/16 2116 11/01/16 0627 11/01/16 0854  BP: 122/72 107/64 123/70 124/80  Pulse: 97 91 100 100  Resp:  20 20   Temp:  97.5 F (36.4 C) 98 F (36.7 C)   TempSrc:  Oral Oral   SpO2:  100% 100%   Weight:   92.6 kg (204 lb 1.6 oz)   Height:        Intake/Output Summary (Last 24 hours) at 11/01/16 1034 Last data filed at 11/01/16 0925  Gross per 24 hour  Intake             1200 ml  Output              850 ml  Net  350 ml   Filed Weights   10/30/16 0615 10/31/16 0618 11/01/16 0627  Weight: 94.1 kg (207 lb 6.4 oz) 92.5 kg (204 lb) 92.6 kg (204 lb 1.6 oz)    Examination: Constitutional: NAD Vitals:   10/31/16 1341 10/31/16 2116 11/01/16 0627 11/01/16 0854  BP: 122/72 107/64 123/70 124/80  Pulse: 97 91 100 100  Resp:  20 20   Temp:  97.5 F (36.4 C) 98 F (36.7 C)   TempSrc:  Oral Oral   SpO2:  100% 100%   Weight:   92.6 kg (204 lb 1.6 oz)   Height:       ENMT: Mucous membranes are moist.  Neck: normal, supple. Respiratory: clear to auscultation bilaterally, no wheezing, no crackles. Normal respiratory  effort. No accessory muscle use.  Cardiovascular: Regular rate and rhythm, no murmurs / rubs / gallops. 2+ LE edema. Abdomen: no tenderness. Bowel sounds positive.  Neurologic: Nonfocal. Psychiatric: Normal judgment and insight. Alert and oriented x 3. Normal mood.    Data Reviewed: I have personally reviewed following labs and imaging studies  CBC:  Recent Labs Lab 10/26/16 0249 10/26/16 0307 10/26/16 2055 10/28/16 0242 10/30/16 0337  WBC 6.9  --  5.8 4.9 5.1  NEUTROABS 5.9  --   --   --   --   HGB 10.5* 11.9* 10.1* 9.9* 8.5*  HCT 34.0* 35.0* 33.1* 32.0* 28.2*  MCV 61.7*  --  62.7* 62.3* 62.7*  PLT 493*  --  499* 462* 411*   Basic Metabolic Panel:  Recent Labs Lab 10/29/16 0449 10/30/16 0337 10/31/16 1011 10/31/16 1051 11/01/16 0505  NA 138 138 138 137 138  K 3.9 4.2 4.2 4.2 4.2  CL 104 106 101 99* 100*  CO2 25 27 26 27 28   GLUCOSE 176* 196* 164* 151* 198*  BUN 23* 23* 24* 24* 24*  CREATININE 1.74* 1.97* 1.83* 1.84* 1.82*  CALCIUM 7.8* 7.5* 7.7* 7.9* 7.7*   GFR: Estimated Creatinine Clearance: 42.2 mL/min (by C-G formula based on SCr of 1.82 mg/dL (H)). Liver Function Tests:  Recent Labs Lab 10/28/16 0242  AST 35  ALT 29  ALKPHOS 66  BILITOT 0.6  PROT 5.0*  ALBUMIN 1.3*   Cardiac Enzymes:  Recent Labs Lab 10/26/16 0819 10/26/16 1840 10/28/16 0242  TROPONINI 0.06* 0.06* 0.04*   BNP (last 3 results) No results for input(s): PROBNP in the last 8760 hours. HbA1C:  Recent Labs  10/30/16 0337  HGBA1C 10.1*   CBG:  Recent Labs Lab 10/31/16 0747 10/31/16 1116 10/31/16 1627 10/31/16 2230 11/01/16 0736  GLUCAP 152* 160* 173* 186* 181*   Urine analysis:    Component Value Date/Time   COLORURINE STRAW (A) 10/28/2016 1806   APPEARANCEUR CLEAR 10/28/2016 1806   LABSPEC 1.006 10/28/2016 1806   PHURINE 6.0 10/28/2016 1806   GLUCOSEU 50 (A) 10/28/2016 1806   HGBUR SMALL (A) 10/28/2016 1806   BILIRUBINUR NEGATIVE 10/28/2016 1806    KETONESUR NEGATIVE 10/28/2016 1806   PROTEINUR 100 (A) 10/28/2016 1806   UROBILINOGEN 0.2 09/01/2014 2124   NITRITE NEGATIVE 10/28/2016 1806   LEUKOCYTESUR NEGATIVE 10/28/2016 1806   Sepsis Labs: Invalid input(s): PROCALCITONIN, LACTICIDVEN  Recent Results (from the past 240 hour(s))  Respiratory Panel by PCR     Status: Abnormal   Collection Time: 10/26/16  4:40 PM  Result Value Ref Range Status   Adenovirus NOT DETECTED NOT DETECTED Final   Coronavirus 229E NOT DETECTED NOT DETECTED Final   Coronavirus HKU1 NOT DETECTED NOT DETECTED  Final   Coronavirus NL63 NOT DETECTED NOT DETECTED Final   Coronavirus OC43 NOT DETECTED NOT DETECTED Final   Metapneumovirus NOT DETECTED NOT DETECTED Final   Rhinovirus / Enterovirus DETECTED (A) NOT DETECTED Final   Influenza A NOT DETECTED NOT DETECTED Final   Influenza B NOT DETECTED NOT DETECTED Final   Parainfluenza Virus 1 NOT DETECTED NOT DETECTED Final   Parainfluenza Virus 2 NOT DETECTED NOT DETECTED Final   Parainfluenza Virus 3 NOT DETECTED NOT DETECTED Final   Parainfluenza Virus 4 NOT DETECTED NOT DETECTED Final   Respiratory Syncytial Virus NOT DETECTED NOT DETECTED Final   Bordetella pertussis NOT DETECTED NOT DETECTED Final   Chlamydophila pneumoniae NOT DETECTED NOT DETECTED Final   Mycoplasma pneumoniae NOT DETECTED NOT DETECTED Final    Comment: Performed at [facility], [address], [place], [place] [address]  MRSA PCR Screening     Status: None   Collection Time: 10/26/16  8:32 PM  Result Value Ref Range Status   MRSA by PCR NEGATIVE NEGATIVE Final    Comment:        The GeneXpert MRSA Assay (FDA approved for NASAL specimens only), is one component of a comprehensive MRSA colonization surveillance program. It is not intended to diagnose MRSA infection nor to guide or monitor treatment for MRSA infections.     Radiology Studies: No results found.  Scheduled Meds: . carvedilol  6.25 mg Oral BID WC  .  furosemide  60 mg Intravenous Q12H  . heparin  5,000 Units Subcutaneous Q8H  . hydrALAZINE  10 mg Oral Q8H  . insulin aspart  0-9 Units Subcutaneous TID WC  . isosorbide mononitrate  15 mg Oral Daily   Continuous Infusions:  Costin M. Elvera Lennox, MD [facility] [phone]  If 7PM-7AM, please contact night-coverage [email or web address] Password [facility] 11/01/2016, 10:34 AM

## 2016-11-01 NOTE — Progress Notes (Signed)
Received consult for PCP/ paying for medication.  Patient was seen by Shauna Hugh RN CM on 10/26/2016-CM spoke with pt who confirms uninsured [employer] resident with no pcp.  CM discussed and provided written information to assist pt with determining choice for uninsured accepting pcps, discussed the importance of pcp vs EDP services for f/u care, www.needymeds.org, www.goodrx.com, discounted pharmacies and other [employer] such as [facility] , [employer], affordable care act, financial assistance, uninsured dental services, [place] med assist, DSS and  health department  Reviewed resources for [employer] uninsured accepting pcps like [facility], family medicine at [facility], [facility], [facility], local urgent care centers, [facility], [facility] family practice, general medical clinics, family services of the [place], [facility] urgent care plus others, medication resources, [facility] out patient pharmacies and housing Pt voiced understanding and appreciation of resources provided   Provided [facility] contact information   Entered in d/c instructions Please use the resources provided to you in emergency room by case manager to assist in your choice of doctor for follow up  These [employer] uninsured resources provide possible primary care providers, resources for discounted medications, housing, dental resources, affordable care act information, plus other resources for [employer]; Shauna Hugh RN Case Management

## 2016-11-01 NOTE — Progress Notes (Signed)
Progress Note  Patient Name: Lisa Hodges Date of Encounter: 11/01/2016  Primary Cardiologist: Dr. Daiva Nakayama   Subjective   No chest pain and improved SOB and edema.  Inpatient Medications    Scheduled Meds: . carvedilol  6.25 mg Oral BID WC  . furosemide  60 mg Intravenous Q12H  . heparin  5,000 Units Subcutaneous Q8H  . hydrALAZINE  10 mg Oral Q8H  . insulin aspart  0-9 Units Subcutaneous TID WC  . isosorbide mononitrate  15 mg Oral Daily   Continuous Infusions:  PRN Meds: acetaminophen, albuterol, alum & mag hydroxide-simeth, guaiFENesin-dextromethorphan, hydrALAZINE, ondansetron (ZOFRAN) IV   Vital Signs    Vitals:   10/31/16 1341 10/31/16 2116 11/01/16 0627 11/01/16 0854  BP: 122/72 107/64 123/70 124/80  Pulse: 97 91 100 100  Resp:  20 20   Temp:  97.5 F (36.4 C) 98 F (36.7 C)   TempSrc:  Oral Oral   SpO2:  100% 100%   Weight:   204 lb 1.6 oz (92.6 kg)   Height:        Intake/Output Summary (Last 24 hours) at 11/01/16 1014 Last data filed at 11/01/16 0925  Gross per 24 hour  Intake             1200 ml  Output              850 ml  Net              350 ml   Filed Weights   10/30/16 0615 10/31/16 0618 11/01/16 0627  Weight: 207 lb 6.4 oz (94.1 kg) 204 lb (92.5 kg) 204 lb 1.6 oz (92.6 kg)    Telemetry    SR to ST rate 90's-low 100's - Personally Reviewed  ECG    No new since 10/29/16 - Personally Reviewed  Physical Exam   GEN: No acute distress.  Lying in bed almost flat without respiratory difficulty Neck: No JVD Cardiac: RRR, no murmurs, rubs, or gallops.  Respiratory: Clear to auscultation bilaterally. Cough present GI: obese,Soft, nontender, non-distended  MS:  1+ Lower ext edema but improved; No deformity. Compression stockings in place Neuro:  Nonfocal  Psych: Normal affect   Labs    Chemistry Recent Labs Lab 10/28/16 0242  10/31/16 1011 10/31/16 1051 11/01/16 0505  NA 140  < > 138 137 138  K 4.3  < > 4.2 4.2 4.2  CL 107   < > 101 99* 100*  CO2 25  < > 26 27 28   GLUCOSE 151*  < > 164* 151* 198*  BUN 20  < > 24* 24* 24*  CREATININE 1.65*  < > 1.83* 1.84* 1.82*  CALCIUM 7.9*  < > 7.7* 7.9* 7.7*  PROT 5.0*  --   --   --   --   ALBUMIN 1.3*  --   --   --   --   AST 35  --   --   --   --   ALT 29  --   --   --   --   ALKPHOS 66  --   --   --   --   BILITOT 0.6  --   --   --   --   GFRNONAA 40*  < > 36* 35* 36*  GFRAA 47*  < > 41* 41* 41*  ANIONGAP 8  < > 11 11 10   < > = values in this interval not displayed.   Hematology  Recent Labs Lab 10/26/16 2055 10/28/16 0242 10/30/16 0337  WBC 5.8 4.9 5.1  RBC 5.28* 5.14* 4.50  HGB 10.1* 9.9* 8.5*  HCT 33.1* 32.0* 28.2*  MCV 62.7* 62.3* 62.7*  MCH 19.1* 19.3* 18.9*  MCHC 30.5 30.9 30.1  RDW 16.0* 16.0* 16.4*  PLT 499* 462* 411*    Cardiac Enzymes  Recent Labs Lab 10/26/16 0819 10/26/16 1840 10/28/16 0242  TROPONINI 0.06* 0.06* 0.04*     Recent Labs Lab 10/26/16 0306  TROPIPOC 0.09*     BNP  Recent Labs Lab 10/26/16 0249 10/28/16 0242  BNP 867.1* 523.8*     DDimer No results for input(s): DDIMER in the last 168 hours.   Radiology    No results found.  Cardiac Studies    Echo 10/26/16- Study Conclusions  - Left ventricle: The cavity size was normal. Wall thickness was increased in a pattern of mild LVH. Systolic function was moderately to severely reduced. The estimated ejection fraction was in the range of 30% to 35%. Diffuse hypokinesis. - Aortic valve: There was mild regurgitation. - Mitral valve: There was mild regurgitation. - Pericardium, extracardiac: A small pericardial effusion was identified.  Impressions:  - Moderate to severe global reduction in LV systolic function; mild AI and MR; small to moderate pericardial effusion with RA collapse but no RV diastolic collapse and IVC not dilated.    Patient Profile     31 y.o. female with a history of HTN, DM, and recent CAP-came back tot he ED  10/26/16 with dyspnea. BNP 867, Troponin 0.06. Her CXR suggested worsening pneumonia but CHF was suspected as well. Echo showed cardiomyopathy with an EF of 30% (presumably NICM with global HK).   Assessment & Plan    ACUTE SYSTOLIC HEART FAILURE: Beta blocker and increased diuretic. per Dr. Antoine Poche " am going to hold off onstarting ARB/ACE. She is down 6.7 liters since admission. I do not plan an ischemia work up this admission but will consider cath vs [facility] as an out patient." low dose imdur and hydralazine added.  On BB coreg at 6.25 BID.  BP improved now 120s systolic more consistently  -wt is down from 216 lbs to 204, stable. 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 Creat elevated to 1.97 and is now stable at 1.8 for 3 days.  She still has volume overload but improving. She is  keeping her feet elevated, continue to diuresis, Dr. Elease Hashimoto to see.   And compression stockings were added. Will continue IV lasix until creatinine improved.   HTN: BP is controlled.   Anemia  Hgb 10.5 down to hgb 8.5   diabetes per IM  Pt has daughter with cerebral palsy at home 82 years old, she has physical work to do to care for her.  She does have help.   SignedBerton Bon, NP  11/01/2016, 10:14 AM   Pager: [phone]  Attending Note:   The patient was seen and examined.  Agree with assessment and plan as noted above.  Changes made to the above note as needed.  Patient seen and independently examined with Lizabeth Leyden, NP.   We discussed all aspects of the encounter. I agree with the assessment and plan as stated above.  1. CHF :  Continue IV diuresis.  2. CKD :   Dr. Elvera Lennox has brought up the question of her CKD.    He will be evaluating this further    I  have spent a total of 40 minutes with patient reviewing hospital  notes , telemetry, EKGs, labs and examining patient as well as establishing an assessment and plan that was discussed with the patient. > 50% of time was spent in direct patient care.    Vesta Mixer, Montez Hageman., MD, [facility] 11/01/2016, 12:58 PM [address]  [address] Office [phone] Pager [phone]

## 2016-11-02 LAB — GLUCOSE, CAPILLARY
GLUCOSE-CAPILLARY: 217 mg/dL — AB (ref 65–99)
Glucose-Capillary: 178 mg/dL — ABNORMAL HIGH (ref 65–99)
Glucose-Capillary: 166 mg/dL — ABNORMAL HIGH (ref 65–99)
Glucose-Capillary: 186 mg/dL — ABNORMAL HIGH (ref 65–99)

## 2016-11-02 LAB — BASIC METABOLIC PANEL
Anion gap: 10 (ref 5–15)
BUN: 24 mg/dL — AB (ref 6–20)
CALCIUM: 7.9 mg/dL — AB (ref 8.9–10.3)
CHLORIDE: 98 mmol/L — AB (ref 101–111)
CO2: 29 mmol/L (ref 22–32)
CREATININE: 1.75 mg/dL — AB (ref 0.44–1.00)
GFR calc non Af Amer: 37 mL/min — ABNORMAL LOW (ref >60)
GFR, EST AFRICAN AMERICAN: 43 mL/min — AB (ref >60)
Glucose, Bld: 182 mg/dL — ABNORMAL HIGH (ref 65–99)
Potassium: 4 mmol/L (ref 3.5–5.1)
SODIUM: 137 mmol/L (ref 135–145)

## 2016-11-02 LAB — ANTINUCLEAR ANTIBODIES, IFA: ANA Ab, IFA: NEGATIVE

## 2016-11-02 NOTE — Progress Notes (Signed)
PROGRESS NOTE  Lisa Hodges ZOX:[id number] DOB: [birth year] DOA: 10/26/2016 PCP: No PCP Per Patient   LOS: 7 days   Brief Narrative: 31 y.o.F w/ hx of hypertension, diabetes mellitus, and missed abortion in 2017 who presented with complaints of shortness of breath and worsening lower extremity edema over 4 weeks.  Her sx did not improve after being tx for PNA as an outpt.  Labs in the ED noted a troponin of 0.06, BNP of 867, chest x-ray significant for worsening multifocal infiltrates and small pleural effusion.  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 - On IV Lasix, continue - On admission, weight was 216 >> 200 today   Acute kidney injury - The most recent creatinine baseline is from 10/26/2015 which was 1.1. - Presented with creatinine of 1.82, BUN: 24.  - Patient with proteinuria last year in the nephrotic range, she still has proteinuria about 1 g on the most recent UA this hospitalization, she is hypoalbuminemic at 1.3. This is likely in the setting of poorly controlled diabetes, however will check an ANA to rule out autoimmune causes. Will need follow-up with nephrology as an outpatient - Cr stable  Acute hypoxic respiratory failure due to Pulmonary Edema  -  Initially required up to 6 L of oxygen, wean oxygen as tolerated, on room air this morning  Rhinovirus URI - Confirmed on viral resp panel 10/26/16 - impoving  HTN - Blood pressure well controlled throughout admission - Patient takes Coreg, Imdur, hydralazine and lasix.  - Continue to monitor, BP normal this morning  DM type II uncontrolled - CBGs reasonably controlled in the hospital, on carbohydrate modified diet and SSI. - A1c is 10.1, correlate with mean plasma glucose of 243, indicates poor glycemic control as outpatient. - will need insulin on d/c  Morbid Obesity  - Body mass index is 44.15 kg/m   DVT prophylaxis: Heparin subcutaneous. Code Status: Full. Family Communication: None at bedside Disposition Plan: TBD  Consultants:  Cardiology   Procedures:   None at this time.  Antimicrobials:  None.  Subjective: - no chest pain, shortness of breath, no abdominal pain, nausea or vomiting. Wants to go home  Objective: Vitals:   11/02/16 0541 11/02/16 0629 11/02/16 0735 11/02/16 1139  BP: 136/87 (!) 143/84 (!) 138/97 117/69  Pulse:  96 96 95  Resp:  18  18  Temp:  98 F (36.7 C)  97.8 F (36.6 C)  TempSrc:  Oral  Oral  SpO2:  94% 95% 98%  Weight:  90.9 kg (200 lb 4.8 oz)    Height:        Intake/Output Summary (Last 24 hours) at 11/02/16 1307 Last data filed at 11/02/16 0925  Gross per 24 hour  Intake              960  ml  Output             2150 ml  Net            -1190 ml   Filed Weights   10/31/16 0618 11/01/16 0627 11/02/16 0629  Weight: 92.5 kg (204 lb) 92.6 kg (204 lb 1.6 oz) 90.9 kg (200 lb 4.8 oz)    Examination: Constitutional: NAD Vitals:   11/02/16 0541 11/02/16 0629 11/02/16 0735 11/02/16 1139  BP: 136/87 (!) 143/84 (!) 138/97 117/69  Pulse:  96 96 95  Resp:  18  18  Temp:  98 F (36.7 C)  97.8 F (36.6 C)  TempSrc:  Oral  Oral  SpO2:  94% 95% 98%  Weight:  90.9 kg (200 lb 4.8 oz)    Height:       ENMT: Mucous membranes are  moist.  Neck: normal, supple. Respiratory: clear to auscultation bilaterally, no wheezing, no crackles. Normal respiratory effort. No accessory muscle use.  Cardiovascular: Regular rate and rhythm, no murmurs / rubs / gallops. 2+ LE edema. Abdomen: no tenderness. Bowel sounds positive.  Neurologic: Nonfocal. Psychiatric: Normal judgment and insight. Alert and oriented x 3. Normal mood.    Data Reviewed: I have personally reviewed following labs and imaging studies  CBC:  Recent Labs Lab 10/26/16 2055 10/28/16 0242 10/30/16 0337  WBC 5.8 4.9 5.1  HGB 10.1* 9.9* 8.5*  HCT 33.1* 32.0* 28.2*  MCV 62.7* 62.3* 62.7*  PLT 499* 462* 411*   Basic Metabolic Panel:  Recent Labs Lab 10/30/16 0337 10/31/16 1011 10/31/16 1051 11/01/16 0505 11/02/16 0556  NA 138 138 137 138 137  K 4.2 4.2 4.2 4.2 4.0  CL 106 101 99* 100* 98*  CO2 27 26 27 28 29   GLUCOSE 196* 164* 151* 198* 182*  BUN 23* 24* 24* 24* 24*  CREATININE 1.97* 1.83* 1.84* 1.82* 1.75*  CALCIUM 7.5* 7.7* 7.9* 7.7* 7.9*   GFR: Estimated Creatinine Clearance: 43.4 mL/min (by C-G formula based on SCr of 1.75 mg/dL (H)). Liver Function Tests:  Recent Labs Lab 10/28/16 0242  AST 35  ALT 29  ALKPHOS 66  BILITOT 0.6  PROT 5.0*  ALBUMIN 1.3*   Cardiac Enzymes:  Recent Labs Lab 10/26/16 1840 10/28/16 0242  TROPONINI 0.06* 0.04*   BNP (last 3 results) No results for input(s): PROBNP in the last 8760 hours. HbA1C: No results for input(s): HGBA1C in the last 72 hours. CBG:  Recent Labs Lab 11/01/16 1136 11/01/16 1639 11/01/16 2023 11/02/16 0730 11/02/16 1122  GLUCAP 156* 173* 216* 178* 166*   Urine analysis:    Component Value Date/Time   COLORURINE STRAW (A) 10/28/2016 1806   APPEARANCEUR CLEAR 10/28/2016 1806   LABSPEC 1.006 10/28/2016 1806   PHURINE 6.0 10/28/2016 1806   GLUCOSEU 50 (A) 10/28/2016 1806   HGBUR SMALL (A) 10/28/2016 1806   BILIRUBINUR NEGATIVE 10/28/2016 1806   KETONESUR NEGATIVE  10/28/2016 1806   PROTEINUR 100 (A) 10/28/2016 1806   UROBILINOGEN 0.2 09/01/2014 2124   NITRITE NEGATIVE 10/28/2016 1806   LEUKOCYTESUR NEGATIVE 10/28/2016 1806   Sepsis Labs: Invalid input(s): PROCALCITONIN, LACTICIDVEN  Recent Results (from the past 240 hour(s))  Respiratory Panel by PCR     Status: Abnormal   Collection Time: 10/26/16  4:40 PM  Result Value Ref Range Status   Adenovirus NOT DETECTED NOT DETECTED Final   Coronavirus 229E NOT DETECTED NOT DETECTED Final   Coronavirus HKU1 NOT DETECTED NOT DETECTED Final   Coronavirus NL63 NOT DETECTED  NOT DETECTED Final   Coronavirus OC43 NOT DETECTED NOT DETECTED Final   Metapneumovirus NOT DETECTED NOT DETECTED Final   Rhinovirus / Enterovirus DETECTED (A) NOT DETECTED Final   Influenza A NOT DETECTED NOT DETECTED Final   Influenza B NOT DETECTED NOT DETECTED Final   Parainfluenza Virus 1 NOT DETECTED NOT DETECTED Final   Parainfluenza Virus 2 NOT DETECTED NOT DETECTED Final   Parainfluenza Virus 3 NOT DETECTED NOT DETECTED Final   Parainfluenza Virus 4 NOT DETECTED NOT DETECTED Final   Respiratory Syncytial Virus NOT DETECTED NOT DETECTED Final   Bordetella pertussis NOT DETECTED NOT DETECTED Final   Chlamydophila pneumoniae NOT DETECTED NOT DETECTED Final   Mycoplasma pneumoniae NOT DETECTED NOT DETECTED Final    Comment: Performed at [facility], [address], [place], [place] [address]  MRSA PCR Screening     Status: None   Collection Time: 10/26/16  8:32 PM  Result Value Ref Range Status   MRSA by PCR NEGATIVE NEGATIVE Final    Comment:        The GeneXpert MRSA Assay (FDA approved for NASAL specimens only), is one component of a comprehensive MRSA colonization surveillance program. It is not intended to diagnose MRSA infection nor to guide or monitor treatment for MRSA infections.     Radiology Studies: No results found.  Scheduled Meds: . carvedilol  6.25 mg Oral BID WC  . furosemide  60 mg  Intravenous Q12H  . heparin  5,000 Units Subcutaneous Q8H  . hydrALAZINE  10 mg Oral Q8H  . insulin aspart  0-9 Units Subcutaneous TID WC  . isosorbide mononitrate  15 mg Oral Daily   Continuous Infusions:  Jordy Hewins M. Elvera Lennox, MD [facility] [phone]  If 7PM-7AM, please contact night-coverage [email or web address] Password [email or web address] 11/02/2016, 1:07 PM

## 2016-11-02 NOTE — Progress Notes (Signed)
Patient stated she is motivated to follow her diet, fluid restriction, take medications, get moving, and keep doctor appointments so she can be as healthy as possible.  Stated she has a 33 year old daughter and she needs to do best for her.  Teach back used with Heart Failure education using "Living Better with Heart Failure" booklet and with medications.

## 2016-11-02 NOTE — Progress Notes (Signed)
Progress Note  Patient Name: Lisa Hodges Date of Encounter: 11/02/2016  Primary Cardiologist: Dr. Daiva Nakayama   Subjective   No chest pain and improved SOB and edema.  Inpatient Medications    Scheduled Meds: . carvedilol  6.25 mg Oral BID WC  . furosemide  60 mg Intravenous Q12H  . heparin  5,000 Units Subcutaneous Q8H  . hydrALAZINE  10 mg Oral Q8H  . insulin aspart  0-9 Units Subcutaneous TID WC  . isosorbide mononitrate  15 mg Oral Daily   Continuous Infusions:  PRN Meds: acetaminophen, albuterol, alum & mag hydroxide-simeth, guaiFENesin-dextromethorphan, hydrALAZINE, ondansetron (ZOFRAN) IV   Vital Signs    Vitals:   11/01/16 2200 11/02/16 0541 11/02/16 0629 11/02/16 0735  BP: (!) 114/55 136/87 (!) 143/84 (!) 138/97  Pulse: 70  96 96  Resp: 18  18   Temp: 98.7 F (37.1 C)  98 F (36.7 C)   TempSrc: Oral  Oral   SpO2: 96%  94% 95%  Weight:   200 lb 4.8 oz (90.9 kg)   Height:        Intake/Output Summary (Last 24 hours) at 11/02/16 1116 Last data filed at 11/02/16 0925  Gross per 24 hour  Intake              960 ml  Output             2750 ml  Net            -1790 ml   Filed Weights   10/31/16 0618 11/01/16 0627 11/02/16 0629  Weight: 204 lb (92.5 kg) 204 lb 1.6 oz (92.6 kg) 200 lb 4.8 oz (90.9 kg)    Telemetry    SR to ST rate 90's-low 100's - Personally Reviewed  ECG    No new since 10/29/16 - Personally Reviewed  Physical Exam   GEN: No acute distress.  Lying in bed almost flat without respiratory difficulty Neck: No JVD Cardiac: RRR, no murmurs, rubs, or gallops.  Respiratory: Clear to auscultation bilaterally. Cough present GI: obese,Soft, nontender, non-distended  MS:  1+ Lower ext edema but improved; No deformity. Compression stockings in place Neuro:  Nonfocal  Psych: Normal affect   Labs    Chemistry Recent Labs Lab 10/28/16 0242  10/31/16 1051 11/01/16 0505 11/02/16 0556  NA 140  < > 137 138 137  K 4.3  < > 4.2 4.2 4.0    CL 107  < > 99* 100* 98*  CO2 25  < > 27 28 29   GLUCOSE 151*  < > 151* 198* 182*  BUN 20  < > 24* 24* 24*  CREATININE 1.65*  < > 1.84* 1.82* 1.75*  CALCIUM 7.9*  < > 7.9* 7.7* 7.9*  PROT 5.0*  --   --   --   --   ALBUMIN 1.3*  --   --   --   --   AST 35  --   --   --   --   ALT 29  --   --   --   --   ALKPHOS 66  --   --   --   --   BILITOT 0.6  --   --   --   --   GFRNONAA 40*  < > 35* 36* 37*  GFRAA 47*  < > 41* 41* 43*  ANIONGAP 8  < > 11 10 10   < > = values in this interval not displayed.  Hematology  Recent Labs Lab 10/26/16 2055 10/28/16 0242 10/30/16 0337  WBC 5.8 4.9 5.1  RBC 5.28* 5.14* 4.50  HGB 10.1* 9.9* 8.5*  HCT 33.1* 32.0* 28.2*  MCV 62.7* 62.3* 62.7*  MCH 19.1* 19.3* 18.9*  MCHC 30.5 30.9 30.1  RDW 16.0* 16.0* 16.4*  PLT 499* 462* 411*    Cardiac Enzymes  Recent Labs Lab 10/26/16 1840 10/28/16 0242  TROPONINI 0.06* 0.04*    No results for input(s): TROPIPOC in the last 168 hours.   BNP  Recent Labs Lab 10/28/16 0242  BNP 523.8*     DDimer No results for input(s): DDIMER in the last 168 hours.   Radiology    No results found.  Cardiac Studies    Echo 10/26/16- Study Conclusions  - Left ventricle: The cavity size was normal. Wall thickness was increased in a pattern of mild LVH. Systolic function was moderately to severely reduced. The estimated ejection fraction was in the range of 30% to 35%. Diffuse hypokinesis. - Aortic valve: There was mild regurgitation. - Mitral valve: There was mild regurgitation. - Pericardium, extracardiac: A small pericardial effusion was identified.  Impressions:  - Moderate to severe global reduction in LV systolic function; mild AI and MR; small to moderate pericardial effusion with RA collapse but no RV diastolic collapse and IVC not dilated.    Patient Profile     31 y.o. female with a history of HTN, DM, and recent CAP-came back tot he ED 10/26/16 with dyspnea. BNP 867,  Troponin 0.06. Her CXR suggested worsening pneumonia but CHF was suspected as well. Echo showed cardiomyopathy with an EF of 30% (presumably NICM with global HK).   Assessment & Plan    ACUTE SYSTOLIC HEART FAILURE:   -per Dr. Antoine Poche " am going to hold off on starting ARB/ACE. She is down 6.7 liters since admission. I do not plan an ischemia work up this admission but will consider cath vs [facility] as an out patient." low dose imdur and hydralazine added.  On BB coreg at 6.25 BID.  BP improved now 120s systolic more consistently  -Consider increasing carvedilol as heart rate in the 90's-low 100's. -wt is down from 216 lbs to 200, down 4 pounds from yesterday. I&O negative 220 ml last 24h, negative 8L since admission -on lasix 60 mg IV every 12 hours, Cr slightly better today 1.82-->1.75 -Dietary consulted for low sodium diet- done -on hydralazine 10 mg TID , imdur 15 mg, coreg 6.25 mg BID -Heart failure teaching done- daily weight, low sodium diet, monitoring for edema, medication adherence and follow up. Pt is motivated take care of herself so she can care for her daughter. -Plan for discharge once on oral diuretics  CKD:  -Stage 2-3 baseline.  -Creat elevated to 1.97 and is now 1.75   -She still has volume overload but improving. She is  keeping her feet elevated, continue to diuresis.  And compression stockings were added.  -Will continue IV lasix until creatinine improved.   HTN:  -BP is controlled.   Anemia  -Hgb 10.5 down to hgb 8.5   diabetes  -per IM  Pt has daughter with cerebral palsy at home 93 years old, she has physical work to do to care for her.  She does have help.   Signed, Berton Bon, NP  11/02/2016, 11:16 AM    Attending Note:   The patient was seen and examined.  Agree with assessment and plan as noted above.  Changes made to  the above note as needed.  Patient seen and independently examined with Lizabeth Leyden, NP.   We  discussed all aspects of the encounter. I agree with the assessment and plan as stated above.  1. Acute on chronic diastolic CHF Continue aggressive diuresis. Her creatinine continues to improve with diuresis.  Continue IV lasix for now     I have spent a total of 40 minutes with patient reviewing hospital  notes , telemetry, EKGs, labs and examining patient as well as establishing an assessment and plan that was discussed with the patient. > 50% of time was spent in direct patient care.    Vesta Mixer, Montez Hageman., MD, [facility] 11/02/2016, 12:46 PM [address],  [address] Office [phone] Pager [phone]    Pager: [phone]

## 2016-11-02 NOTE — Progress Notes (Signed)
Physical Therapy Treatment Patient Details Name: Hue Steveson MRN: [id number] DOB: 13-Sep-1986 Today's Date: 11/02/2016    History of Present Illness 31 y.o. female admitted to [facility] on 10/26/16 for SOB.  Newly dx with acute systolic CHF, acute hypoxic respiratory failure due to pulmonary edema, and rhinovirus URI, and acute kidney injury.  Pt with significant PMHx of DM, and HTN.    PT Comments    Pt progressing well toward goals. Pt requires no physical assist for mobility, but continues to have decreased activity tolerance. Pt has improved respiratory function and was able to ambulate on RA with sats remaining above 94%. Pt would benefit from d/c home when medically ready. PT will continue to follow.   Follow Up Recommendations  No PT follow up     Equipment Recommendations  None recommended by PT    Recommendations for Other Services       Precautions / Restrictions Restrictions Weight Bearing Restrictions: No    Mobility  Bed Mobility Overal bed mobility: Independent             General bed mobility comments: pt moved EOB upon SPT arrival without physical assist for verbal cues  Transfers Overall transfer level: Independent Equipment used: None             General transfer comment: pt OOB without physical assist or verbal cues from SPT  Ambulation/Gait Ambulation/Gait assistance: Modified independent (Device/Increase time) Ambulation Distance (Feet): 600 Feet Assistive device: None Gait Pattern/deviations: Step-through pattern   Gait velocity interpretation: at or above normal speed for age/gender General Gait Details: 5 standing breaks; sats above 94% on RA during ambulation; pt anxious with ambulation regarding heart   Stairs            Wheelchair Mobility    Modified Rankin (Stroke Patients Only)       Balance Overall balance assessment: No apparent balance deficits (not formally assessed)                                   Cognition Arousal/Alertness: Awake/alert Behavior During Therapy: WFL for tasks assessed/performed;Anxious Overall Cognitive Status: Within Functional Limits for tasks assessed                      Exercises      General Comments        Pertinent Vitals/Pain Pain Assessment: No/denies pain    Home Living                      Prior Function            PT Goals (current goals can now be found in the care plan section) Additional Goals Additional Goal #1: Pt will ambulate 373ft with mod-I on RA with sats above 90 without exceeding RPE of 13 (6-20 scale).  Progress towards PT goals: Progressing toward goals    Frequency    Min 3X/week      PT Plan Current plan remains appropriate    Co-evaluation             End of Session Equipment Utilized During Treatment: Gait belt Activity Tolerance: Patient tolerated treatment well Patient left: in bed;with call bell/phone within reach;with nursing/sitter in room     Time: 0325-0349 PT Time Calculation (min) (ACUTE ONLY): 24 min  Charges:  $Gait Training: 23-37 mins  G CodesLane Hacker 11/02/2016, 4:30 PM   Lane Hacker, SPT Acute Rehab SPT [phone]

## 2016-11-03 LAB — BASIC METABOLIC PANEL
Anion gap: 10 (ref 5–15)
BUN: 22 mg/dL — AB (ref 6–20)
CO2: 28 mmol/L (ref 22–32)
CREATININE: 1.76 mg/dL — AB (ref 0.44–1.00)
Calcium: 7.9 mg/dL — ABNORMAL LOW (ref 8.9–10.3)
Chloride: 98 mmol/L — ABNORMAL LOW (ref 101–111)
GFR calc Af Amer: 43 mL/min — ABNORMAL LOW (ref >60)
GFR, EST NON AFRICAN AMERICAN: 37 mL/min — AB (ref >60)
GLUCOSE: 246 mg/dL — AB (ref 65–99)
Potassium: 3.9 mmol/L (ref 3.5–5.1)
SODIUM: 136 mmol/L (ref 135–145)

## 2016-11-03 LAB — GLUCOSE, CAPILLARY
GLUCOSE-CAPILLARY: 164 mg/dL — AB (ref 65–99)
Glucose-Capillary: 213 mg/dL — ABNORMAL HIGH (ref 65–99)
Glucose-Capillary: 170 mg/dL — ABNORMAL HIGH (ref 65–99)
Glucose-Capillary: 210 mg/dL — ABNORMAL HIGH (ref 65–99)

## 2016-11-03 LAB — CBC
HCT: 28.8 % — ABNORMAL LOW (ref 36.0–46.0)
Hemoglobin: 8.5 g/dL — ABNORMAL LOW (ref 12.0–15.0)
MCH: 18.4 pg — ABNORMAL LOW (ref 26.0–34.0)
MCHC: 29.5 g/dL — AB (ref 30.0–36.0)
MCV: 62.5 fL — AB (ref 78.0–100.0)
PLATELETS: 408 10*3/uL — AB (ref 150–400)
RBC: 4.61 MIL/uL (ref 3.87–5.11)
RDW: 16.5 % — AB (ref 11.5–15.5)
WBC: 5.9 10*3/uL (ref 4.0–10.5)

## 2016-11-03 MED ORDER — INSULIN STARTER KIT- SYRINGES (ENGLISH)
1.0000 | Freq: Once | Status: AC
  Administered 2016-11-04: 1
  Filled 2016-11-03: qty 1

## 2016-11-03 MED ORDER — INSULIN GLARGINE 100 UNIT/ML ~~LOC~~ SOLN
5.0000 [IU] | Freq: Every day | SUBCUTANEOUS | Status: DC
  Administered 2016-11-03 – 2016-11-08 (×6): 5 [IU] via SUBCUTANEOUS
  Filled 2016-11-03 (×6): qty 0.05

## 2016-11-03 NOTE — Progress Notes (Addendum)
Paged MD and cardiology to inform of pt having chest pain. EKG done, vital signs completed and coreg given, pt laying in bed now and states she is having relief. MD Gherghe returned page, aware of pt condition no new orders  Cardiology returned page, aware of chest pain, EKG, vital signs and pt condition, no new orders   Drezden Seitzinger Elige Radon

## 2016-11-03 NOTE — Progress Notes (Signed)
Progress Note  Patient Name: Lisa Hodges Date of Encounter: 11/03/2016  Primary Cardiologist: Dr. Daiva Nakayama   Subjective   No chest pain and improved SOB and edema. Total I/O is -8.7 liters so far this admission   Inpatient Medications    Scheduled Meds: . carvedilol  6.25 mg Oral BID WC  . furosemide  60 mg Intravenous Q12H  . heparin  5,000 Units Subcutaneous Q8H  . hydrALAZINE  10 mg Oral Q8H  . insulin aspart  0-9 Units Subcutaneous TID WC  . insulin glargine  5 Units Subcutaneous Daily  . isosorbide mononitrate  15 mg Oral Daily   Continuous Infusions:  PRN Meds: acetaminophen, albuterol, alum & mag hydroxide-simeth, guaiFENesin-dextromethorphan, hydrALAZINE, ondansetron (ZOFRAN) IV   Vital Signs    Vitals:   11/02/16 1139 11/02/16 2144 11/03/16 0602 11/03/16 1407  BP: 117/69 115/78 122/72 120/70  Pulse: 95 87 97 90  Resp: 18 18 18 18   Temp: 97.8 F (36.6 C) 97.8 F (36.6 C) 98.2 F (36.8 C)   TempSrc: Oral Oral Oral   SpO2: 98% 96% 97% 97%  Weight:   198 lb 3.2 oz (89.9 kg)   Height:        Intake/Output Summary (Last 24 hours) at 11/03/16 1421 Last data filed at 11/03/16 1417  Gross per 24 hour  Intake              940 ml  Output             1325 ml  Net             -385 ml   Filed Weights   11/01/16 0627 11/02/16 0629 11/03/16 0602  Weight: 204 lb 1.6 oz (92.6 kg) 200 lb 4.8 oz (90.9 kg) 198 lb 3.2 oz (89.9 kg)    Telemetry    SR to ST rate 90's-low 100's - Personally Reviewed  ECG    No new since 10/29/16 - Personally Reviewed  Physical Exam   GEN: No acute distress.  Lying in bed almost flat without respiratory difficulty Neck: No JVD Cardiac: RRR, no murmurs, rubs, or gallops.  Respiratory: Clear to auscultation bilaterally. Cough present GI: obese,Soft, nontender, non-distended  MS:  1+ Lower ext edema but improved; No deformity. Compression stockings in place Neuro:  Nonfocal  Psych: Normal affect   Labs     Chemistry Recent Labs Lab 10/28/16 0242  11/01/16 0505 11/02/16 0556 11/03/16 0353  NA 140  < > 138 137 136  K 4.3  < > 4.2 4.0 3.9  CL 107  < > 100* 98* 98*  CO2 25  < > 28 29 28   GLUCOSE 151*  < > 198* 182* 246*  BUN 20  < > 24* 24* 22*  CREATININE 1.65*  < > 1.82* 1.75* 1.76*  CALCIUM 7.9*  < > 7.7* 7.9* 7.9*  PROT 5.0*  --   --   --   --   ALBUMIN 1.3*  --   --   --   --   AST 35  --   --   --   --   ALT 29  --   --   --   --   ALKPHOS 66  --   --   --   --   BILITOT 0.6  --   --   --   --   GFRNONAA 40*  < > 36* 37* 37*  GFRAA 47*  < > 41* 43* 43*  ANIONGAP 8  < > 10 10 10   < > = values in this interval not displayed.   Hematology  Recent Labs Lab 10/28/16 0242 10/30/16 0337 11/03/16 0353  WBC 4.9 5.1 5.9  RBC 5.14* 4.50 4.61  HGB 9.9* 8.5* 8.5*  HCT 32.0* 28.2* 28.8*  MCV 62.3* 62.7* 62.5*  MCH 19.3* 18.9* 18.4*  MCHC 30.9 30.1 29.5*  RDW 16.0* 16.4* 16.5*  PLT 462* 411* 408*    Cardiac Enzymes  Recent Labs Lab 10/28/16 0242  TROPONINI 0.04*    No results for input(s): TROPIPOC in the last 168 hours.   BNP  Recent Labs Lab 10/28/16 0242  BNP 523.8*     DDimer No results for input(s): DDIMER in the last 168 hours.   Radiology    No results found.  Cardiac Studies    Echo 10/26/16- Study Conclusions  - Left ventricle: The cavity size was normal. Wall thickness was increased in a pattern of mild LVH. Systolic function was moderately to severely reduced. The estimated ejection fraction was in the range of 30% to 35%. Diffuse hypokinesis. - Aortic valve: There was mild regurgitation. - Mitral valve: There was mild regurgitation. - Pericardium, extracardiac: A small pericardial effusion was identified.  Impressions:  - Moderate to severe global reduction in LV systolic function; mild AI and MR; small to moderate pericardial effusion with RA collapse but no RV diastolic collapse and IVC not dilated.    Patient  Profile     31 y.o. female with a history of HTN, DM, and recent CAP-came back tot he ED 10/26/16 with dyspnea. BNP 867, Troponin 0.06. Her CXR suggested worsening pneumonia but CHF was suspected as well. Echo showed cardiomyopathy with an EF of 30% (presumably NICM with global HK).   Assessment & Plan    ACUTE SYSTOLIC HEART FAILURE:   -per Dr. Antoine Poche " am going to hold off on starting ARB/ACE. She is down 8.7  liters since admission.   she is feeling much better  Renal function remaining stable  Possibly home over the weekend - after we see that she will continue to diurese with PO diuretics ( either lasix or Demadex )   CKD:  -Stage 2-3 baseline.  -Creat elevated to 1.97 and is now 1.75   -She still has volume overload but improving. She is  keeping her feet elevated, continue to diuresis.  And compression stockings were added.  -Will continue IV lasix until creatinine improved.   HTN:  -BP is controlled.      [name], MD  11/03/2016 2:28 PM    [facility] [address] [place],  [address] [place], [place]  [address] Pager [phone] Phone: [phone]; Fax: [phone]

## 2016-11-03 NOTE — Progress Notes (Signed)
Patient qualifies for the [facility] (Medication Assistance Through [facility]); letter placed on the hard chart; At discharge please give letter to the patient so she can get her medication. Abelino Derrick [facility] [phone]

## 2016-11-03 NOTE — Progress Notes (Signed)
Inpatient Diabetes Program Recommendations  AACE/ADA: New Consensus Statement on Inpatient Glycemic Control (2015)  Target Ranges:  Prepandial:   less than 140 mg/dL      Peak postprandial:   less than 180 mg/dL (1-2 hours)      Critically ill patients:  140 - 180 mg/dL   Lab Results  Component Value Date   GLUCAP 210 (H) 11/03/2016   HGBA1C 10.1 (H) 10/30/2016    Review of Glycemic Control  Pt will be discharging on insulin. Ordered insulin starter kit and diabetes videos on pt ed channel. Spoke with RN regarding insulin administration teaching and allowing pt to give her own injections. Will need affordable insulin at discharge.  Inpatient Diabetes Program Recommendations:    Consider 70/30 10 units bid at discharge.  Has OP Diabetes Education order at discharge. Case manager has given info regarding obtaining PCP to manage DM.  Thank you. Lorenda Peck, RD, LDN, CDE Inpatient Diabetes Coordinator [phone]

## 2016-11-03 NOTE — Progress Notes (Addendum)
Tried to ordered diabetes videos [phone] for pt but she wanted to sleep. Left yellow instruction guide to order videos at bedside. Instructed patient on how to use insulin via teach back and demonstration. Pt injected herself succesfully. Ordered insulin starter kit- syringes   Lisa Hodges

## 2016-11-03 NOTE — Progress Notes (Signed)
Inpatient Diabetes Program Recommendations  AACE/ADA: New Consensus Statement on Inpatient Glycemic Control (2015)  Target Ranges:  Prepandial:   less than 140 mg/dL      Peak postprandial:   less than 180 mg/dL (1-2 hours)      Critically ill patients:  140 - 180 mg/dL   Lab Results  Component Value Date   GLUCAP 213 (H) 11/03/2016   HGBA1C 10.1 (H) 10/30/2016    Current orders for Inpatient glycemic control:     Sensitive correction scale Novolog 0-9 units [facility]   Inpatient Diabetes Program Recommendations, please consider:     Moderate correction scale Novolog 0-15 units TIDAC and 0-5 units QHS.  Thank you,  Kristine Linea, RN, MSN Diabetes Coordinator Inpatient Diabetes Program [phone] (Team Pager)

## 2016-11-03 NOTE — Progress Notes (Signed)
PROGRESS NOTE  Lisa Hodges ZOX:[id number] DOB: [birth year] DOA: 10/26/2016 PCP: No PCP Per Patient   LOS: 8 days   Brief Narrative: 31 y.o.F w/ hx of hypertension, diabetes mellitus, and missed abortion in 2017 who presented with complaints of shortness of breath and worsening lower extremity edema over 4 weeks.  Her sx did not improve after being tx for PNA as an outpt.  Labs in the ED noted a troponin of 0.06, BNP of 867, chest x-ray significant for worsening multifocal infiltrates and small pleural effusion.  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 - On IV Lasix, continue - On admission, weight was 216 >> 198 today   Acute kidney injury - The most recent creatinine baseline is from 10/26/2015 which was 1.1. - Presented with creatinine of 1.82, BUN: 24.  - Patient with proteinuria last year in the nephrotic range, she still has proteinuria about 1 g on the most recent UA this hospitalization, she is hypoalbuminemic at 1.3. This is likely in the setting of poorly controlled diabetes. ANA negative. Will need follow-up with nephrology as an outpatient - Cr stable, slowly improving  Acute hypoxic respiratory failure due to Pulmonary Edema  - Initially required up to 6 L  of oxygen, wean oxygen as tolerated, on room air this morning  Rhinovirus URI - Confirmed on viral resp panel 10/26/16 - impoving  HTN - Blood pressure well controlled throughout admission - Patient takes Coreg, Imdur, hydralazine and lasix.  - Continue to monitor, BP normal this morning  DM type II uncontrolled - CBGs reasonably controlled in the hospital, on carbohydrate modified diet and SSI. - A1c is 10.1, correlate with mean plasma glucose of 243, indicates poor glycemic control as outpatient. - start Lantus today  Morbid Obesity  - Body mass index is 44.15 kg/m   DVT prophylaxis: Heparin subcutaneous. Code Status: Full. Family Communication: None at bedside Disposition Plan: TBD  Consultants:  Cardiology   Procedures:   None at this time.  Antimicrobials:  None.  Subjective: - no chest pain, shortness of breath, no abdominal pain, nausea or vomiting. Wants to go home  Objective: Vitals:   11/02/16 1139 11/02/16 2144 11/03/16 0602 11/03/16 1407  BP: 117/69 115/78 122/72 120/70  Pulse: 95 87 97 90  Resp: 18 18 18 18   Temp: 97.8 F (36.6 C) 97.8 F (36.6 C) 98.2 F (36.8 C)   TempSrc: Oral Oral Oral   SpO2: 98% 96% 97% 97%  Weight:   89.9 kg (198 lb 3.2 oz)   Height:        Intake/Output Summary (Last 24 hours) at 11/03/16 1435 Last data filed at 11/03/16 1417  Gross per 24 hour  Intake              940 ml  Output  1325 ml  Net             -385 ml   Filed Weights   11/01/16 0627 11/02/16 0629 11/03/16 0602  Weight: 92.6 kg (204 lb 1.6 oz) 90.9 kg (200 lb 4.8 oz) 89.9 kg (198 lb 3.2 oz)    Examination: Constitutional: NAD Vitals:   11/02/16 1139 11/02/16 2144 11/03/16 0602 11/03/16 1407  BP: 117/69 115/78 122/72 120/70  Pulse: 95 87 97 90  Resp: 18 18 18 18   Temp: 97.8 F (36.6 C) 97.8 F (36.6 C) 98.2 F (36.8 C)   TempSrc: Oral Oral Oral   SpO2: 98% 96% 97% 97%  Weight:   89.9 kg (198 lb 3.2 oz)   Height:       ENMT:  Mucous membranes are moist.  Neck: normal, supple. Respiratory: clear to auscultation bilaterally, no wheezing, no crackles. Normal respiratory effort. No accessory muscle use.  Cardiovascular: Regular rate and rhythm, no murmurs / rubs / gallops. 2+ LE edema. Abdomen: no tenderness. Bowel sounds positive.  Neurologic: Nonfocal. Psychiatric: Normal judgment and insight. Alert and oriented x 3. Normal mood.    Data Reviewed: I have personally reviewed following labs and imaging studies  CBC:  Recent Labs Lab 10/28/16 0242 10/30/16 0337 11/03/16 0353  WBC 4.9 5.1 5.9  HGB 9.9* 8.5* 8.5*  HCT 32.0* 28.2* 28.8*  MCV 62.3* 62.7* 62.5*  PLT 462* 411* 408*   Basic Metabolic Panel:  Recent Labs Lab 10/31/16 1011 10/31/16 1051 11/01/16 0505 11/02/16 0556 11/03/16 0353  NA 138 137 138 137 136  K 4.2 4.2 4.2 4.0 3.9  CL 101 99* 100* 98* 98*  CO2 26 27 28 29 28   GLUCOSE 164* 151* 198* 182* 246*  BUN 24* 24* 24* 24* 22*  CREATININE 1.83* 1.84* 1.82* 1.75* 1.76*  CALCIUM 7.7* 7.9* 7.7* 7.9* 7.9*   GFR: Estimated Creatinine Clearance: 42.8 mL/min (by C-G formula based on SCr of 1.76 mg/dL (H)). Liver Function Tests:  Recent Labs Lab 10/28/16 0242  AST 35  ALT 29  ALKPHOS 66  BILITOT 0.6  PROT 5.0*  ALBUMIN 1.3*   Cardiac Enzymes:  Recent Labs Lab 10/28/16 0242  TROPONINI 0.04*   BNP (last 3 results) No results for input(s): PROBNP in the last 8760 hours. HbA1C: No results for input(s): HGBA1C in the last 72 hours. CBG:  Recent Labs Lab 11/02/16 1122 11/02/16 1655 11/02/16 2218 11/03/16 0758 11/03/16 1145  GLUCAP 166* 217* 186* 213* 170*   Urine analysis:    Component Value Date/Time   COLORURINE STRAW (A) 10/28/2016 1806   APPEARANCEUR CLEAR 10/28/2016 1806   LABSPEC 1.006 10/28/2016 1806   PHURINE 6.0 10/28/2016 1806   GLUCOSEU 50 (A) 10/28/2016 1806   HGBUR SMALL (A) 10/28/2016 1806   BILIRUBINUR NEGATIVE 10/28/2016 1806   KETONESUR NEGATIVE  10/28/2016 1806   PROTEINUR 100 (A) 10/28/2016 1806   UROBILINOGEN 0.2 09/01/2014 2124   NITRITE NEGATIVE 10/28/2016 1806   LEUKOCYTESUR NEGATIVE 10/28/2016 1806   Sepsis Labs: Invalid input(s): PROCALCITONIN, LACTICIDVEN  Recent Results (from the past 240 hour(s))  Respiratory Panel by PCR     Status: Abnormal   Collection Time: 10/26/16  4:40 PM  Result Value Ref Range Status   Adenovirus NOT DETECTED NOT DETECTED Final   Coronavirus 229E NOT DETECTED NOT DETECTED Final   Coronavirus HKU1 NOT DETECTED NOT DETECTED Final   Coronavirus NL63 NOT DETECTED NOT DETECTED Final   Coronavirus OC43 NOT DETECTED NOT DETECTED Final  Metapneumovirus NOT DETECTED NOT DETECTED Final   Rhinovirus / Enterovirus DETECTED (A) NOT DETECTED Final   Influenza A NOT DETECTED NOT DETECTED Final   Influenza B NOT DETECTED NOT DETECTED Final   Parainfluenza Virus 1 NOT DETECTED NOT DETECTED Final   Parainfluenza Virus 2 NOT DETECTED NOT DETECTED Final   Parainfluenza Virus 3 NOT DETECTED NOT DETECTED Final   Parainfluenza Virus 4 NOT DETECTED NOT DETECTED Final   Respiratory Syncytial Virus NOT DETECTED NOT DETECTED Final   Bordetella pertussis NOT DETECTED NOT DETECTED Final   Chlamydophila pneumoniae NOT DETECTED NOT DETECTED Final   Mycoplasma pneumoniae NOT DETECTED NOT DETECTED Final    Comment: Performed at [facility], [address], [place], [place] [address]  MRSA PCR Screening     Status: None   Collection Time: 10/26/16  8:32 PM  Result Value Ref Range Status   MRSA by PCR NEGATIVE NEGATIVE Final    Comment:        The GeneXpert MRSA Assay (FDA approved for NASAL specimens only), is one component of a comprehensive MRSA colonization surveillance program. It is not intended to diagnose MRSA infection nor to guide or monitor treatment for MRSA infections.     Radiology Studies: No results found.  Scheduled Meds: . carvedilol  6.25 mg Oral BID WC  . furosemide  60 mg  Intravenous Q12H  . heparin  5,000 Units Subcutaneous Q8H  . hydrALAZINE  10 mg Oral Q8H  . insulin aspart  0-9 Units Subcutaneous TID WC  . insulin glargine  5 Units Subcutaneous Daily  . isosorbide mononitrate  15 mg Oral Daily   Continuous Infusions:  Krystalle Pilkington M. Elvera Lennox, MD [facility] [phone]  If 7PM-7AM, please contact night-coverage [email or web address] Password [email or web address] 11/03/2016, 2:35 PM

## 2016-11-04 LAB — GLUCOSE, CAPILLARY
GLUCOSE-CAPILLARY: 180 mg/dL — AB (ref 65–99)
Glucose-Capillary: 181 mg/dL — ABNORMAL HIGH (ref 65–99)
Glucose-Capillary: 221 mg/dL — ABNORMAL HIGH (ref 65–99)
Glucose-Capillary: 182 mg/dL — ABNORMAL HIGH (ref 65–99)

## 2016-11-04 LAB — BASIC METABOLIC PANEL
Anion gap: 9 (ref 5–15)
BUN: 25 mg/dL — AB (ref 6–20)
CHLORIDE: 100 mmol/L — AB (ref 101–111)
CO2: 26 mmol/L (ref 22–32)
CREATININE: 1.7 mg/dL — AB (ref 0.44–1.00)
Calcium: 8 mg/dL — ABNORMAL LOW (ref 8.9–10.3)
GFR calc Af Amer: 45 mL/min — ABNORMAL LOW (ref >60)
GFR calc non Af Amer: 39 mL/min — ABNORMAL LOW (ref >60)
Glucose, Bld: 190 mg/dL — ABNORMAL HIGH (ref 65–99)
Potassium: 4.3 mmol/L (ref 3.5–5.1)
SODIUM: 135 mmol/L (ref 135–145)

## 2016-11-04 NOTE — Progress Notes (Signed)
Progress Note  Patient Name: Lisa Hodges Date of Encounter: 11/04/2016  Primary Cardiologist: Dr. Vita Barley   Subjective   No chest pain and improved SOB and edema. Total I/O is -9.8 liters so far this admission   Inpatient Medications    Scheduled Meds: . carvedilol  6.25 mg Oral BID WC  . furosemide  60 mg Intravenous Q12H  . heparin  5,000 Units Subcutaneous Q8H  . hydrALAZINE  10 mg Oral Q8H  . insulin aspart  0-9 Units Subcutaneous TID WC  . insulin glargine  5 Units Subcutaneous Daily  . insulin starter kit- syringes  1 kit Other Once  . isosorbide mononitrate  15 mg Oral Daily   Continuous Infusions:  PRN Meds: acetaminophen, albuterol, alum & mag hydroxide-simeth, guaiFENesin-dextromethorphan, hydrALAZINE, ondansetron (ZOFRAN) IV   Vital Signs    Vitals:   11/03/16 1612 11/03/16 2018 11/04/16 0048 11/04/16 0428  BP: 110/88 127/83 137/90 137/89  Pulse: 81 88 93 93  Resp: 16 16 16 18   Temp:  98.2 F (36.8 C) 98 F (36.7 C) 98.6 F (37 C)  TempSrc:  Oral Oral Oral  SpO2: 100% 99% 98% 93%  Weight:    195 lb 6.4 oz (88.6 kg)  Height:        Intake/Output Summary (Last 24 hours) at 11/04/16 0959 Last data filed at 11/04/16 0900  Gross per 24 hour  Intake             1080 ml  Output             1900 ml  Net             -820 ml   Filed Weights   11/02/16 0629 11/03/16 0602 11/04/16 0428  Weight: 200 lb 4.8 oz (90.9 kg) 198 lb 3.2 oz (89.9 kg) 195 lb 6.4 oz (88.6 kg)    Telemetry    SR to ST rate 90's-low 100's - Personally Reviewed  ECG    No new since 10/29/16 - Personally Reviewed  Physical Exam   GEN: No acute distress.  Lying in bed almost flat without respiratory difficulty Neck: No JVD Cardiac: RRR, no murmurs, rubs, or gallops.  Respiratory: Clear to auscultation bilaterally. Cough present GI: obese,Soft, nontender, non-distended  MS:  2+  Lower ext edema but improved; No deformity. Compression stockings in place Neuro:  Nonfocal    Psych: Normal affect   Labs    Chemistry  Recent Labs Lab 11/02/16 0556 11/03/16 0353 11/04/16 0416  NA 137 136 135  K 4.0 3.9 4.3  CL 98* 98* 100*  CO2 29 28 26   GLUCOSE 182* 246* 190*  BUN 24* 22* 25*  CREATININE 1.75* 1.76* 1.70*  CALCIUM 7.9* 7.9* 8.0*  GFRNONAA 37* 37* 39*  GFRAA 43* 43* 45*  ANIONGAP 10 10 9      Hematology  Recent Labs Lab 10/30/16 0337 11/03/16 0353  WBC 5.1 5.9  RBC 4.50 4.61  HGB 8.5* 8.5*  HCT 28.2* 28.8*  MCV 62.7* 62.5*  MCH 18.9* 18.4*  MCHC 30.1 29.5*  RDW 16.4* 16.5*  PLT 411* 408*    Cardiac Enzymes No results for input(s): TROPONINI in the last 168 hours.  No results for input(s): TROPIPOC in the last 168 hours.   BNP No results for input(s): BNP, PROBNP in the last 168 hours.   DDimer No results for input(s): DDIMER in the last 168 hours.   Radiology    No results found.  Cardiac Studies  Echo 10/26/16- Study Conclusions  - Left ventricle: The cavity size was normal. Wall thickness was increased in a pattern of mild LVH. Systolic function was moderately to severely reduced. The estimated ejection fraction was in the range of 30% to 35%. Diffuse hypokinesis. - Aortic valve: There was mild regurgitation. - Mitral valve: There was mild regurgitation. - Pericardium, extracardiac: A small pericardial effusion was identified.  Impressions:  - Moderate to severe global reduction in LV systolic function; mild AI and MR; small to moderate pericardial effusion with RA collapse but no RV diastolic collapse and IVC not dilated.    Patient Profile     31 y.o. female with a history of HTN, DM, and recent CAP-came back tot he ED 10/26/16 with dyspnea. BNP 867, Troponin 0.06. Her CXR suggested worsening pneumonia but CHF was suspected as well. Echo showed cardiomyopathy with an EF of 30% (presumably NICM with global HK).   Assessment & Plan    ACUTE SYSTOLIC HEART FAILURE:    She is down 9.8   liters since admission.   she is feeling much better  Still need additional diuresis   Renal function remaining stable  Possibly home over the weekend - after we see that she will continue to diurese with PO diuretics ( either lasix or Demadex )   CKD:  -Stage 2-3 baseline.  -Creat elevated to 1.97 and is now 1.7 -She still has volume overload but improving. She is  keeping her feet elevated, continue to diuresis.  And compression stockings were added.  -Will continue IV lasix until creatinine improved.   HTN:  -BP is controlled.     Mertie Moores, MD  11/04/2016 9:59 AM    [facility],  [place], [place]  [address] Pager [phone] Phone: [phone]; Fax: [phone]

## 2016-11-04 NOTE — Progress Notes (Signed)
Pt stable 7am-7pm. Pt self administer insulin breakfast, lunch and dinner successfully. Pt ambulated in hallway on room air, tolerated well  Hazelee Harbold Elige Radon

## 2016-11-04 NOTE — Progress Notes (Signed)
Sat at bedside and went over insulin starter kit with patient. Patient administered self insulin. Patient had no questions, stated she had to self administer insulin when she had gestational diabetes. All educational paperwork at bedside.  Lisa Hodges

## 2016-11-04 NOTE — Progress Notes (Signed)
PROGRESS NOTE  Lisa Hodges [id number]:[id number] DOB: 07/12/1986 DOA: 10/26/2016 PCP: No PCP Per Patient   LOS: 9 days   Brief Narrative: 31 y.o.F w/ hx of hypertension, diabetes mellitus, and missed abortion in 2017 who presented with complaints of shortness of breath and worsening lower extremity edema over 4 weeks.  Her sx did not improve after being tx for PNA as an outpt.  Labs in the ED noted a troponin of 0.06, BNP of 867, chest x-ray significant for worsening multifocal infiltrates and small pleural effusion.  Assessment & Plan: Active Problems:   Uncontrolled type 2 diabetes mellitus with hyperglycemia, without long-term current use of insulin (HCC)   Acute respiratory failure (HCC)   Acute systolic congestive heart failure (HCC)   AKI (acute kidney injury) ([place])   Diabetes ([place])   Hypertension   Cardiomyopathy-etiology not determined but suspect NICM   Chronic diastolic CHF (congestive heart failure) ([place])   Newly diagnosed acute systolic CHF - TTE notes severe global reduction in LV systolic fxn w/ EF 02-72%, etiology unclear at present  - Per cardiology notes, no plan for ischemic work up to this admission, consider cath versus Lexiscan as outpatient - Cardiology following, continue diuresis, compression stockings - Currently on Coreg, Imdur and hydralazine. - On IV Lasix, continue. Still edematous - On admission, weight was 216 >> 195 today   Acute kidney injury - The most recent creatinine baseline is from 10/26/2015 which was 1.1. - Presented with creatinine of 1.82, BUN: 24.  - Patient with proteinuria last year in the nephrotic range, she still has proteinuria about 1 g on the most recent UA this hospitalization, she is hypoalbuminemic at 1.3. This is likely in the setting of poorly controlled diabetes. ANA negative. Will need follow-up with nephrology as an outpatient - Cr stable, slowly improving  Acute hypoxic respiratory failure due to Pulmonary Edema  - Initially  required up to 6 L of oxygen, wean oxygen as tolerated, on room air this morning  Rhinovirus URI - Confirmed on viral resp panel 10/26/16 - impoving  HTN - Blood pressure well controlled throughout admission - Patient takes Coreg, Imdur, hydralazine and lasix.  - Continue to monitor, BP normal this morning  DM type II uncontrolled - CBGs reasonably controlled in the hospital, on carbohydrate modified diet and SSI. - A1c is 10.1, correlate with mean plasma glucose of 243, indicates poor glycemic control as outpatient. - continue Lantus - 70/30 on d/c  Morbid Obesity  - Body mass index is 44.15 kg/m   DVT prophylaxis: Heparin subcutaneous. Code Status: Full. Family Communication: None at bedside Disposition Plan: TBD   Consultants:  Cardiology   Procedures:   None at this time.  Antimicrobials:  None.  Subjective: - no chest pain, shortness of breath, no abdominal pain, nausea or vomiting. Wants to go home  Objective: Vitals:   11/03/16 1612 11/03/16 2018 11/04/16 0048 11/04/16 0428  BP: 110/88 127/83 137/90 137/89  Pulse: 81 88 93 93  Resp: 16 16 16 18   Temp:  98.2 F (36.8 C) 98 F (36.7 C) 98.6 F (37 C)  TempSrc:  Oral Oral Oral  SpO2: 100% 99% 98% 93%  Weight:    88.6 kg (195 lb 6.4 oz)  Height:        Intake/Output Summary (Last 24 hours) at 11/04/16 1054 Last data filed at 11/04/16 0900  Gross per 24 hour  Intake             1080 ml  Output             1900 ml  Net             -820 ml   Filed Weights   11/02/16 0629 11/03/16 0602 11/04/16 0428  Weight: 90.9 kg (200 lb 4.8 oz) 89.9 kg (198 lb 3.2 oz) 88.6 kg (195 lb 6.4 oz)    Examination: Constitutional: NAD Vitals:   11/03/16 1612 11/03/16 2018 11/04/16 0048 11/04/16 0428  BP: 110/88 127/83 137/90 137/89  Pulse: 81 88 93 93  Resp: 16 16 16 18   Temp:  98.2 F (36.8 C) 98 F (36.7 C) 98.6 F (37 C)  TempSrc:  Oral Oral Oral  SpO2: 100% 99% 98% 93%  Weight:    88.6 kg (195 lb 6.4 oz)   Height:       ENMT: Mucous membranes are moist.  Neck: normal, supple. Respiratory: clear to auscultation bilaterally, no wheezing, no crackles. Normal respiratory effort. No accessory muscle use.  Cardiovascular: Regular rate and rhythm, no murmurs / rubs / gallops. 2+ LE edema. Abdomen: no tenderness. Bowel sounds positive.  Neurologic: Nonfocal. Psychiatric: Normal judgment and insight. Alert and oriented x 3. Normal mood.    Data Reviewed: I have personally reviewed following labs and imaging studies  CBC:  Recent Labs Lab 10/30/16 0337 11/03/16 0353  WBC 5.1 5.9  HGB 8.5* 8.5*  HCT 28.2* 28.8*  MCV 62.7* 62.5*  PLT 411* 240*   Basic Metabolic Panel:  Recent Labs Lab 10/31/16 1051 11/01/16 0505 11/02/16 0556 11/03/16 0353 11/04/16 0416  NA 137 138 137 136 135  K 4.2 4.2 4.0 3.9 4.3  CL 99* 100* 98* 98* 100*  CO2 27 28 29 28 26   GLUCOSE 151* 198* 182* 246* 190*  BUN 24* 24* 24* 22* 25*  CREATININE 1.84* 1.82* 1.75* 1.76* 1.70*  CALCIUM 7.9* 7.7* 7.9* 7.9* 8.0*   GFR: Estimated Creatinine Clearance: 44 mL/min (by C-G formula based on SCr of 1.7 mg/dL (H)). Liver Function Tests: No results for input(s): AST, ALT, ALKPHOS, BILITOT, PROT, ALBUMIN in the last 168 hours. Cardiac Enzymes: No results for input(s): CKTOTAL, CKMB, CKMBINDEX, TROPONINI in the last 168 hours. BNP (last 3 results) No results for input(s): PROBNP in the last 8760 hours. HbA1C: No results for input(s): HGBA1C in the last 72 hours. CBG:  Recent Labs Lab 11/03/16 0758 11/03/16 1145 11/03/16 1649 11/03/16 2146 11/04/16 0727  GLUCAP 213* 170* 210* 164* 180*   Urine analysis:    Component Value Date/Time   COLORURINE STRAW (A) 10/28/2016 1806   APPEARANCEUR CLEAR 10/28/2016 1806   LABSPEC 1.006 10/28/2016 1806   PHURINE 6.0 10/28/2016 1806   GLUCOSEU 50 (A) 10/28/2016 1806   HGBUR SMALL (A) 10/28/2016 1806   BILIRUBINUR NEGATIVE 10/28/2016 1806   KETONESUR NEGATIVE  10/28/2016 1806   PROTEINUR 100 (A) 10/28/2016 1806   UROBILINOGEN 0.2 09/01/2014 2124   NITRITE NEGATIVE 10/28/2016 1806   LEUKOCYTESUR NEGATIVE 10/28/2016 1806   Sepsis Labs: Invalid input(s): PROCALCITONIN, LACTICIDVEN  Recent Results (from the past 240 hour(s))  Respiratory Panel by PCR     Status: Abnormal   Collection Time: 10/26/16  4:40 PM  Result Value Ref Range Status   Adenovirus NOT DETECTED NOT DETECTED Final   Coronavirus 229E NOT DETECTED NOT DETECTED Final   Coronavirus HKU1 NOT DETECTED NOT DETECTED Final   Coronavirus NL63 NOT DETECTED NOT DETECTED Final   Coronavirus OC43 NOT DETECTED NOT DETECTED Final   Metapneumovirus NOT DETECTED  NOT DETECTED Final   Rhinovirus / Enterovirus DETECTED (A) NOT DETECTED Final   Influenza A NOT DETECTED NOT DETECTED Final   Influenza B NOT DETECTED NOT DETECTED Final   Parainfluenza Virus 1 NOT DETECTED NOT DETECTED Final   Parainfluenza Virus 2 NOT DETECTED NOT DETECTED Final   Parainfluenza Virus 3 NOT DETECTED NOT DETECTED Final   Parainfluenza Virus 4 NOT DETECTED NOT DETECTED Final   Respiratory Syncytial Virus NOT DETECTED NOT DETECTED Final   Bordetella pertussis NOT DETECTED NOT DETECTED Final   Chlamydophila pneumoniae NOT DETECTED NOT DETECTED Final   Mycoplasma pneumoniae NOT DETECTED NOT DETECTED Final    Comment: Performed at [facility], [place] [address] [place], [place] [address]  MRSA PCR Screening     Status: None   Collection Time: 10/26/16  8:32 PM  Result Value Ref Range Status   MRSA by PCR NEGATIVE NEGATIVE Final    Comment:        The GeneXpert MRSA Assay (FDA approved for NASAL specimens only), is one component of a comprehensive MRSA colonization surveillance program. It is not intended to diagnose MRSA infection nor to guide or monitor treatment for MRSA infections.     Radiology Studies: No results found.  Scheduled Meds: . carvedilol  6.25 mg Oral BID WC  . furosemide  60 mg  Intravenous Q12H  . heparin  5,000 Units Subcutaneous Q8H  . hydrALAZINE  10 mg Oral Q8H  . insulin aspart  0-9 Units Subcutaneous TID WC  . insulin glargine  5 Units Subcutaneous Daily  . insulin starter kit- syringes  1 kit Other Once  . isosorbide mononitrate  15 mg Oral Daily   Continuous Infusions:  Costin M. Cruzita Lederer, MD [facility] [phone]  If 7PM-7AM, please contact night-coverage [email or web address] Password [email or web address] 11/04/2016, 10:54 AM

## 2016-11-05 LAB — GLUCOSE, CAPILLARY
GLUCOSE-CAPILLARY: 217 mg/dL — AB (ref 65–99)
GLUCOSE-CAPILLARY: 250 mg/dL — AB (ref 65–99)
Glucose-Capillary: 206 mg/dL — ABNORMAL HIGH (ref 65–99)
Glucose-Capillary: 165 mg/dL — ABNORMAL HIGH (ref 65–99)

## 2016-11-05 LAB — BASIC METABOLIC PANEL
Anion gap: 8 (ref 5–15)
BUN: 26 mg/dL — ABNORMAL HIGH (ref 6–20)
CALCIUM: 8.4 mg/dL — AB (ref 8.9–10.3)
CO2: 27 mmol/L (ref 22–32)
CREATININE: 1.77 mg/dL — AB (ref 0.44–1.00)
Chloride: 100 mmol/L — ABNORMAL LOW (ref 101–111)
GFR calc non Af Amer: 37 mL/min — ABNORMAL LOW (ref >60)
GFR, EST AFRICAN AMERICAN: 43 mL/min — AB (ref >60)
Glucose, Bld: 210 mg/dL — ABNORMAL HIGH (ref 65–99)
Potassium: 4.2 mmol/L (ref 3.5–5.1)
Sodium: 135 mmol/L (ref 135–145)

## 2016-11-05 NOTE — Progress Notes (Signed)
PROGRESS NOTE  Lisa Hodges NID:[id number] DOB: 1985/11/25 DOA: 10/26/2016 PCP: No PCP Per Patient   LOS: 10 days   Brief Narrative: 31 y.o.F w/ hx of hypertension, diabetes mellitus, and missed abortion in 2017 who presented with complaints of shortness of breath and worsening lower extremity edema over 4 weeks.  Her sx did not improve after being tx for PNA as an outpt.  Labs in the ED noted a troponin of 0.06, BNP of 867, chest x-ray significant for worsening multifocal infiltrates and small pleural effusion.  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 - On admission, weight was 216 >> 192 today  - On IV Lasix, continue. Still edematous  Acute kidney injury - The most recent creatinine baseline is from 10/26/2015 which was 1.1. - Presented with creatinine of 1.82, BUN: 24.  - Patient with proteinuria last year in the nephrotic range, she still has proteinuria about 1 g on the most recent UA this hospitalization, she is hypoalbuminemic at 1.3. This is likely in the setting of poorly controlled diabetes. ANA negative. Will need follow-up with nephrology as an outpatient - Cr has stabilized around 1.7  Acute hypoxic respiratory failure due to Pulmonary Edema  - Initially  required up to 6 L of oxygen, wean oxygen as tolerated, on room air now  Rhinovirus URI - Confirmed on viral resp panel 10/26/16 - impoving  HTN - Blood pressure well controlled throughout admission - Patient takes Coreg, Imdur, hydralazine and lasix.  - Continue to monitor, BP normal this morning  DM type II uncontrolled - CBGs reasonably controlled in the hospital, on carbohydrate modified diet and SSI. - A1c is 10.1, correlate with mean plasma glucose of 243, indicates poor glycemic control as outpatient. - continue Lantus - 70/30 on d/c  Morbid Obesity  - Body mass index is 44.15 kg/m   DVT prophylaxis: Heparin subcutaneous. Code Status: Full. Family Communication: None at bedside Disposition Plan: TBD   Consultants:  Cardiology   Procedures:   None  Antimicrobials:  None.  Subjective: - no chest pain, shortness of breath, no abdominal pain, nausea or vomiting. No complaints  Objective: Vitals:   11/04/16 2217 11/05/16 0635 11/05/16 0850 11/05/16 0851  BP: 118/83 114/66 119/88   Pulse: 89 88  97  Resp: 18 18    Temp: 98.6 F (37 C) 98.5 F (36.9 C)    TempSrc: Oral Oral    SpO2: 98% 96%    Weight:  87.5 kg (192 lb 12.8 oz)    Height:        Intake/Output Summary (Last 24 hours) at 11/05/16 1028 Last data filed at 11/05/16 0900  Gross per 24 hour  Intake              920 ml  Output  600 ml  Net              320 ml   Filed Weights   11/03/16 0602 11/04/16 0428 11/05/16 0635  Weight: 89.9 kg (198 lb 3.2 oz) 88.6 kg (195 lb 6.4 oz) 87.5 kg (192 lb 12.8 oz)    Examination: Constitutional: NAD Vitals:   11/04/16 2217 11/05/16 0635 11/05/16 0850 11/05/16 0851  BP: 118/83 114/66 119/88   Pulse: 89 88  97  Resp: 18 18    Temp: 98.6 F (37 C) 98.5 F (36.9 C)    TempSrc: Oral Oral    SpO2: 98% 96%    Weight:  87.5 kg (192 lb 12.8 oz)    Height:       ENMT: Mucous membranes are moist.  Neck: normal, supple. Respiratory: clear to  auscultation bilaterally, no wheezing, no crackles. Normal respiratory effort. No accessory muscle use.  Cardiovascular: Regular rate and rhythm, no murmurs / rubs / gallops. 1-2+ LE edema. Abdomen: no tenderness. Bowel sounds positive.  Neurologic: Nonfocal. Psychiatric: Normal judgment and insight. Alert and oriented x 3. Normal mood.    Data Reviewed: I have personally reviewed following labs and imaging studies  CBC:  Recent Labs Lab 10/30/16 0337 11/03/16 0353  WBC 5.1 5.9  HGB 8.5* 8.5*  HCT 28.2* 28.8*  MCV 62.7* 62.5*  PLT 411* 408*   Basic Metabolic Panel:  Recent Labs Lab 11/01/16 0505 11/02/16 0556 11/03/16 0353 11/04/16 0416 11/05/16 0652  NA 138 137 136 135 135  K 4.2 4.0 3.9 4.3 4.2  CL 100* 98* 98* 100* 100*  CO2 28 29 28 26 27   GLUCOSE 198* 182* 246* 190* 210*  BUN 24* 24* 22* 25* 26*  CREATININE 1.82* 1.75* 1.76* 1.70* 1.77*  CALCIUM 7.7* 7.9* 7.9* 8.0* 8.4*   GFR: Estimated Creatinine Clearance: 41.9 mL/min (by C-G formula based on SCr of 1.77 mg/dL (H)). Liver Function Tests: No results for input(s): AST, ALT, ALKPHOS, BILITOT, PROT, ALBUMIN in the last 168 hours. Cardiac Enzymes: No results for input(s): CKTOTAL, CKMB, CKMBINDEX, TROPONINI in the last 168 hours. BNP (last 3 results) No results for input(s): PROBNP in the last 8760 hours. HbA1C: No results for input(s): HGBA1C in the last 72 hours. CBG:  Recent Labs Lab 11/04/16 0727 11/04/16 1149 11/04/16 1615 11/04/16 2148 11/05/16 0756  GLUCAP 180* 181* 221* 182* 206*   Urine analysis:    Component Value Date/Time   COLORURINE STRAW (A) 10/28/2016 1806   APPEARANCEUR CLEAR 10/28/2016 1806   LABSPEC 1.006 10/28/2016 1806   PHURINE 6.0 10/28/2016 1806   GLUCOSEU 50 (A) 10/28/2016 1806   HGBUR SMALL (A) 10/28/2016 1806   BILIRUBINUR NEGATIVE 10/28/2016 1806   KETONESUR NEGATIVE 10/28/2016 1806   PROTEINUR 100 (A) 10/28/2016 1806   UROBILINOGEN 0.2 09/01/2014 2124   NITRITE  NEGATIVE 10/28/2016 1806   LEUKOCYTESUR NEGATIVE 10/28/2016 1806   Sepsis Labs: Invalid input(s): PROCALCITONIN, LACTICIDVEN  Recent Results (from the past 240 hour(s))  Respiratory Panel by PCR     Status: Abnormal   Collection Time: 10/26/16  4:40 PM  Result Value Ref Range Status   Adenovirus NOT DETECTED NOT DETECTED Final   Coronavirus 229E NOT DETECTED NOT DETECTED Final   Coronavirus HKU1 NOT DETECTED NOT DETECTED Final   Coronavirus NL63 NOT DETECTED NOT DETECTED Final   Coronavirus OC43 NOT DETECTED NOT DETECTED Final   Metapneumovirus NOT DETECTED NOT DETECTED Final   Rhinovirus / Enterovirus DETECTED (A) NOT DETECTED Final  Influenza A NOT DETECTED NOT DETECTED Final   Influenza B NOT DETECTED NOT DETECTED Final   Parainfluenza Virus 1 NOT DETECTED NOT DETECTED Final   Parainfluenza Virus 2 NOT DETECTED NOT DETECTED Final   Parainfluenza Virus 3 NOT DETECTED NOT DETECTED Final   Parainfluenza Virus 4 NOT DETECTED NOT DETECTED Final   Respiratory Syncytial Virus NOT DETECTED NOT DETECTED Final   Bordetella pertussis NOT DETECTED NOT DETECTED Final   Chlamydophila pneumoniae NOT DETECTED NOT DETECTED Final   Mycoplasma pneumoniae NOT DETECTED NOT DETECTED Final    Comment: Performed at [facility], [address], [place], [place] [address]  MRSA PCR Screening     Status: None   Collection Time: 10/26/16  8:32 PM  Result Value Ref Range Status   MRSA by PCR NEGATIVE NEGATIVE Final    Comment:        The GeneXpert MRSA Assay (FDA approved for NASAL specimens only), is one component of a comprehensive MRSA colonization surveillance program. It is not intended to diagnose MRSA infection nor to guide or monitor treatment for MRSA infections.     Radiology Studies: No results found.  Scheduled Meds: . carvedilol  6.25 mg Oral BID WC  . furosemide  60 mg Intravenous Q12H  . heparin  5,000 Units Subcutaneous Q8H  . hydrALAZINE  10 mg Oral Q8H  . insulin  aspart  0-9 Units Subcutaneous TID WC  . insulin glargine  5 Units Subcutaneous Daily  . isosorbide mononitrate  15 mg Oral Daily   Continuous Infusions:  Sarah-Jane Nazario M. Elvera Lennox, MD [facility] [phone]  If 7PM-7AM, please contact night-coverage [email or web address] Password [facility] 11/05/2016, 10:28 AM

## 2016-11-05 NOTE — Progress Notes (Signed)
Progress Note  Patient Name: Lisa Hodges Date of Encounter: 11/05/2016  Primary Cardiologist: Dr. Daiva Nakayama   Subjective   No chest pain and improved SOB and edema. Total I/O is -9.4 liters so far this admission   Inpatient Medications    Scheduled Meds: . carvedilol  6.25 mg Oral BID WC  . furosemide  60 mg Intravenous Q12H  . heparin  5,000 Units Subcutaneous Q8H  . hydrALAZINE  10 mg Oral Q8H  . insulin aspart  0-9 Units Subcutaneous TID WC  . insulin glargine  5 Units Subcutaneous Daily  . isosorbide mononitrate  15 mg Oral Daily   Continuous Infusions:  PRN Meds: acetaminophen, albuterol, alum & mag hydroxide-simeth, guaiFENesin-dextromethorphan, hydrALAZINE, ondansetron (ZOFRAN) IV   Vital Signs    Vitals:   11/05/16 0635 11/05/16 0850 11/05/16 0851 11/05/16 1138  BP: 114/66 119/88  127/86  Pulse: 88  97 91  Resp: 18   16  Temp: 98.5 F (36.9 C)   98.2 F (36.8 C)  TempSrc: Oral   Oral  SpO2: 96%   94%  Weight: 192 lb 12.8 oz (87.5 kg)     Height:        Intake/Output Summary (Last 24 hours) at 11/05/16 1256 Last data filed at 11/05/16 1229  Gross per 24 hour  Intake              800 ml  Output              600 ml  Net              200 ml   Filed Weights   11/03/16 0602 11/04/16 0428 11/05/16 0635  Weight: 198 lb 3.2 oz (89.9 kg) 195 lb 6.4 oz (88.6 kg) 192 lb 12.8 oz (87.5 kg)    Telemetry    SR to ST rate 90's-low 100's - Personally Reviewed  ECG    No new since 10/29/16 - Personally Reviewed  Physical Exam   GEN: No acute distress.  Lying in bed almost flat without respiratory difficulty Neck: No JVD Cardiac: RRR, no murmurs, rubs, or gallops.  Respiratory: Clear to auscultation bilaterally. Cough present GI: obese,Soft, nontender, non-distended  MS:  2+  Lower ext edema but improved; No deformity. Compression stockings in place Neuro:  Nonfocal  Psych: Normal affect   Labs    Chemistry  Recent Labs Lab 11/03/16 0353  11/04/16 0416 11/05/16 0652  NA 136 135 135  K 3.9 4.3 4.2  CL 98* 100* 100*  CO2 28 26 27   GLUCOSE 246* 190* 210*  BUN 22* 25* 26*  CREATININE 1.76* 1.70* 1.77*  CALCIUM 7.9* 8.0* 8.4*  GFRNONAA 37* 39* 37*  GFRAA 43* 45* 43*  ANIONGAP 10 9 8      Hematology  Recent Labs Lab 10/30/16 0337 11/03/16 0353  WBC 5.1 5.9  RBC 4.50 4.61  HGB 8.5* 8.5*  HCT 28.2* 28.8*  MCV 62.7* 62.5*  MCH 18.9* 18.4*  MCHC 30.1 29.5*  RDW 16.4* 16.5*  PLT 411* 408*    Cardiac Enzymes No results for input(s): TROPONINI in the last 168 hours.  No results for input(s): TROPIPOC in the last 168 hours.   BNP No results for input(s): BNP, PROBNP in the last 168 hours.   DDimer No results for input(s): DDIMER in the last 168 hours.   Radiology    No results found.  Cardiac Studies    Echo 10/26/16- Study Conclusions  - Left ventricle: The cavity  size was normal. Wall thickness was increased in a pattern of mild LVH. Systolic function was moderately to severely reduced. The estimated ejection fraction was in the range of 30% to 35%. Diffuse hypokinesis. - Aortic valve: There was mild regurgitation. - Mitral valve: There was mild regurgitation. - Pericardium, extracardiac: A small pericardial effusion was identified.  Impressions:  - Moderate to severe global reduction in LV systolic function; mild AI and MR; small to moderate pericardial effusion with RA collapse but no RV diastolic collapse and IVC not dilated.    Patient Profile     31 y.o. female with a history of HTN, DM, and recent CAP-came back tot he ED 10/26/16 with dyspnea. BNP 867, Troponin 0.06. Her CXR suggested worsening pneumonia but CHF was suspected as well. Echo showed cardiomyopathy with an EF of 30% (presumably NICM with global HK).   Assessment & Plan    ACUTE SYSTOLIC HEART FAILURE:    She is down 9.4  liters since admission.   Appears that she Didn't diurese much overnight but in  talking to her, she did not save all of her urine she is feeling much better  Still need additional diuresis Continue BID lasix for now  Renal function remaining stable  Possibly home over the weekend - after we see that she will continue to diurese with PO diuretics ( either lasix or Demadex )   CKD:  -Stage 2-3 baseline.  -Creat elevated to 1.97 and is now 1.7 -She still has volume overload but improving. She is  keeping her feet elevated, continue to diuresis.  And compression stockings were added.  -Will continue IV lasix until creatinine improved.   HTN:  -BP is controlled.     Lisa Miss, MD  11/05/2016 12:56 PM    [facility] [address] [place],  [address] [place], [place]  [address] Pager [phone] Phone: [phone]; Fax: [phone]

## 2016-11-06 LAB — GLUCOSE, CAPILLARY
GLUCOSE-CAPILLARY: 272 mg/dL — AB (ref 65–99)
Glucose-Capillary: 212 mg/dL — ABNORMAL HIGH (ref 65–99)
Glucose-Capillary: 176 mg/dL — ABNORMAL HIGH (ref 65–99)
Glucose-Capillary: 242 mg/dL — ABNORMAL HIGH (ref 65–99)

## 2016-11-06 LAB — BASIC METABOLIC PANEL
ANION GAP: 11 (ref 5–15)
BUN: 28 mg/dL — ABNORMAL HIGH (ref 6–20)
CO2: 27 mmol/L (ref 22–32)
Calcium: 8.4 mg/dL — ABNORMAL LOW (ref 8.9–10.3)
Chloride: 99 mmol/L — ABNORMAL LOW (ref 101–111)
Creatinine, Ser: 1.91 mg/dL — ABNORMAL HIGH (ref 0.44–1.00)
GFR calc non Af Amer: 34 mL/min — ABNORMAL LOW (ref >60)
GFR, EST AFRICAN AMERICAN: 39 mL/min — AB (ref >60)
GLUCOSE: 243 mg/dL — AB (ref 65–99)
POTASSIUM: 4 mmol/L (ref 3.5–5.1)
Sodium: 137 mmol/L (ref 135–145)

## 2016-11-06 MED ORDER — CARVEDILOL 12.5 MG PO TABS
12.5000 mg | ORAL_TABLET | Freq: Two times a day (BID) | ORAL | Status: DC
  Administered 2016-11-06 – 2016-11-07 (×2): 12.5 mg via ORAL
  Filled 2016-11-06 (×2): qty 1

## 2016-11-06 NOTE — Progress Notes (Signed)
Inpatient Diabetes Program Recommendations  AACE/ADA: New Consensus Statement on Inpatient Glycemic Control (2015)  Target Ranges:  Prepandial:   less than 140 mg/dL      Peak postprandial:   less than 180 mg/dL (1-2 hours)      Critically ill patients:  140 - 180 mg/dL   Lab Results  Component Value Date   GLUCAP 212 (H) 11/06/2016   HGBA1C 10.1 (H) 10/30/2016   Inpatient Diabetes Program Recommendations:   Please consider Novolin 70/30 Mix insulin 5 units BID at discharge, which is available at [facility] for 25$ per vial.  MATCH program provides medications for 1 month for free, and after that patient will not be able to afford insulin.  Spoke with patient and floor RN at bedside regarding self-management of diabetes.  1.  Taught patient the following (teach back and/or return demonstration):  Medications at D/C (what these are, why taking, when taking, how taking, common S.E.'s)  CBG monitoring, A1C  Why exercise is important   Carb modified diet  2.  Identified barriers and facilitators to self-management goals:  Patient taught back and returned demonstration of correct sites, drawing up insulin, insulin injection, and insulin schedule.  Patient given information on Relion products at [facility] and [email or web address] site (for testing supplies).  3.  Identified support systems:  None mentioned  Thank you,  Kristine Linea, RN, MSN Diabetes Coordinator Inpatient Diabetes Program [phone] (Team Pager)

## 2016-11-06 NOTE — Progress Notes (Signed)
PROGRESS NOTE  Lisa Hodges XYO:[id number] DOB: May 13, 1986 DOA: 10/26/2016 PCP: No PCP Per Patient   LOS: 11 days   Brief Narrative: 31 y.o.F w/ hx of hypertension, diabetes mellitus, and missed abortion in 2017 who presented with complaints of shortness of breath and worsening lower extremity edema over 4 weeks.  Her sx did not improve after being tx for PNA as an outpt.  Labs in the ED noted a troponin of 0.06, BNP of 867, chest x-ray significant for worsening multifocal infiltrates and small pleural effusion.  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 - On admission, weight was 216 >> 189 today  - On IV Lasix, continue. Still edematous but improving  Acute kidney injury - The most recent creatinine baseline is from 10/26/2015 which was 1.1. - Presented with creatinine of 1.82, BUN: 24.  - Patient with proteinuria last year in the nephrotic range, she still has proteinuria about 1 g on the most recent UA this hospitalization, she is hypoalbuminemic at 1.3. This is likely in the setting of poorly controlled diabetes. ANA negative. Will need follow-up with nephrology as an outpatient - Cr has stabilized around 1.7  Acute hypoxic respiratory failure due to Pulmonary Edema    - Initially required up to 6 L of oxygen, wean oxygen as tolerated, on room air now  Rhinovirus URI - Confirmed on viral resp panel 10/26/16 - impoving  HTN - Blood pressure well controlled throughout admission - Patient takes Coreg, Imdur, hydralazine and lasix.  - Continue to monitor, BP normal this morning  DM type II uncontrolled - CBGs reasonably controlled in the hospital, on carbohydrate modified diet and SSI. - A1c is 10.1, correlate with mean plasma glucose of 243, indicates poor glycemic control as outpatient. - continue Lantus - 70/30 on d/c  Morbid Obesity  - Body mass index is 44.15 kg/m   DVT prophylaxis: Heparin subcutaneous. Code Status: Full. Family Communication: None at bedside Disposition Plan: TBD   Consultants:  Cardiology   Procedures:   None  Antimicrobials:  None.  Subjective: - no chest pain, shortness of breath, no abdominal pain, nausea or vomiting. No complaints. Feels better and better every day  Objective: Vitals:   11/05/16 1644 11/05/16 2151 11/06/16 0423 11/06/16 0538  BP: 130/81 127/85 127/71 124/73  Pulse: 88 87 96   Resp:  18 17 18   Temp:  98.2 F (36.8 C) 98.4 F (36.9 C)   TempSrc:  Oral Oral   SpO2:  96% 92%   Weight:   86.1 kg (189 lb 14.4 oz)   Height:        Intake/Output Summary (Last 24 hours) at 11/06/16 1012 Last data filed at 11/06/16 1012  Gross per 24 hour  Intake             1080 ml  Output             2625 ml  Net            -1545 ml   Filed Weights   11/04/16 0428 11/05/16 0635 11/06/16 0423  Weight: 88.6 kg (195 lb 6.4 oz) 87.5 kg (192 lb 12.8 oz) 86.1 kg (189 lb 14.4 oz)    Examination: Constitutional: NAD Vitals:   11/05/16 1644 11/05/16 2151 11/06/16 0423 11/06/16 0538  BP: 130/81 127/85 127/71 124/73  Pulse: 88 87 96   Resp:  18 17 18   Temp:  98.2 F (36.8 C) 98.4 F (36.9 C)   TempSrc:  Oral Oral   SpO2:  96% 92%   Weight:   86.1 kg (189 lb 14.4 oz)   Height:       ENMT:  Mucous membranes are moist.  Neck: normal, supple. Respiratory: clear to auscultation bilaterally, no wheezing, no crackles. Normal respiratory effort. No accessory muscle use.  Cardiovascular: Regular rate and rhythm, no murmurs / rubs / gallops. 1-2+ LE edema. Abdomen: no tenderness. Bowel sounds positive.  Neurologic: Nonfocal. Psychiatric: Normal judgment and insight. Alert and oriented x 3. Normal mood.    Data Reviewed: I have personally reviewed following labs and imaging studies  CBC:  Recent Labs Lab 11/03/16 0353  WBC 5.9  HGB 8.5*  HCT 28.8*  MCV 62.5*  PLT 408*   Basic Metabolic Panel:  Recent Labs Lab 11/01/16 0505 11/02/16 0556 11/03/16 0353 11/04/16 0416 11/05/16 0652  NA 138 137 136 135 135  K 4.2 4.0 3.9 4.3 4.2  CL 100* 98* 98* 100* 100*  CO2 28 29 28 26 27   GLUCOSE 198* 182* 246* 190* 210*  BUN 24* 24* 22* 25* 26*  CREATININE 1.82* 1.75* 1.76* 1.70* 1.77*  CALCIUM 7.7* 7.9* 7.9* 8.0* 8.4*   GFR: Estimated Creatinine Clearance: 41.5 mL/min (by C-G formula based on SCr of 1.77 mg/dL (H)). Liver Function Tests: No results for input(s): AST, ALT, ALKPHOS, BILITOT, PROT, ALBUMIN in the last 168 hours. Cardiac Enzymes: No results for input(s): CKTOTAL, CKMB, CKMBINDEX, TROPONINI in the last 168 hours. BNP (last 3 results) No results for input(s): PROBNP in the last 8760 hours. HbA1C: No results for input(s): HGBA1C in the last 72 hours. CBG:  Recent Labs Lab 11/05/16 0756 11/05/16 1134 11/05/16 1621 11/05/16 2149 11/06/16 0740  GLUCAP 206* 217* 165* 250* 212*   Urine analysis:    Component Value Date/Time   COLORURINE STRAW (A) 10/28/2016 1806   APPEARANCEUR CLEAR 10/28/2016 1806   LABSPEC 1.006 10/28/2016 1806   PHURINE 6.0 10/28/2016 1806   GLUCOSEU 50 (A) 10/28/2016 1806   HGBUR SMALL (A) 10/28/2016 1806   BILIRUBINUR NEGATIVE 10/28/2016 1806   KETONESUR NEGATIVE 10/28/2016 1806   PROTEINUR 100 (A) 10/28/2016 1806   UROBILINOGEN  0.2 09/01/2014 2124   NITRITE NEGATIVE 10/28/2016 1806   LEUKOCYTESUR NEGATIVE 10/28/2016 1806   Sepsis Labs: Invalid input(s): PROCALCITONIN, LACTICIDVEN  No results found for this or any previous visit (from the past 240 hour(s)).  Radiology Studies: No results found.  Scheduled Meds: . carvedilol  6.25 mg Oral BID WC  . furosemide  60 mg Intravenous Q12H  . heparin  5,000 Units Subcutaneous Q8H  . hydrALAZINE  10 mg Oral Q8H  . insulin aspart  0-9 Units Subcutaneous TID WC  . insulin glargine  5 Units Subcutaneous Daily  . isosorbide mononitrate  15 mg Oral Daily   Continuous Infusions:  Costin M. Elvera Lennox, MD  [facility] [phone]  If 7PM-7AM, please contact night-coverage [email or web address] Password [email or web address] 11/06/2016, 10:12 AM

## 2016-11-06 NOTE — Progress Notes (Signed)
Physical Therapy Discharge Patient Details Name: Lisa Hodges MRN: [id number] DOB: 11/27/1985 Today's Date: 11/06/2016 Time:  -    Pt has been up walking in the hallway I'ly and walked 600' last PT session on room air with o2 sats 94%.  Pt shown Rate of perceived exertion scale and she reports her exertion at a 6 (very, very light) with gait. No treatment today and encouraged pt to continue to ambulate in the halls.  Patient discharged from PT services secondary to goals met and no further PT needs identified.  Please see latest therapy progress note for current level of functioning and progress toward goals.    Progress and discharge plan discussed with patient and/or caregiver: Patient/Caregiver agrees with plan  GP     [facility] 11/06/2016, 12:12 PM

## 2016-11-06 NOTE — Progress Notes (Signed)
Pt stable 7am-7pm. Pt ambulated hallway once today. Pt worked with diabetes educator and gave insulin to self at breakfast, lunch and dinner successfully   Josedejesus Marcum [employer]

## 2016-11-06 NOTE — Progress Notes (Signed)
Progress Note  Patient Name: Lisa Hodges Date of Encounter: 11/06/2016  Primary Cardiologist: Dr. Daiva Nakayama   Subjective   No chest pain and improved SOB and edema. She is wearing compression stockings. She is saving all urine for better accuracy of I&O. She's been up walking in the halls and feeling much better.  Inpatient Medications    Scheduled Meds: . carvedilol  6.25 mg Oral BID WC  . furosemide  60 mg Intravenous Q12H  . heparin  5,000 Units Subcutaneous Q8H  . hydrALAZINE  10 mg Oral Q8H  . insulin aspart  0-9 Units Subcutaneous TID WC  . insulin glargine  5 Units Subcutaneous Daily  . isosorbide mononitrate  15 mg Oral Daily   Continuous Infusions:  PRN Meds: acetaminophen, albuterol, alum & mag hydroxide-simeth, guaiFENesin-dextromethorphan, hydrALAZINE, ondansetron (ZOFRAN) IV   Vital Signs    Vitals:   11/05/16 1644 11/05/16 2151 11/06/16 0423 11/06/16 0538  BP: 130/81 127/85 127/71 124/73  Pulse: 88 87 96   Resp:  18 17 18   Temp:  98.2 F (36.8 C) 98.4 F (36.9 C)   TempSrc:  Oral Oral   SpO2:  96% 92%   Weight:   189 lb 14.4 oz (86.1 kg)   Height:        Intake/Output Summary (Last 24 hours) at 11/06/16 0809 Last data filed at 11/06/16 0723  Gross per 24 hour  Intake             1040 ml  Output             2625 ml  Net            -1585 ml   Filed Weights   11/04/16 0428 11/05/16 0635 11/06/16 0423  Weight: 195 lb 6.4 oz (88.6 kg) 192 lb 12.8 oz (87.5 kg) 189 lb 14.4 oz (86.1 kg)    Telemetry   SR to ST rate 80's-low 100's with exertion - Personally Reviewed  ECG    No new since 10/29/16 - Personally Reviewed  Physical Exam   GEN: Pleasant, No acute distress.   Neck: No JVD Cardiac: RRR, no murmurs, rubs, or gallops.  Respiratory: Clear to auscultation bilaterally. Cough present GI: obese,Soft, nontender, non-distended  MS:  2+  Lower ext edema but improved; No deformity. Compression stockings in place Neuro:  Nonfocal  Psych:  Normal affect   Labs    Chemistry  Recent Labs Lab 11/03/16 0353 11/04/16 0416 11/05/16 0652  NA 136 135 135  K 3.9 4.3 4.2  CL 98* 100* 100*  CO2 28 26 27   GLUCOSE 246* 190* 210*  BUN 22* 25* 26*  CREATININE 1.76* 1.70* 1.77*  CALCIUM 7.9* 8.0* 8.4*  GFRNONAA 37* 39* 37*  GFRAA 43* 45* 43*  ANIONGAP 10 9 8      Hematology  Recent Labs Lab 11/03/16 0353  WBC 5.9  RBC 4.61  HGB 8.5*  HCT 28.8*  MCV 62.5*  MCH 18.4*  MCHC 29.5*  RDW 16.5*  PLT 408*    Cardiac Enzymes No results for input(s): TROPONINI in the last 168 hours.  No results for input(s): TROPIPOC in the last 168 hours.   BNP No results for input(s): BNP, PROBNP in the last 168 hours.   DDimer No results for input(s): DDIMER in the last 168 hours.   Radiology    No results found.  Cardiac Studies    Echo 10/26/16- Study Conclusions  - Left ventricle: The cavity size was normal. Wall  thickness was increased in a pattern of mild LVH. Systolic function was moderately to severely reduced. The estimated ejection fraction was in the range of 30% to 35%. Diffuse hypokinesis. - Aortic valve: There was mild regurgitation. - Mitral valve: There was mild regurgitation. - Pericardium, extracardiac: A small pericardial effusion was identified.  Impressions:  - Moderate to severe global reduction in LV systolic function; mild AI and MR; small to moderate pericardial effusion with RA collapse but no RV diastolic collapse and IVC not dilated.    Patient Profile     31 y.o. female with a history of HTN, DM, and recent CAP-came back tot he ED 10/26/16 with dyspnea. BNP 867, Troponin 0.06. Her CXR suggested worsening pneumonia but CHF was suspected as well. Echo showed cardiomyopathy with an EF of 30% (presumably NICM with global HK).   Assessment & Plan    ACUTE SYSTOLIC HEART FAILURE:  -EF 30-35%  -She is down 10.7  liters since admission, Down 835 ml since yesterday. -Wt  down 3 pounds from yesterday, now 189. Weight PTA on 10/12/16 was 212. Wt 1 year ago 09/2015 was 180. Still has edema, no DOE. -Still needs additional diuresis -Continue BID lasix for now - Consider transitioning to oral dosing in preparation for discharge.  -Heart rate 80's-low 100's. SBP 114-130. Has been tolerating carvedilol 6.25 mg, consider titrating up for better heart rate.  -Currently no ACE-I/ARB due to kidney function  Renal function remaining stable   CKD:  -Stage 2-3 baseline.  -Creat elevated to 1.97 and is now 1.77 -She still has volume overload but improving. She is  keeping her feet elevated, continue to diuresis.  And compression stockings were added.  -Will continue IV lasix until creatinine improved.   HTN:  -BP is controlled.  -Hydralazine 10 mg TID -Imdur 15 mg daily    Berton Bon, NP  11/06/2016 8:09 AM    [facility] Pager: [phone]  Patient with edema no murmur clear lungs obese. Continue iv diuretics today and change To PO in am can have outpatient myovue but clinical picture that of NIDCM increase coreg To 12.5 bid  Charlton Haws

## 2016-11-07 LAB — GLUCOSE, CAPILLARY
GLUCOSE-CAPILLARY: 242 mg/dL — AB (ref 65–99)
GLUCOSE-CAPILLARY: 250 mg/dL — AB (ref 65–99)
GLUCOSE-CAPILLARY: 214 mg/dL — AB (ref 65–99)
GLUCOSE-CAPILLARY: 255 mg/dL — AB (ref 65–99)
Glucose-Capillary: 207 mg/dL — ABNORMAL HIGH (ref 65–99)

## 2016-11-07 LAB — BASIC METABOLIC PANEL
Anion gap: 12 (ref 5–15)
BUN: 32 mg/dL — ABNORMAL HIGH (ref 6–20)
CHLORIDE: 98 mmol/L — AB (ref 101–111)
CO2: 28 mmol/L (ref 22–32)
Calcium: 8.5 mg/dL — ABNORMAL LOW (ref 8.9–10.3)
Creatinine, Ser: 1.85 mg/dL — ABNORMAL HIGH (ref 0.44–1.00)
GFR, EST AFRICAN AMERICAN: 41 mL/min — AB (ref >60)
GFR, EST NON AFRICAN AMERICAN: 35 mL/min — AB (ref >60)
Glucose, Bld: 193 mg/dL — ABNORMAL HIGH (ref 65–99)
Potassium: 4.2 mmol/L (ref 3.5–5.1)
SODIUM: 138 mmol/L (ref 135–145)

## 2016-11-07 MED ORDER — FUROSEMIDE 40 MG PO TABS: 40.0000 mg | ORAL_TABLET | Freq: Two times a day (BID) | ORAL | Status: DC

## 2016-11-07 MED ORDER — CARVEDILOL 6.25 MG PO TABS
6.2500 mg | ORAL_TABLET | Freq: Two times a day (BID) | ORAL | Status: DC
  Administered 2016-11-07 – 2016-11-08 (×2): 6.25 mg via ORAL
  Filled 2016-11-07 (×2): qty 1

## 2016-11-07 MED ORDER — HYDRALAZINE HCL 10 MG PO TABS
10.0000 mg | ORAL_TABLET | Freq: Three times a day (TID) | ORAL | Status: DC
Start: 2016-11-07 — End: 2016-11-08
  Administered 2016-11-07 – 2016-11-08 (×4): 10 mg via ORAL
  Filled 2016-11-07 (×4): qty 1

## 2016-11-07 MED ORDER — ISOSORBIDE MONONITRATE ER 30 MG PO TB24
15.0000 mg | ORAL_TABLET | Freq: Every day | ORAL | Status: DC
  Administered 2016-11-08: 15 mg via ORAL
  Filled 2016-11-07: qty 1

## 2016-11-07 MED ORDER — FUROSEMIDE 40 MG PO TABS
40.0000 mg | ORAL_TABLET | Freq: Two times a day (BID) | ORAL | Status: DC
  Administered 2016-11-07 – 2016-11-08 (×2): 40 mg via ORAL
  Filled 2016-11-07 (×2): qty 1

## 2016-11-07 NOTE — Progress Notes (Signed)
Inpatient Diabetes Program Recommendations  AACE/ADA: New Consensus Statement on Inpatient Glycemic Control (2015)  Target Ranges:  Prepandial:   less than 140 mg/dL      Peak postprandial:   less than 180 mg/dL (1-2 hours)      Critically ill patients:  140 - 180 mg/dL   Lab Results  Component Value Date   GLUCAP 242 (H) 11/07/2016   HGBA1C 10.1 (H) 10/30/2016    Review of Glycemic Control:  Results for NAIYELI, ROTELLA (MRN [id number]) as of 11/07/2016 10:45  Ref. Range 11/06/2016 11:23 11/06/2016 16:25 11/06/2016 22:13 11/07/2016 07:32 11/07/2016 09:14  Glucose-Capillary Latest Ref Range: 65 - 99 mg/dL 914 (H) 782 (H) 956 (H) 207 (H) 242 (H)   Diabetes history: Type 2 diabetes Outpatient Diabetes medications: Metformin 500 mg bid Current orders for Inpatient glycemic control:  Novolog sensitive tid with meals, Lantus 5 units daily  Inpatient Diabetes Program Recommendations:    Please consider d/c of Lantus and add Novolog 70/30- 6 units bid with meals.   Thanks, Beryl Meager, RN, BC-ADM Inpatient Diabetes Coordinator Pager [phone] (8a-5p)

## 2016-11-07 NOTE — Progress Notes (Signed)
Paged MD regarding lasix PO order. Pt received 60mg  IV lasix at 0534. MD returned page and stated to begin lasix PO for evening dose  Rhea Thrun

## 2016-11-07 NOTE — Progress Notes (Signed)
PROGRESS NOTE  Lisa Hodges ZOX:[id number] DOB: Nov 11, 1985 DOA: 10/26/2016 PCP: No PCP Per Patient   LOS: 12 days   Brief Narrative: 31 y.o.F w/ hx of hypertension, diabetes mellitus, and missed abortion in 2017 who presented with complaints of shortness of breath and worsening lower extremity edema over 4 weeks.  Her sx did not improve after being tx for PNA as an outpt.  Labs in the ED noted a troponin of 0.06, BNP of 867, chest x-ray significant for worsening multifocal infiltrates and small pleural effusion.  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 - On admission, weight was 216 >> 187 today. She is a little bit orthostatic this morning, stop IV Lasix and convert to by mouth Lasix. If stable over the next 24 hours, may be able to be discharged home to/14. Discussed with Dr. Eden Emms from cardiology  Acute kidney injury - The most recent creatinine baseline is from 10/26/2015 which was 1.1. - Presented with creatinine of 1.82, BUN: 24.  - Patient with proteinuria last year in the nephrotic range, she still has proteinuria about 1 g on the most recent UA this hospitalization, she is hypoalbuminemic at 1.3. This is likely in the setting of poorly controlled diabetes. ANA  negative. Will need follow-up with nephrology as an outpatient - Cr has stabilized around 1.7-1.9  Acute hypoxic respiratory failure due to Pulmonary Edema  - Initially required up to 6 L of oxygen, wean oxygen as tolerated, on room air now  Rhinovirus URI - Confirmed on viral resp panel 10/26/16 - impoving  HTN - Blood pressure well controlled throughout admission - Patient takes Coreg, Imdur, hydralazine and lasix.  - Hypotensive this morning, covert IV Lasix to by mouth.  DM type II uncontrolled - CBGs reasonably controlled in the hospital, on carbohydrate modified diet and SSI. - A1c is 10.1, correlate with mean plasma glucose of 243, indicates poor glycemic control as outpatient. - continue Lantus - Will likely benefit from 70/35 units twice a day on discharge, given poor financial resources.  Morbid Obesity  - Body mass index is 44.15 kg/m   DVT prophylaxis: Heparin subcutaneous. Code Status: Full. Family Communication: None at bedside Disposition Plan: home 1 day  Consultants:  Cardiology   Procedures:   None  Antimicrobials:  None.  Subjective: - no chest pain, shortness of breath, no abdominal pain, nausea or vomiting. No complaints. Wants to go home  Objective: Vitals:   11/07/16 0906 11/07/16 0910 11/07/16 0916 11/07/16 1031  BP: (!) 84/67 (!) 80/60 109/72 97/68  Pulse: 84 88 82 68  Resp: 18 18 18 18   Temp:      TempSrc:      SpO2: 100% 99% 100% 100%  Weight:      Height:        Intake/Output Summary (Last 24  hours) at 11/07/16 1052 Last data filed at 11/07/16 7014  Gross per 24 hour  Intake             1080 ml  Output              400 ml  Net              680 ml   Filed Weights   11/05/16 0635 11/06/16 0423 11/07/16 0436  Weight: 87.5 kg (192 lb 12.8 oz) 86.1 kg (189 lb 14.4 oz) 85.1 kg (187 lb 11.2 oz)    Examination: Constitutional: NAD Vitals:   11/07/16 0906 11/07/16 0910 11/07/16 0916 11/07/16 1031  BP: (!) 84/67 (!) 80/60  109/72 97/68  Pulse: 84 88 82 68  Resp: 18 18 18 18   Temp:      TempSrc:      SpO2: 100% 99% 100% 100%  Weight:      Height:       ENMT: Mucous membranes are moist.  Neck: normal, supple. Respiratory: clear to auscultation bilaterally, no wheezing, no crackles. Normal respiratory effort. No accessory muscle use.  Cardiovascular: Regular rate and rhythm, no murmurs / rubs / gallops. Trace LE edema Abdomen: no tenderness. Bowel sounds positive.  Neurologic: Nonfocal. Psychiatric: Normal judgment and insight. Alert and oriented x 3. Normal mood.    Data Reviewed: I have personally reviewed following labs and imaging studies  CBC:  Recent Labs Lab 11/03/16 0353  WBC 5.9  HGB 8.5*  HCT 28.8*  MCV 62.5*  PLT 408*   Basic Metabolic Panel:  Recent Labs Lab 11/03/16 0353 11/04/16 0416 11/05/16 0652 11/06/16 0912 11/07/16 0428  NA 136 135 135 137 138  K 3.9 4.3 4.2 4.0 4.2  CL 98* 100* 100* 99* 98*  CO2 28 26 27 27 28   GLUCOSE 246* 190* 210* 243* 193*  BUN 22* 25* 26* 28* 32*  CREATININE 1.76* 1.70* 1.77* 1.91* 1.85*  CALCIUM 7.9* 8.0* 8.4* 8.4* 8.5*   GFR: Estimated Creatinine Clearance: 39.4 mL/min (by C-G formula based on SCr of 1.85 mg/dL (H)). Liver Function Tests: No results for input(s): AST, ALT, ALKPHOS, BILITOT, PROT, ALBUMIN in the last 168 hours. Cardiac Enzymes: No results for input(s): CKTOTAL, CKMB, CKMBINDEX, TROPONINI in the last 168 hours. BNP (last 3 results) No results for input(s): PROBNP in the last 8760 hours. HbA1C: No results for input(s): HGBA1C in the last 72 hours. CBG:  Recent Labs Lab 11/06/16 1123 11/06/16 1625 11/06/16 2213 11/07/16 0732 11/07/16 0914  GLUCAP 176* 272* 242* 207* 242*   Urine analysis:    Component Value Date/Time   COLORURINE STRAW (A) 10/28/2016 1806   APPEARANCEUR CLEAR 10/28/2016 1806   LABSPEC 1.006 10/28/2016 1806   PHURINE 6.0 10/28/2016 1806   GLUCOSEU 50 (A) 10/28/2016 1806   HGBUR SMALL (A)  10/28/2016 1806   BILIRUBINUR NEGATIVE 10/28/2016 1806   KETONESUR NEGATIVE 10/28/2016 1806   PROTEINUR 100 (A) 10/28/2016 1806   UROBILINOGEN 0.2 09/01/2014 2124   NITRITE NEGATIVE 10/28/2016 1806   LEUKOCYTESUR NEGATIVE 10/28/2016 1806   Sepsis Labs: Invalid input(s): PROCALCITONIN, LACTICIDVEN  No results found for this or any previous visit (from the past 240 hour(s)).  Radiology Studies: No results found.  Scheduled Meds: . carvedilol  6.25 mg Oral BID WC  . furosemide  40 mg Oral BID  . heparin  5,000 Units Subcutaneous Q8H  . hydrALAZINE  10 mg Oral Q8H  . insulin aspart  0-9 Units Subcutaneous TID WC  .  insulin glargine  5 Units Subcutaneous Daily  . [START ON 11/08/2016] isosorbide mononitrate  15 mg Oral Daily   Continuous Infusions:  Costin M. Elvera Lennox, MD [facility] [phone]  If 7PM-7AM, please contact night-coverage [email or web address] Password [facility] 11/07/2016, 10:52 AM

## 2016-11-07 NOTE — Progress Notes (Signed)
Instructed patient to get out of bed/change sitting positions carefully to avoid effects of orthostatic hypotension after ortho hypotension episode earlier this morning.

## 2016-11-07 NOTE — Progress Notes (Addendum)
Stopped MD in hallway, pt blood pressure low. Pt states she was up bathing herself. Had pt lay in bed for 10 minutes. Pt blood pressure up to 109/72  MD aware  Maygen Sirico Elige Radon

## 2016-11-07 NOTE — Progress Notes (Addendum)
Progress Note  Patient Name: Lisa Hodges Date of Encounter: 11/07/2016  Primary Cardiologist: Dr. Daiva Nakayama (new)   Subjective   No chest pain and improved SOB and edema. She is wearing compression stockings. She is saving all urine for better accuracy of I&O. She's been up walking in the halls and feeling much better. Anxious to go home.  Inpatient Medications    Scheduled Meds: . carvedilol  12.5 mg Oral BID WC  . furosemide  40 mg Oral BID  . heparin  5,000 Units Subcutaneous Q8H  . hydrALAZINE  10 mg Oral Q8H  . insulin aspart  0-9 Units Subcutaneous TID WC  . insulin glargine  5 Units Subcutaneous Daily  . isosorbide mononitrate  15 mg Oral Daily   Continuous Infusions:  PRN Meds: acetaminophen, albuterol, alum & mag hydroxide-simeth, guaiFENesin-dextromethorphan, hydrALAZINE, ondansetron (ZOFRAN) IV   Vital Signs    Vitals:   11/06/16 1200 11/06/16 2110 11/07/16 0436 11/07/16 0749  BP: 101/69 122/83 118/85 122/82  Pulse: 92 90 99 89  Resp: 18 18 18 18   Temp: 98.6 F (37 C) 98.2 F (36.8 C) 98.2 F (36.8 C) 98.2 F (36.8 C)  TempSrc: Oral Oral Oral Oral  SpO2: 99% 100% 100% 99%  Weight:   187 lb 11.2 oz (85.1 kg)   Height:        Intake/Output Summary (Last 24 hours) at 11/07/16 0805 Last data filed at 11/07/16 0600  Gross per 24 hour  Intake             1200 ml  Output                0 ml  Net             1200 ml   Filed Weights   11/05/16 0635 11/06/16 0423 11/07/16 0436  Weight: 192 lb 12.8 oz (87.5 kg) 189 lb 14.4 oz (86.1 kg) 187 lb 11.2 oz (85.1 kg)    Telemetry   SR to ST rate 80's-low 100's with exertion - Personally Reviewed  ECG    No new since 10/29/16 - Personally Reviewed  Physical Exam   GEN: Pleasant, obese, No acute distress.   Neck: No JVD Cardiac: RRR, no murmurs, rubs, or gallops.  Respiratory: Clear to auscultation bilaterally.  GI: obese,Soft, nontender, non-distended  MS:  1+  Lower ext edema but improved; No  deformity. Compression stockings in place Neuro:  Nonfocal  Psych: Normal affect   Labs    Chemistry  Recent Labs Lab 11/05/16 0652 11/06/16 0912 11/07/16 0428  NA 135 137 138  K 4.2 4.0 4.2  CL 100* 99* 98*  CO2 27 27 28   GLUCOSE 210* 243* 193*  BUN 26* 28* 32*  CREATININE 1.77* 1.91* 1.85*  CALCIUM 8.4* 8.4* 8.5*  GFRNONAA 37* 34* 35*  GFRAA 43* 39* 41*  ANIONGAP 8 11 12      Hematology  Recent Labs Lab 11/03/16 0353  WBC 5.9  RBC 4.61  HGB 8.5*  HCT 28.8*  MCV 62.5*  MCH 18.4*  MCHC 29.5*  RDW 16.5*  PLT 408*     Radiology    PA CXR 21/18-  CHEST  2 VIEW  COMPARISON:  Chest radiograph October 12, 2016  FINDINGS: Worsening multifocal consolidation. Small LEFT pleural effusion. Mild cardiomegaly even with consideration to this low inspiratory portable examination with crowded vascular markings. No pneumothorax. Soft tissue planes and included osseous structures are nonsuspicious.  IMPRESSION: Worsening multifocal pneumonia.  Small pleural  effusion.  Stable cardiomegaly.   Cardiac Studies    Echo 10/26/16- Study Conclusions  - Left ventricle: The cavity size was normal. Wall thickness was increased in a pattern of mild LVH. Systolic function was moderately to severely reduced. The estimated ejection fraction was in the range of 30% to 35%. Diffuse hypokinesis. - Aortic valve: There was mild regurgitation. - Mitral valve: There was mild regurgitation. - Pericardium, extracardiac: A small pericardial effusion was identified.  Impressions:  - Moderate to severe global reduction in LV systolic function; mild AI and MR; small to moderate pericardial effusion with RA collapse but no RV diastolic collapse and IVC not dilated.    Patient Profile     31 y.o. female with a history of HTN, DM, (non compliant) and recent CAP-came back tot he ED 10/26/16 with dyspnea. BNP 867. Her CXR suggested worsening pneumonia but CHF  was suspected as well. Echo showed cardiomyopathy with an EF of 30% (presumably NICM with global HK). She has diuresed 10L, 25lbs.   Assessment & Plan    ACUTE SYSTOLIC HEART FAILURE:  -EF 30-35%-presumed NICM -She is down 10.7  liters since admission. -Wt down 2 pounds from yesterday, now 187. Weight PTA on 10/12/16 was 212. Wt 1 year ago 09/2015 was 180. Still has edema, no DOE. -Still needs additional diuresis -IV Lasix changed to 40 mg PO BID this am  -Heart rate 80's-low 100's. SBP 114-130. Coreg increased yesterday to 12.5 mg BID -Currently no ACE-I/ARB due to kidney function  Renal function remaining stable   CKD:  -Stage 2-3 baseline.  -Creat elevated to 1.97 and is now 1.77 -She still has volume overload but improving. She is  keeping her feet elevated, continue to diuresis.  And compression stockings were added.  -Will continue IV lasix until creatinine improved.   HTN:  -BP is controlled.  -Hydralazine 10 mg TID -Imdur 15 mg daily  IDDM: Per primary service. She has no PCP, goint to ED to get her medications refilled. She will need arrangements for f/u at discharge.   Plan: Probably close to discharge. Goal wgt will be between 180-185 lbs. She will need TOC office visit after discharge- ? Tomorrow.   [name], [place]  11/07/2016 8:05 AM    [facility] Pager: [phone]  Patient examined chart reviewed. Obese black female some LE edema still Mildly postural walking this am change to PO diuretics should be ok for d/c in am No murmur clear lungs   Charlton Haws

## 2016-11-07 NOTE — Progress Notes (Signed)
Paged cardiology regarding pt low BP and cardiac medications have no parameters. MD stated he will look over her chart and adjust parameters as needed. Pt laying in bed resting  Lisa Hodges

## 2016-11-08 DIAGNOSIS — I5021 Acute systolic (congestive) heart failure: Secondary | ICD-10-CM

## 2016-11-08 LAB — GLUCOSE, CAPILLARY
Glucose-Capillary: 218 mg/dL — ABNORMAL HIGH (ref 65–99)
Glucose-Capillary: 225 mg/dL — ABNORMAL HIGH (ref 65–99)

## 2016-11-08 LAB — BASIC METABOLIC PANEL
Anion gap: 10 (ref 5–15)
BUN: 33 mg/dL — AB (ref 6–20)
CALCIUM: 8.3 mg/dL — AB (ref 8.9–10.3)
CHLORIDE: 98 mmol/L — AB (ref 101–111)
CO2: 26 mmol/L (ref 22–32)
CREATININE: 1.89 mg/dL — AB (ref 0.44–1.00)
GFR calc non Af Amer: 34 mL/min — ABNORMAL LOW (ref >60)
GFR, EST AFRICAN AMERICAN: 40 mL/min — AB (ref >60)
Glucose, Bld: 257 mg/dL — ABNORMAL HIGH (ref 65–99)
Potassium: 4.3 mmol/L (ref 3.5–5.1)
Sodium: 134 mmol/L — ABNORMAL LOW (ref 135–145)

## 2016-11-08 MED ORDER — INSULIN ASPART PROT & ASPART (70-30 MIX) 100 UNIT/ML ~~LOC~~ SUSP: 6.0000 [IU] | Freq: Two times a day (BID) | SUBCUTANEOUS | 0 refills | Status: DC

## 2016-11-08 MED ORDER — HYDRALAZINE HCL 10 MG PO TABS: 10.0000 mg | ORAL_TABLET | Freq: Three times a day (TID) | ORAL | 0 refills | Status: DC

## 2016-11-08 MED ORDER — CARVEDILOL 6.25 MG PO TABS: 6.2500 mg | ORAL_TABLET | Freq: Two times a day (BID) | ORAL | 0 refills | Status: DC

## 2016-11-08 MED ORDER — ISOSORBIDE MONONITRATE ER 30 MG PO TB24: 15.0000 mg | ORAL_TABLET | Freq: Every day | ORAL | 0 refills | Status: DC

## 2016-11-08 MED ORDER — FUROSEMIDE 40 MG PO TABS: 40.0000 mg | ORAL_TABLET | Freq: Every day | ORAL | 0 refills | Status: DC

## 2016-11-08 MED ORDER — "INSULIN SYRINGE 31G X 5/16"" 0.3 ML MISC": 1.0000 | Freq: Two times a day (BID) | 0 refills | Status: DC

## 2016-11-08 MED FILL — CARVEDILOL 6.25 MG TABLET: 6.25 | 30 days supply | Qty: 60 | Fill #0

## 2016-11-08 MED FILL — ULTICARE SYR 0.3 ML 30GX5/1: 30G X 5/16" | 30 days supply | Qty: 60 | Fill #0

## 2016-11-08 MED FILL — NOVOLOG MIX 70/30 VIAL: (70-30) 100 | 30 days supply | Qty: 10 | Fill #0

## 2016-11-08 MED FILL — FUROSEMIDE 40 MG TABLET: 40 | 30 days supply | Qty: 30 | Fill #0

## 2016-11-08 MED FILL — ISOSORBIDE MN ER 30 MG TAB: 30 | 30 days supply | Qty: 15 | Fill #0

## 2016-11-08 MED FILL — hydrALAZINE HCL 10 MG TABS: 10 | 30 days supply | Qty: 90 | Fill #0

## 2016-11-08 NOTE — Discharge Summary (Signed)
Physician Discharge Summary  Soldier ZOX:[id number] DOB: November 16, 1985 DOA: 10/26/2016  PCP: No PCP Per Patient  Admit date: 10/26/2016 Discharge date: 11/08/2016  Admitted From: Home Disposition:  Home  Recommendations for Outpatient Follow-up:  1. Follow up with PCP in 1 week. Patient was given multiple resources for uninsured patients to establish with primary care providers. 2. Follow up with Cardiology in 7-10 days  3. May need referral to Nephrology if Cr continues to be elevated. Patient had proteinuria last year in the nephrotic range, most likely related to her poorly controlled DM . Please obtain BMP in 1 week  4. Diabetic education outpatient referral ordered   Home Health: No  Equipment/Devices: No   Discharge Condition: Stable CODE STATUS: Full  Diet recommendation: Heart healthy   Brief/Interim Summary: From H&P: Lisa Hodges  is a 31 y.o. female with past medical history of hypertension, diabetes mellitus, missed abortion last year, who presents with complaints of shortness of breath, worsening lower ischemic edema over last 4 weeks. She was diagnosed with pneumonia couple weeks ago, discharged on PO antibiotics, but she still reports shortness of breath, with worsening lower extremity edema, which prompted her to come back in ED. She denies any fever, chills, productive cough or sputum, or chest pain. She feels some fluid weight gain, she denies any chest pain. In ED, workup significant for return for chest x-ray significant for worsening multifocal pneumonia and small pleural effusion.   Interim: Cardiology was consulted for suspected CHF. Echo was completed which showed reduced systolic function with EF 30%. Etiology of her cardiomyopathy is not clear, cardiology is planning for further workup as outpatient. Patient was diuresed and beta blocker was added. She was aggressively diuresed with IV lasix which was transitioned to oral. She did have symptomatic orthostasis and lasix  was decreased in dose.   Subjective on day of discharge: Orthostatic VS this morning was positive, but she was asymptomatic. She is ambulating in the hall. No complaints today. Denies any chest pain, shortness of breath. Peripheral edema continues to improve. Patient is very anxious to go home today. We discussed the importance of getting out of bed very slowly to allow her body to adjust to avoid any syncopal episodes. I discussed with her possibility of delaying discharge until her orthostatic vital sign is stable, the patient was quite adamant about going home today. Patient voices understanding and is willing to keep her eye on her symptoms.  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 Will plan to follow in office as outpatient in 7-10 days.  - Currently on Coreg, Imdur and hydralazine. - No ACE/ARB due to AKI  - On admission, weight was 216 >> 185 today - Will discharge with decreased lasix dose of 72m PO daily. Discussed low salt, fluid restriction diet today.   Acute kidney injury - The most recent  creatinine baseline is from 10/26/2015 which was 1.1. - Presented with creatinine of 1.82, BUN: 24.  - Patient with proteinuria last year in the nephrotic range, she still has proteinuria about 1 g  on the most recent UA this hospitalization, she is hypoalbuminemic at 1.3. This is likely in the setting of poorly controlled diabetes. ANA negative. Will need follow-up with nephrology as an outpatient - Cr has stabilized around 1.7-1.9   Acute hypoxic respiratory failuredue to Pulmonary Edema  - Initially required up to 6 L of oxygen, wean oxygen as tolerated, on room air now  Rhinovirus URI - Confirmed on viral resp panel 10/26/16 - Impoving  HTN - Blood pressure well controlled throughout admission - Patient takes Coreg, Imdur, hydralazine and lasix.  - BP this morning is stable, although is orthostatic positive without any symptoms. Will decrease lasix dose prior to discharge   DMtype II uncontrolled - CBGs reasonably controlledin the hospital, on carbohydrate modified diet and SSI. - A1c is 10.1, correlate with mean plasma glucose of 243, indicates poor glycemic control as outpatient. - Will discharge with novolog 70/30 6u BID with meals - Outpatient diabetic coordinator referral placed   Morbid Obesity  - Body mass index is 44.15 kg/m  Discharge Instructions  Discharge Instructions    (HEART FAILURE PATIENTS) Call MD:  Anytime you have any of the following symptoms: 1) 3 pound weight gain in 24 hours or 5 pounds in 1 week 2) shortness of breath, with or without a dry hacking cough 3) swelling in the hands, feet or stomach 4) if you have to sleep on extra pillows at night in order to breathe.    Complete by:  As directed    Ambulatory referral to Nutrition and Diabetic Education    Complete by:  As directed    A1C=10.1   Call MD for:  difficulty breathing, headache or visual disturbances    Complete by:  As directed    Call MD for:  persistant dizziness or light-headedness    Complete by:  As directed    Diet - low sodium heart healthy    Complete by:  As directed    Increase activity slowly    Complete by:  As directed      Allergies as of 11/08/2016   No Known  Allergies     Medication List    STOP taking these medications   azithromycin 250 MG tablet Commonly known as:  ZITHROMAX   DAYQUIL PO   hydrochlorothiazide 25 MG tablet Commonly known as:  HYDRODIURIL   oxyCODONE-acetaminophen 5-325 MG tablet Commonly known as:  PERCOCET/ROXICET   sulfamethoxazole-trimethoprim 800-160 MG tablet Commonly known as:  BACTRIM DS,SEPTRA DS     TAKE these medications   carvedilol 6.25 MG tablet Commonly known as:  COREG Take 1 tablet (6.25 mg total) by mouth 2 (two) times daily with a meal.   furosemide 40 MG tablet Commonly known as:  LASIX Take 1 tablet (40 mg total) by mouth daily.   glucose monitoring kit monitoring kit 1 each by Does not apply route as needed for other.   hydrALAZINE 10 MG tablet Commonly known as:  APRESOLINE Take 1 tablet (10 mg total) by mouth 3 (three) times daily.   insulin aspart protamine- aspart (70-30) 100 UNIT/ML injection Commonly known as:  NOVOLOG MIX 70/30 Inject 0.06 mLs (6 Units total) into the skin 2 (two) times daily with a meal.   INSULIN SYRINGE .3CC/31GX5/16" 31G X 5/16" 0.3 ML Misc 1  applicator by Does not apply route 2 (two) times daily.   isosorbide mononitrate 30 MG 24 hr tablet Commonly known as:  IMDUR Take 0.5 tablets (15 mg total) by mouth daily. Start taking on:  11/09/2016   metFORMIN 500 MG tablet Commonly known as:  GLUCOPHAGE Take 1 tablet (500 mg total) by mouth 2 (two) times daily with a meal.   promethazine 25 MG tablet Commonly known as:  PHENERGAN Take 1 tablet (25 mg total) by mouth every 6 (six) hours as needed.      Follow-up Information    Please use the resources provided to you in emergency room by case manager to assist in your choice of doctor for follow up. Schedule an appointment as soon as possible for a visit.   Contact information:  These [place] uninsured resources provide possible primary care providers, resources for discounted medications,  housing,        [place], [place] Follow up.   Specialties:  Cardiology, Radiology Why:  Office will contact you for follow up Contact information: [place]  [address] [phone]          No Known Allergies  Consultations:  Cardiology    Procedures/Studies: Dg Chest 2 View  Result Date: 10/26/2016 CLINICAL DATA:  Follow-up pneumonia. Shortness of breath for 2 days, worsening today. Hypertensive. History diabetes. EXAM: CHEST  2 VIEW COMPARISON:  Chest radiograph October 12, 2016 FINDINGS: Worsening multifocal consolidation. Small LEFT pleural effusion. Mild cardiomegaly even with consideration to this low inspiratory portable examination with crowded vascular markings. No pneumothorax. Soft tissue planes and included osseous structures are nonsuspicious. IMPRESSION: Worsening multifocal pneumonia.  Small pleural effusion. Stable cardiomegaly. Electronically Signed   By: Elon Alas M.D.   On: 10/26/2016 03:20   Dg Chest 2 View  Result Date: 10/12/2016 CLINICAL DATA:  Productive cough.  Chest pain.  Dyspnea. EXAM: CHEST  2 VIEW COMPARISON:  None. FINDINGS: Low lung volumes. Top-normal heart size. Normal mediastinal contour. No pneumothorax. Trace right and small left pleural effusions. No pulmonary edema. Patchy consolidation in both lower lobes, left greater than right. IMPRESSION: 1. Patchy bilateral lower lobe consolidation, left greater than right, suspicious for multilobar pneumonia. 2. Small left and trace right pleural effusions. 3. Recommend follow-up PA and lateral post treatment chest radiographs in 4-6 weeks. Electronically Signed   By: Ilona Sorrel M.D.   On: 10/12/2016 15:52   US Renal  Result Date: 10/28/2016 CLINICAL DATA:  Acute renal failure EXAM: RENAL / URINARY TRACT ULTRASOUND COMPLETE COMPARISON:  None. FINDINGS: Right Kidney: Length: 9.9 cm. Slight increased echogenicity is noted. No obstructive changes are seen. Left Kidney:  Length: 10.1 cm. Echogenicity within normal limits. No mass or hydronephrosis visualized. Bladder: Well distended. Incidental note is made of bilateral pleural effusions. IMPRESSION: No obstructive changes are noted. Slight increased echogenicity of the right kidney is noted. Small bilateral pleural effusions. Electronically Signed   By: Inez Catalina M.D.   On: 10/28/2016 20:19    Echo  Study Conclusions  - Left ventricle: The cavity size was normal. Wall thickness was   increased in a pattern of mild LVH. Systolic function was   moderately to severely reduced. The estimated ejection fraction   was in the range of 30% to 35%. Diffuse hypokinesis. - Aortic valve: There was mild regurgitation. - Mitral valve: There was mild regurgitation. - Pericardium, extracardiac: A small pericardial effusion was   identified.  Impressions:  - Moderate to severe global  reduction in LV systolic function; mild   AI and MR; small to moderate pericardial effusion with RA collapse but no RV diastolic collapse and IVC not dilated.   Discharge Exam: Vitals:   11/08/16 0508 11/08/16 0754  BP: 121/74   Pulse: 97 86  Resp: 20 20  Temp: 98.4 F (36.9 C) 97.3 F (36.3 C)   Vitals:   11/07/16 1700 11/07/16 2126 11/08/16 0508 11/08/16 0754  BP: (!) 141/96 119/77 121/74   Pulse: 96 94 97 86  Resp: 18 20 20 20   Temp:  98.6 F (37 C) 98.4 F (36.9 C) 97.3 F (36.3 C)  TempSrc:  Oral Oral Oral  SpO2: 100% 99% 100%   Weight:   84 kg (185 lb 3.2 oz)   Height:        General: Pt is alert, awake, not in acute distress Cardiovascular: RRR, S1/S2 +, no rubs, no gallops Respiratory: CTA bilaterally, no wheezing, no rhonchi Abdominal: Soft, NT, ND, bowel sounds + Extremities: +1 edema, no cyanosis    The results of significant diagnostics from this hospitalization (including imaging, microbiology, ancillary and laboratory) are listed below for reference.     Microbiology: No results found for this  or any previous visit (from the past 240 hour(s)).   Labs: BNP (last 3 results)  Recent Labs  10/26/16 0249 10/28/16 0242  BNP 867.1* 858.8*   Basic Metabolic Panel:  Recent Labs Lab 11/04/16 0416 11/05/16 0652 11/06/16 0912 11/07/16 0428 11/08/16 0330  NA 135 135 137 138 134*  K 4.3 4.2 4.0 4.2 4.3  CL 100* 100* 99* 98* 98*  CO2 26 27 27 28 26   GLUCOSE 190* 210* 243* 193* 257*  BUN 25* 26* 28* 32* 33*  CREATININE 1.70* 1.77* 1.91* 1.85* 1.89*  CALCIUM 8.0* 8.4* 8.4* 8.5* 8.3*   Liver Function Tests: No results for input(s): AST, ALT, ALKPHOS, BILITOT, PROT, ALBUMIN in the last 168 hours. No results for input(s): LIPASE, AMYLASE in the last 168 hours. No results for input(s): AMMONIA in the last 168 hours. CBC:  Recent Labs Lab 11/03/16 0353  WBC 5.9  HGB 8.5*  HCT 28.8*  MCV 62.5*  PLT 408*   Cardiac Enzymes: No results for input(s): CKTOTAL, CKMB, CKMBINDEX, TROPONINI in the last 168 hours. BNP: Invalid input(s): POCBNP CBG:  Recent Labs Lab 11/07/16 0914 11/07/16 1125 11/07/16 1654 11/07/16 2120 11/08/16 0731  GLUCAP 242* 250* 214* 255* 218*   D-Dimer No results for input(s): DDIMER in the last 72 hours. Hgb A1c No results for input(s): HGBA1C in the last 72 hours. Lipid Profile No results for input(s): CHOL, HDL, LDLCALC, TRIG, CHOLHDL, LDLDIRECT in the last 72 hours. Thyroid function studies No results for input(s): TSH, T4TOTAL, T3FREE, THYROIDAB in the last 72 hours.  Invalid input(s): FREET3 Anemia work up No results for input(s): VITAMINB12, FOLATE, FERRITIN, TIBC, IRON, RETICCTPCT in the last 72 hours. Urinalysis    Component Value Date/Time   COLORURINE STRAW (A) 10/28/2016 1806   APPEARANCEUR CLEAR 10/28/2016 1806   LABSPEC 1.006 10/28/2016 1806   PHURINE 6.0 10/28/2016 1806   GLUCOSEU 50 (A) 10/28/2016 1806   HGBUR SMALL (A) 10/28/2016 1806   BILIRUBINUR NEGATIVE 10/28/2016 1806   KETONESUR NEGATIVE 10/28/2016 1806    PROTEINUR 100 (A) 10/28/2016 1806   UROBILINOGEN 0.2 09/01/2014 2124   NITRITE NEGATIVE 10/28/2016 1806   LEUKOCYTESUR NEGATIVE 10/28/2016 1806   Sepsis Labs Invalid input(s): PROCALCITONIN,  WBC,  LACTICIDVEN Microbiology No results found for this or  any previous visit (from the past 240 hour(s)).   Time coordinating discharge: 45 minutes  SIGNED:  Dessa Phi, DO [facility] Pager [phone]  If 7PM-7AM, please contact night-coverage [email or web address] Password [facility] 11/08/2016, 12:07 PM

## 2016-11-08 NOTE — Progress Notes (Signed)
Paperwork for the [facility] ( Medication Assistance Through [facility]) given to patient with instructions on usage for possible discharge home today; Alexis Goodell [phone]

## 2016-11-08 NOTE — Progress Notes (Signed)
Progress Note  Patient Name: Lisa Hodges Date of Encounter: 11/08/2016  Primary Cardiologist: Dr. Daiva Nakayama (new)   Subjective   No chest pain and improved SOB and edema. She is wearing compression stockings. She is saving all urine for better accuracy of I&O. She's been up walking in the halls and feeling much better. Anxious to go home.  Inpatient Medications    Scheduled Meds: . carvedilol  6.25 mg Oral BID WC  . furosemide  40 mg Oral BID  . heparin  5,000 Units Subcutaneous Q8H  . hydrALAZINE  10 mg Oral Q8H  . insulin aspart  0-9 Units Subcutaneous TID WC  . insulin glargine  5 Units Subcutaneous Daily  . isosorbide mononitrate  15 mg Oral Daily   Continuous Infusions:  PRN Meds: acetaminophen, albuterol, alum & mag hydroxide-simeth, guaiFENesin-dextromethorphan, hydrALAZINE, ondansetron (ZOFRAN) IV   Vital Signs    Vitals:   11/07/16 1700 11/07/16 2126 11/08/16 0508 11/08/16 0754  BP: (!) 141/96 119/77 121/74   Pulse: 96 94 97 86  Resp: 18 20 20 20   Temp:  98.6 F (37 C) 98.4 F (36.9 C) 97.3 F (36.3 C)  TempSrc:  Oral Oral Oral  SpO2: 100% 99% 100%   Weight:   185 lb 3.2 oz (84 kg)   Height:        Intake/Output Summary (Last 24 hours) at 11/08/16 0907 Last data filed at 11/08/16 0900  Gross per 24 hour  Intake              600 ml  Output             1601 ml  Net            -1001 ml   Filed Weights   11/06/16 0423 11/07/16 0436 11/08/16 0508  Weight: 189 lb 14.4 oz (86.1 kg) 187 lb 11.2 oz (85.1 kg) 185 lb 3.2 oz (84 kg)    Telemetry   SR to ST rate 80's-low 100's with exertion - Personally Reviewed  ECG    No new since 10/29/16 - Personally Reviewed  Physical Exam   GEN: Pleasant, obese, No acute distress.   Neck: No JVD Cardiac: RRR, no murmurs, rubs, or gallops.  Respiratory: Clear to auscultation bilaterally.  GI: obese,Soft, nontender, non-distended  MS:  trace Lower ext edema but improved; No deformity. Compression stockings  in place Neuro:  Nonfocal  Psych: Normal affect   Labs    Chemistry  Recent Labs Lab 11/06/16 0912 11/07/16 0428 11/08/16 0330  NA 137 138 134*  K 4.0 4.2 4.3  CL 99* 98* 98*  CO2 27 28 26   GLUCOSE 243* 193* 257*  BUN 28* 32* 33*  CREATININE 1.91* 1.85* 1.89*  CALCIUM 8.4* 8.5* 8.3*  GFRNONAA 34* 35* 34*  GFRAA 39* 41* 40*  ANIONGAP 11 12 10      Hematology  Recent Labs Lab 11/03/16 0353  WBC 5.9  RBC 4.61  HGB 8.5*  HCT 28.8*  MCV 62.5*  MCH 18.4*  MCHC 29.5*  RDW 16.5*  PLT 408*     Radiology    PA CXR 21/18-  CHEST  2 VIEW  COMPARISON:  Chest radiograph October 12, 2016  FINDINGS: Worsening multifocal consolidation. Small LEFT pleural effusion. Mild cardiomegaly even with consideration to this low inspiratory portable examination with crowded vascular markings. No pneumothorax. Soft tissue planes and included osseous structures are nonsuspicious.  IMPRESSION: Worsening multifocal pneumonia.  Small pleural effusion.  Stable cardiomegaly.  Cardiac Studies    Echo 10/26/16- Study Conclusions  - Left ventricle: The cavity size was normal. Wall thickness was increased in a pattern of mild LVH. Systolic function was moderately to severely reduced. The estimated ejection fraction was in the range of 30% to 35%. Diffuse hypokinesis. - Aortic valve: There was mild regurgitation. - Mitral valve: There was mild regurgitation. - Pericardium, extracardiac: A small pericardial effusion was identified.  Impressions:  - Moderate to severe global reduction in LV systolic function; mild AI and MR; small to moderate pericardial effusion with RA collapse but no RV diastolic collapse and IVC not dilated.    Patient Profile     31 y.o. female with a history of HTN, DM, (non compliant) and recent CAP-came back tot he ED 10/26/16 with dyspnea. BNP 867. Her CXR suggested worsening pneumonia but CHF was suspected as well. Echo  showed cardiomyopathy with an EF of 30% (presumably NICM with global HK). She has diuresed 11.4L, 27lbs. Wgt today 185  Assessment & Plan    ACUTE SYSTOLIC HEART FAILURE:  -EF 30-35%-presumed NICM -She is down 11.4  liters since admission. -Wt now 185. Weight PTA on 10/12/16 was 212. Wt 1 year ago 09/2015 was 180.  -Currently on Lasix 40 mg PO BID  -Heart rate 80's-low 100's. - SBP dropped yesterday with increased Coreg (12.5 mg BID) and she was symptomatic. I cut her Coreg back to 6.26 mg BID. 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  -Creat elevated to 1.97 and is now 1.89  HTN:  -BP is low-Coreg cut back  -Hydralazine 10 mg TID and Imdur 15 mg daily for CHF  IDDM: Per primary service. She has no PCP, goint to ED to get her medications refilled. She will need arrangements for f/u at discharge.   Plan: Should be OK for discharge if she tolerates her medications this am. I will arrange for her to see me in 7-10 days as an OP.   Consider decreasing her Lasix to 40 mg po daily at discharge.  [name], [place]  11/08/2016 9:07 AM    [facility] Pager: [phone]  Obese black female Doing better with no low BP agree with lowering d/c lasix to 40 mg daily  Ok to d/c home PA has arranged outpatient f/u with [place] Primary cardiologist is Hochrein  [employer]

## 2016-11-08 NOTE — Progress Notes (Signed)
Patient rested well overnight. No complaints of pain overnight. Patient in bed resting.

## 2016-11-08 NOTE — Progress Notes (Signed)
Pt has been discharged. No signs of distress. Pt IV has been removed and she verbalizes discharge instructions. Pt left ambulatory.

## 2016-11-08 NOTE — Discharge Instructions (Signed)
Heart Failure  Heart failure means your heart has trouble pumping blood. This makes it hard for your body to work well. Heart failure is usually a long-term (chronic) condition. You must take good care of yourself and follow your doctor's treatment plan.  HOME CARE   Take your heart medicine as told by your doctor.    Do not stop taking medicine unless your doctor tells you to.    Do not skip any dose of medicine.    Refill your medicines before they run out.    Take other medicines only as told by your doctor or pharmacist.   Stay active if told by your doctor. The elderly and people with severe heart failure should talk with a doctor about physical activity.   Eat heart-healthy foods. Choose foods that are without trans fat and are low in saturated fat, cholesterol, and salt (sodium). This includes fresh or frozen fruits and vegetables, fish, lean meats, fat-free or low-fat dairy foods, whole grains, and high-fiber foods. Lentils and dried peas and beans (legumes) are also good choices.   Limit salt if told by your doctor.   Cook in a healthy way. Roast, grill, broil, bake, poach, steam, or stir-fry foods.   Limit fluids as told by your doctor.   Weigh yourself every morning. Do this after you pee (urinate) and before you eat breakfast. Write down your weight to give to your doctor.   Take your blood pressure and write it down if your doctor tells you to.   Ask your doctor how to check your pulse. Check your pulse as told.   Lose weight if told by your doctor.   Stop smoking or chewing tobacco. Do not use gum or patches that help you quit without your doctor's approval.   Schedule and go to doctor visits as told.   Nonpregnant women should have no more than 1 drink a day. Men should have no more than 2 drinks a day. Talk to your doctor about drinking alcohol.   Stop illegal drug use.   Stay current with shots (immunizations).   Manage your health conditions as told by your doctor.   Learn to  manage your stress.   Rest when you are tired.   If it is really hot outside:    Avoid intense activities.    Use air conditioning or fans, or get in a cooler place.    Avoid caffeine and alcohol.    Wear loose-fitting, lightweight, and light-colored clothing.   If it is really cold outside:    Avoid intense activities.    Layer your clothing.    Wear mittens or gloves, a hat, and a scarf when going outside.    Avoid alcohol.   Learn about heart failure and get support as needed.   Get help to maintain or improve your quality of life and your ability to care for yourself as needed.  GET HELP IF:    You gain weight quickly.   You are more short of breath than usual.   You cannot do your normal activities.   You tire easily.   You cough more than normal, especially with activity.   You have any or more puffiness (swelling) in areas such as your hands, feet, ankles, or belly (abdomen).   You cannot sleep because it is hard to breathe.   You feel like your heart is beating fast (palpitations).   You get dizzy or light-headed when you stand up.  GET HELP   RIGHT AWAY IF:    You have trouble breathing.   There is a change in mental status, such as becoming less alert or not being able to focus.   You have chest pain or discomfort.   You faint.  MAKE SURE YOU:    Understand these instructions.   Will watch your condition.   Will get help right away if you are not doing well or get worse.     This information is not intended to replace advice given to you by your health care provider. Make sure you discuss any questions you have with your health care provider.     Document Released: 06/20/2008 Document Revised: 10/02/2014 Document Reviewed: 10/28/2012  Elsevier Interactive Patient Education 2017 [employer]

## 2016-11-10 ENCOUNTER — Telehealth: Payer: Self-pay | Admitting: Cardiology

## 2016-11-10 NOTE — Telephone Encounter (Signed)
TOC Phone Call. Appt is on 11/16/16 at 11:30am w/ [name] .Thanks

## 2016-11-10 NOTE — Telephone Encounter (Signed)
TOC call attempt #1  Discharged 11/08/16

## 2016-11-13 NOTE — Telephone Encounter (Signed)
Patient contacted regarding discharge from [facility] on 11/08/16.  Patient understands to follow up with provider [name] on 11/16/16 at 11:30a at [facility] location. Patient understands discharge instructions? Yes Patient understands medications and regiment? Yes Patient understands to bring all medications to this visit? Yes

## 2016-11-16 ENCOUNTER — Ambulatory Visit (INDEPENDENT_AMBULATORY_CARE_PROVIDER_SITE_OTHER): Payer: Medicaid Other | Admitting: Cardiology

## 2016-11-16 ENCOUNTER — Encounter: Payer: Self-pay | Admitting: Cardiology

## 2016-11-16 ENCOUNTER — Telehealth: Payer: Self-pay | Admitting: Cardiology

## 2016-11-16 DIAGNOSIS — E1165 Type 2 diabetes mellitus with hyperglycemia: Secondary | ICD-10-CM

## 2016-11-16 DIAGNOSIS — Z794 Long term (current) use of insulin: Secondary | ICD-10-CM

## 2016-11-16 DIAGNOSIS — I5021 Acute systolic (congestive) heart failure: Secondary | ICD-10-CM | POA: Diagnosis not present

## 2016-11-16 DIAGNOSIS — IMO0001 Reserved for inherently not codable concepts without codable children: Secondary | ICD-10-CM

## 2016-11-16 DIAGNOSIS — E118 Type 2 diabetes mellitus with unspecified complications: Secondary | ICD-10-CM

## 2016-11-16 DIAGNOSIS — O209 Hemorrhage in early pregnancy, unspecified: Secondary | ICD-10-CM

## 2016-11-16 DIAGNOSIS — R7989 Other specified abnormal findings of blood chemistry: Secondary | ICD-10-CM

## 2016-11-16 DIAGNOSIS — O24111 Pre-existing diabetes mellitus, type 2, in pregnancy, first trimester: Secondary | ICD-10-CM

## 2016-11-16 DIAGNOSIS — I420 Dilated cardiomyopathy: Secondary | ICD-10-CM

## 2016-11-16 DIAGNOSIS — O26899 Other specified pregnancy related conditions, unspecified trimester: Secondary | ICD-10-CM

## 2016-11-16 DIAGNOSIS — O021 Missed abortion: Secondary | ICD-10-CM

## 2016-11-16 DIAGNOSIS — N183 Chronic kidney disease, stage 3 unspecified: Secondary | ICD-10-CM

## 2016-11-16 DIAGNOSIS — R109 Unspecified abdominal pain: Secondary | ICD-10-CM

## 2016-11-16 DIAGNOSIS — I10 Essential (primary) hypertension: Secondary | ICD-10-CM

## 2016-11-16 DIAGNOSIS — O2341 Unspecified infection of urinary tract in pregnancy, first trimester: Secondary | ICD-10-CM

## 2016-11-16 MED ORDER — CARVEDILOL 6.25 MG PO TABS: 6.2500 mg | ORAL_TABLET | Freq: Two times a day (BID) | ORAL | 5 refills | Status: DC

## 2016-11-16 MED ORDER — FUROSEMIDE 40 MG PO TABS: 40.0000 mg | ORAL_TABLET | Freq: Every day | ORAL | 5 refills | Status: DC

## 2016-11-16 MED ORDER — ISOSORBIDE MONONITRATE ER 30 MG PO TB24: 15.0000 mg | ORAL_TABLET | Freq: Every day | ORAL | 5 refills | Status: DC

## 2016-11-16 MED ORDER — METFORMIN HCL 500 MG PO TABS: 500.0000 mg | ORAL_TABLET | Freq: Two times a day (BID) | ORAL | 0 refills | Status: DC

## 2016-11-16 MED ORDER — HYDRALAZINE HCL 10 MG PO TABS: 10.0000 mg | ORAL_TABLET | Freq: Three times a day (TID) | ORAL | 5 refills | Status: DC

## 2016-11-16 NOTE — Telephone Encounter (Signed)
New message    Pt states she was just here and didn't ask for refills of her medication. She states she does not need them now, but for in the future. She is requesting RN call her.

## 2016-11-16 NOTE — Progress Notes (Signed)
11/16/2016 Lisa Hodges   1986-09-09  [id number]  Primary Physician No PCP Per Patient Primary Cardiologist: Dr Percival Spanish  HPI:   Pleasant 31 y.o.mobidly obese AA female with a history of HTN, DM, (non compliant) and recent CAP in Jan 2018. She came back to the ED 10/26/16 with dyspnea. Her BNP was 867. Her CXR suggested worsening pneumonia but CHF was suspected as well. Echo showed cardiomyopathy with an EF of 30% (presumably NICM with global HK). She was hospitalized two weeks. She has diuresed 11.4L, 27lbs. Wgt at discharge was 185 lbs. She was not discharged on an ACE/ARB secondary to renal insufficiency. Her Coreg had to be cut back before discharge secondary to bradycardia.   She is in the office today for follow up. She looks great. She says she is being vigilant about her diet and taking her medications as directed. She denies any dyspnea. Her LE edema has resolved. Her wgt today is 182 lbs. She has instructions to call tomorrow to arrange a PCP.    Current Outpatient Prescriptions  Medication Sig Dispense Refill  . carvedilol (COREG) 6.25 MG tablet Take 1 tablet (6.25 mg total) by mouth 2 (two) times daily with a meal. 60 tablet 0  . furosemide (LASIX) 40 MG tablet Take 1 tablet (40 mg total) by mouth daily. 30 tablet 0  . glucose monitoring kit (FREESTYLE) monitoring kit 1 each by Does not apply route as needed for other. 1 each 0  . hydrALAZINE (APRESOLINE) 10 MG tablet Take 1 tablet (10 mg total) by mouth 3 (three) times daily. 90 tablet 0  . insulin aspart protamine- aspart (NOVOLOG MIX 70/30) (70-30) 100 UNIT/ML injection Inject 0.06 mLs (6 Units total) into the skin 2 (two) times daily with a meal. 3.6 mL 0  . Insulin Syringe-Needle U-100 (INSULIN SYRINGE .3CC/31GX5/16") 31G X 5/16" 0.3 ML MISC 1 applicator by Does not apply route 2 (two) times daily. 60 each 0  . isosorbide mononitrate (IMDUR) 30 MG 24 hr tablet Take 0.5 tablets (15 mg total) by mouth daily. 30 tablet 0  .  metFORMIN (GLUCOPHAGE) 500 MG tablet Take 1 tablet (500 mg total) by mouth 2 (two) times daily with a meal. 60 tablet 0   No current facility-administered medications for this visit.     No Known Allergies  Social History   Social History  . Marital status: Single    Spouse name: N/A  . Number of children: N/A  . Years of education: N/A   Occupational History  . Not on file.   Social History Main Topics  . Smoking status: Never Smoker  . Smokeless tobacco: Never Used  . Alcohol use No  . Drug use: No  . Sexual activity: Not on file   Other Topics Concern  . Not on file   Social History Narrative  . No narrative on file     Review of Systems: General: negative for chills, fever, night sweats or weight changes.  Cardiovascular: negative for chest pain, dyspnea on exertion, edema, orthopnea, palpitations, paroxysmal nocturnal dyspnea or shortness of breath Dermatological: negative for rash Respiratory: negative for cough or wheezing Urologic: negative for hematuria Abdominal: negative for nausea, vomiting, diarrhea, bright red blood per rectum, melena, or hematemesis Neurologic: negative for visual changes, syncope, or dizziness All other systems reviewed and are otherwise negative except as noted above.    Blood pressure 114/76, pulse 94, height _0  (1.448 m), weight 186 lb (84.4 kg), last menstrual period 10/23/2016.  General appearance: alert, cooperative, no distress and morbidly obese Neck: no carotid bruit and no JVD Lungs: clear to auscultation bilaterally Heart: regular rate and rhythm Extremities: trace edema Skin: Skin color, texture, turgor normal. No rashes or lesions Neurologic: Grossly normal   ASSESSMENT AND PLAN:   Acute systolic congestive heart failure ([place]) Pt hospitalized  10/26/16-11/08/16 with CHF. She diuresed 27 lbs. 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 She should see Dr Percival Spanish or myself in 6 weeks. She knows to contact us if her wgt goes up > 5 lbs in a week.   Kerin Ransom PA-C 11/16/2016 11:23 AM

## 2016-11-16 NOTE — Assessment & Plan Note (Signed)
Pt hospitalized  10/26/16-11/08/16 with CHF. She diuresed 27 lbs. Discharge wgt 185

## 2016-11-16 NOTE — Assessment & Plan Note (Signed)
BMI 40 

## 2016-11-16 NOTE — Assessment & Plan Note (Signed)
Controlled.  

## 2016-11-16 NOTE — Telephone Encounter (Signed)
Called patient. She states she does not have refills for her medications. Rx(s) sent to pharmacy electronically to [facility] per patient request (she states she has Medicaid now and can use this pharmacy). Advised that her future metformin refills be obtained from PCP. Insulin ordered on 11/08/16 by another provider per [facility]

## 2016-11-16 NOTE — Assessment & Plan Note (Signed)
IDDM with CHF, CM, and CRI

## 2016-11-16 NOTE — Assessment & Plan Note (Signed)
SCr 1.8-2.0

## 2016-11-16 NOTE — Patient Instructions (Signed)
Medication Instructions:  Your physician recommends that you continue on your current medications as directed. Please refer to the Current Medication list given to you today.  If you need a refill on your cardiac medications before your next appointment, please call your pharmacy.  Follow-Up: Your physician recommends that you schedule a follow-up appointment in: 6 WEEKS WITH DR [facility] FOR [facility] IF UNAVAILABLE    Thank you for choosing [facility] at [facility]!!    Virgina Evener, LPN

## 2016-11-16 NOTE — Assessment & Plan Note (Signed)
EF 30-35% by echo Feb 2018 

## 2016-11-22 ENCOUNTER — Ambulatory Visit: Payer: Medicaid Other | Attending: Internal Medicine | Admitting: Internal Medicine

## 2016-11-22 VITALS — BP 136/82 | HR 85 | Temp 98.2°F | Resp 20 | Ht <= 58 in | Wt 187.4 lb

## 2016-11-22 DIAGNOSIS — I420 Dilated cardiomyopathy: Secondary | ICD-10-CM | POA: Diagnosis not present

## 2016-11-22 DIAGNOSIS — Z794 Long term (current) use of insulin: Secondary | ICD-10-CM | POA: Diagnosis not present

## 2016-11-22 DIAGNOSIS — Z6841 Body Mass Index (BMI) 40.0 and over, adult: Secondary | ICD-10-CM | POA: Diagnosis not present

## 2016-11-22 DIAGNOSIS — E119 Type 2 diabetes mellitus without complications: Secondary | ICD-10-CM | POA: Insufficient documentation

## 2016-11-22 DIAGNOSIS — I10 Essential (primary) hypertension: Secondary | ICD-10-CM | POA: Diagnosis not present

## 2016-11-22 DIAGNOSIS — E118 Type 2 diabetes mellitus with unspecified complications: Secondary | ICD-10-CM

## 2016-11-22 DIAGNOSIS — I429 Cardiomyopathy, unspecified: Secondary | ICD-10-CM | POA: Diagnosis present

## 2016-11-22 DIAGNOSIS — IMO0001 Reserved for inherently not codable concepts without codable children: Secondary | ICD-10-CM

## 2016-11-22 LAB — GLUCOSE, POCT (MANUAL RESULT ENTRY): POC Glucose: 140 mg/dl — AB (ref 70–99)

## 2016-11-22 MED ORDER — "INSULIN SYRINGE 31G X 5/16"" 0.3 ML MISC": 1.0000 | Freq: Two times a day (BID) | 0 refills | Status: AC

## 2016-11-22 MED ORDER — INSULIN ASPART PROT & ASPART (70-30 MIX) 100 UNIT/ML ~~LOC~~ SUSP: 8.0000 [IU] | Freq: Two times a day (BID) | SUBCUTANEOUS | 0 refills | Status: DC

## 2016-11-22 NOTE — Assessment & Plan Note (Signed)
Weight up 2# and slight increase LE edema - Reviewed need for low Na diet in addition to card modified Will send rx refills to her new preferred pharmacy [facility] Pt will call cardiology if weight up >5#s as instructed previously

## 2016-11-22 NOTE — Assessment & Plan Note (Signed)
Wt Readings from Last 3 Encounters:  11/22/16 187 lb 6.4 oz (85 kg)  11/16/16 186 lb (84.4 kg)  11/08/16 185 lb 3.2 oz (84 kg)   The patient is asked to make an attempt to improve diet and exercise patterns to aid in medical management of this problem.

## 2016-11-22 NOTE — Assessment & Plan Note (Signed)
BP Readings from Last 3 Encounters:  11/22/16 136/82  11/16/16 114/76  11/08/16 122/82   Much improved, compliant with meds as rx'd The current medical regimen is effective;  continue present plan and medications.

## 2016-11-22 NOTE — Assessment & Plan Note (Signed)
Still hyperglycemic at home (in 200s) but normal cbg here (140) Increase 70/30 to 8 Units BID and call if cbg >150 routinely No longer on metformin as PTA and taking insulin as rx'd during hosp for HF in 10/2016 Check a1c next OV Lab Results  Component Value Date   HGBA1C 10.1 (H) 10/30/2016

## 2016-11-22 NOTE — Progress Notes (Signed)
Subjective:    Patient ID: Lisa Hodges, female    DOB: 1986-06-12, 31 y.o.   MRN: [id number]  HPI  Patient here for HFU of acute syst HF in early Feb 2018 due to NICM (presumed dx). Has taken all rx as rx'd. HFU with cardiology last week reviewed. Notes slight increase LE swelling "despite low fat food and taking all my medicines" - cbs 150-220 at home since DC, taking 70/30 BID as rx'd, no hypoglycemia events or symptoms   Past Medical History:  Diagnosis Date  . Diabetes mellitus without complication (HCC)   . Hypertension     Review of Systems  Constitutional: Positive for fatigue. Negative for unexpected weight change.  Respiratory: Negative for cough and shortness of breath.   Cardiovascular: Positive for leg swelling (very mild, but wearing compression socks as rx'd). Negative for chest pain.       Objective:    Physical Exam  Constitutional: She appears well-developed and well-nourished. No distress.  obese  Cardiovascular: Normal rate, regular rhythm and normal heart sounds.   No murmur heard. Pulmonary/Chest: Effort normal and breath sounds normal. No respiratory distress.  Musculoskeletal: She exhibits edema (trace to 1+ BLE at shin).  Vitals reviewed.   BP 136/82 (BP Location: Right Arm, Patient Position: Sitting, Cuff Size: Normal)   Pulse 85   Temp 98.2 F (36.8 C) (Oral)   Resp 20   Ht 4\' 9"  (1.448 m)   Wt 187 lb 6.4 oz (85 kg)   LMP 11/14/2016 (Approximate)   BMI 40.55 kg/m  Wt Readings from Last 3 Encounters:  11/22/16 187 lb 6.4 oz (85 kg)  11/16/16 186 lb (84.4 kg)  11/08/16 185 lb 3.2 oz (84 kg)     Lab Results  Component Value Date   WBC 5.9 11/03/2016   HGB 8.5 (L) 11/03/2016   HCT 28.8 (L) 11/03/2016   PLT 408 (H) 11/03/2016   GLUCOSE 257 (H) 11/08/2016   ALT 29 10/28/2016   AST 35 10/28/2016   NA 134 (L) 11/08/2016   K 4.3 11/08/2016   CL 98 (L) 11/08/2016   CREATININE 1.89 (H) 11/08/2016   BUN 33 (H) 11/08/2016   CO2 26  11/08/2016   TSH 4.157 10/26/2016   HGBA1C 10.1 (H) 10/30/2016    Dg Chest 2 View  Result Date: 10/26/2016 CLINICAL DATA:  Follow-up pneumonia. Shortness of breath for 2 days, worsening today. Hypertensive. History diabetes. EXAM: CHEST  2 VIEW COMPARISON:  Chest radiograph October 12, 2016 FINDINGS: Worsening multifocal consolidation. Small LEFT pleural effusion. Mild cardiomegaly even with consideration to this low inspiratory portable examination with crowded vascular markings. No pneumothorax. Soft tissue planes and included osseous structures are nonsuspicious. IMPRESSION: Worsening multifocal pneumonia.  Small pleural effusion. Stable cardiomegaly. Electronically Signed   By: Awilda Metro M.D.   On: 10/26/2016 03:20       Assessment & Plan:   Problem List Items Addressed This Visit    Cardiomyopathy-etiology not determined but suspect NICM    Weight up 2# and slight increase LE edema - Reviewed need for low Na diet in addition to card modified Will send rx refills to her new preferred pharmacy [facility] Pt will call cardiology if weight up >5#s as instructed previously      Hypertension    BP Readings from Last 3 Encounters:  11/22/16 136/82  11/16/16 114/76  11/08/16 122/82   Much improved, compliant with meds as rx'd The current medical regimen is effective;  continue present plan  and medications.       Insulin dependent diabetes mellitus with complications (HCC) - Primary    Still hyperglycemic at home (in 200s) but normal cbg here (140) Increase 70/30 to 8 Units BID and call if cbg >150 routinely No longer on metformin as PTA and taking insulin as rx'd during hosp for HF in 10/2016 Check a1c next OV Lab Results  Component Value Date   HGBA1C 10.1 (H) 10/30/2016         Relevant Medications   insulin aspart protamine- aspart (NOVOLOG MIX 70/30) (70-30) 100 UNIT/ML injection   Other Relevant Orders   Glucose (CBG) (Completed)   Morbid obesity (HCC)    Wt  Readings from Last 3 Encounters:  11/22/16 187 lb 6.4 oz (85 kg)  11/16/16 186 lb (84.4 kg)  11/08/16 185 lb 3.2 oz (84 kg)   The patient is asked to make an attempt to improve diet and exercise patterns to aid in medical management of this problem.       Relevant Medications   insulin aspart protamine- aspart (NOVOLOG MIX 70/30) (70-30) 100 UNIT/ML injection       Rene Paci, MD

## 2016-11-22 NOTE — Patient Instructions (Addendum)
It was good to see you today.  We have reviewed your prior records including labs and tests today  Medications reviewed and updated, no changes recommended at this time. Refill on medication(s) as discussed today - sent to [facility]  Keep follow up with cardiology as planned and call cardiology if your wight goes up more than 5 pounds!  Please schedule followup here in 6-8 weeks for recheck of diabetes mellitus and blood pressure  - can call sooner if sugars running over 150!    Low-Sodium Eating Plan Sodium, which is an element that makes up salt, helps you maintain a healthy balance of fluids in your body. Too much sodium can increase your blood pressure and cause fluid and waste to be held in your body. Your health care provider or dietitian may recommend following this plan if you have high blood pressure (hypertension), kidney disease, liver disease, or heart failure. Eating less sodium can help lower your blood pressure, reduce swelling, and protect your heart, liver, and kidneys. What are tips for following this plan? General guidelines   Most people on this plan should limit their sodium intake to 1,500-2,000 mg (milligrams) of sodium each day. Reading food labels   The Nutrition Facts label lists the amount of sodium in one serving of the food. If you eat more than one serving, you must multiply the listed amount of sodium by the number of servings.  Choose foods with less than 140 mg of sodium per serving.  Avoid foods with 300 mg of sodium or more per serving. Shopping   Look for lower-sodium products, often labeled as "low-sodium" or "no salt added."  Always check the sodium content even if foods are labeled as "unsalted" or "no salt added".  Buy fresh foods.  Avoid canned foods and premade or frozen meals.  Avoid canned, cured, or processed meats  Buy breads that have less than 80 mg of sodium per slice. Cooking   Eat more home-cooked food and less restaurant,  buffet, and fast food.  Avoid adding salt when cooking. Use salt-free seasonings or herbs instead of table salt or sea salt. Check with your health care provider or pharmacist before using salt substitutes.  Cook with plant-based oils, such as canola, sunflower, or olive oil. Meal planning   When eating at a restaurant, ask that your food be prepared with less salt or no salt, if possible.  Avoid foods that contain MSG (monosodium glutamate). MSG is sometimes added to [place] food, bouillon, and some canned foods. What foods are recommended? The items listed may not be a complete list. Talk with your dietitian about what dietary choices are best for you. Grains  Low-sodium cereals, including oats, puffed wheat and rice, and shredded wheat. Low-sodium crackers. Unsalted rice. Unsalted pasta. Low-sodium bread. Whole-grain breads and whole-grain pasta. Vegetables  Fresh or frozen vegetables. "No salt added" canned vegetables. "No salt added" tomato sauce and paste. Low-sodium or reduced-sodium tomato and vegetable juice. Fruits  Fresh, frozen, or canned fruit. Fruit juice. Meats and other protein foods  Fresh or frozen (no salt added) meat, poultry, seafood, and fish. Low-sodium canned tuna and salmon. Unsalted nuts. Dried peas, beans, and lentils without added salt. Unsalted canned beans. Eggs. Unsalted nut butters. Dairy  Milk. Soy milk. Cheese that is naturally low in sodium, such as ricotta cheese, fresh mozzarella, or Swiss cheese Low-sodium or reduced-sodium cheese. Cream cheese. Yogurt. Fats and oils  Unsalted butter. Unsalted margarine with no trans fat. Vegetable oils such as  canola or olive oils. Seasonings and other foods  Fresh and dried herbs and spices. Salt-free seasonings. Low-sodium mustard and ketchup. Sodium-free salad dressing. Sodium-free light mayonnaise. Fresh or refrigerated horseradish. Lemon juice. Vinegar. Homemade, reduced-sodium, or low-sodium soups. Unsalted  popcorn and pretzels. Low-salt or salt-free chips. What foods are not recommended? The items listed may not be a complete list. Talk with your dietitian about what dietary choices are best for you. Grains  Instant hot cereals. Bread stuffing, pancake, and biscuit mixes. Croutons. Seasoned rice or pasta mixes. Noodle soup cups. Boxed or frozen macaroni and cheese. Regular salted crackers. Self-rising flour. Vegetables  Sauerkraut, pickled vegetables, and relishes. Olives. [place] fries. Onion rings. Regular canned vegetables (not low-sodium or reduced-sodium). Regular canned tomato sauce and paste (not low-sodium or reduced-sodium). Regular tomato and vegetable juice (not low-sodium or reduced-sodium). Frozen vegetables in sauces. Meats and other protein foods  Meat or fish that is salted, canned, smoked, spiced, or pickled. Bacon, ham, sausage, hotdogs, corned beef, chipped beef, packaged lunch meats, salt pork, jerky, pickled herring, anchovies, regular canned tuna, sardines, salted nuts. Dairy  Processed cheese and cheese spreads. Cheese curds. Blue cheese. Feta cheese. String cheese. Regular cottage cheese. Buttermilk. Canned milk. Fats and oils  Salted butter. Regular margarine. Ghee. Bacon fat. Seasonings and other foods  Onion salt, garlic salt, seasoned salt, table salt, and sea salt. Canned and packaged gravies. Worcestershire sauce. Tartar sauce. Barbecue sauce. Teriyaki sauce. Soy sauce, including reduced-sodium. Steak sauce. Fish sauce. Oyster sauce. Cocktail sauce. Horseradish that you find on the shelf. Regular ketchup and mustard. Meat flavorings and tenderizers. Bouillon cubes. Hot sauce and Tabasco sauce. Premade or packaged marinades. Premade or packaged taco seasonings. Relishes. Regular salad dressings. Salsa. Potato and tortilla chips. Corn chips and puffs. Salted popcorn and pretzels. Canned or dried soups. Pizza. Frozen entrees and pot pies. Summary  Eating less sodium can help  lower your blood pressure, reduce swelling, and protect your heart, liver, and kidneys.  Most people on this plan should limit their sodium intake to 1,500-2,000 mg (milligrams) of sodium each day.  Canned, boxed, and frozen foods are high in sodium. Restaurant foods, fast foods, and pizza are also very high in sodium. You also get sodium by adding salt to food.  Try to cook at home, eat more fresh fruits and vegetables, and eat less fast food, canned, processed, or prepared foods. This information is not intended to replace advice given to you by your health care provider. Make sure you discuss any questions you have with your health care provider. Document Released: 03/03/2002 Document Revised: 09/04/2016 Document Reviewed: 09/04/2016 Elsevier Interactive Patient Education  2017 [employer].

## 2016-12-04 ENCOUNTER — Telehealth: Payer: Self-pay | Admitting: Family Medicine

## 2016-12-04 MED ORDER — ACCU-CHEK AVIVA PLUS W/DEVICE KIT: PACK | 0 refills | Status: DC

## 2016-12-04 MED ORDER — ACCU-CHEK SOFTCLIX LANCETS MISC: 12 refills | Status: DC

## 2016-12-04 MED ORDER — GLUCOSE BLOOD VI STRP: ORAL_STRIP | 12 refills | Status: DC

## 2016-12-04 MED ORDER — ACCU-CHEK SOFTCLIX LANCET DEV KIT: PACK | 0 refills | Status: DC

## 2016-12-04 NOTE — Telephone Encounter (Signed)
Patient with Medicaid - Accu-chek supplies sent to requested pharmacy

## 2016-12-04 NOTE — Telephone Encounter (Signed)
Pt is calling to request a diabetes testing kit be called in to the [facility] on [address]. States that she has diabetes and is need of a testing kit with the meter and lancets. Pt has appt scheduled for 01/10/17 with PA [facility]. Please f/u.

## 2016-12-31 ENCOUNTER — Other Ambulatory Visit: Payer: Self-pay | Admitting: Internal Medicine

## 2016-12-31 NOTE — Progress Notes (Deleted)
Cardiology Office Note   Date:  12/31/2016   ID:  Lisa Hodges, DOB 12-12-1985, MRN [id number]  PCP:  No PCP Per Patient  Cardiologist:   Minus Breeding, MD  Referring:  ***  No chief complaint on file.     History of Present Illness: Lisa Hodges is a 31 y.o. female who presents for follow up of cardiomyopathy with an EF of 30% (presumably NICM with global HK).   She was last seen by Lisa Hodges in Feb.  She was doing well at that time and seemed to be euvolemic.  Of note she has not yet had a work up of her cardiomyopathy.  ***  She was hospitalized two weeks. She has diuresed 11.4L, 27lbs. Wgt at discharge was 185 lbs. She was not discharged on an ACE/ARB secondary to renal insufficiency. Her Coreg had to be cut back before discharge secondary to bradycardia.   She is in the office today for follow up. She looks great. She says she is being vigilant about her diet and taking her medications as directed. She denies any dyspnea. Her LE edema has resolved. Her wgt today is 182 lbs. She has instructions to call tomorrow to arrange a PCP.   Past Medical History:  Diagnosis Date  . Diabetes mellitus without complication ([place])   . Hypertension     Past Surgical History:  Procedure Laterality Date  . CESAREAN SECTION       Current Outpatient Prescriptions  Medication Sig Dispense Refill  . ACCU-CHEK SOFTCLIX LANCETS lancets Use as instructed 100 each 12  . Blood Glucose Monitoring Suppl (ACCU-CHEK AVIVA PLUS) w/Device KIT Use as directed 1 kit 0  . carvedilol (COREG) 6.25 MG tablet Take 1 tablet (6.25 mg total) by mouth 2 (two) times daily with a meal. 60 tablet 5  . furosemide (LASIX) 40 MG tablet Take 1 tablet (40 mg total) by mouth daily. 30 tablet 5  . glucose blood (ACCU-CHEK AVIVA PLUS) test strip Use as instructed 100 each 12  . hydrALAZINE (APRESOLINE) 10 MG tablet Take 1 tablet (10 mg total) by mouth 3 (three) times daily. 90 tablet 5  . insulin aspart protamine-  aspart (NOVOLOG MIX 70/30) (70-30) 100 UNIT/ML injection Inject 0.08 mLs (8 Units total) into the skin 2 (two) times daily with a meal. 4.8 mL 0  . isosorbide mononitrate (IMDUR) 30 MG 24 hr tablet Take 0.5 tablets (15 mg total) by mouth daily. 15 tablet 5  . Lancets Misc. (ACCU-CHEK SOFTCLIX LANCET DEV) KIT Use as directed 1 kit 0   No current facility-administered medications for this visit.     Allergies:   Patient has no known allergies.     ROS:  Please see the history of present illness.   Otherwise, review of systems are positive for {NONE DEFAULTED:18576::"none"}.   All other systems are reviewed and negative.    PHYSICAL EXAM: VS:  There were no vitals taken for this visit. , BMI There is no height or weight on file to calculate BMI. GENERAL:  Well appearing NECK:  No jugular venous distention, waveform within normal limits, carotid upstroke brisk and symmetric, no bruits, no thyromegaly LUNGS:  Clear to auscultation bilaterally BACK:  No CVA tenderness CHEST:  Unremarkable HEART:  PMI not displaced or sustained,S1 and S2 within normal limits, no S3, no S4, no clicks, no rubs, *** murmurs ABD:  Flat, positive bowel sounds normal in frequency in pitch, no bruits, no rebound, no guarding, no midline pulsatile mass,  no hepatomegaly, no splenomegaly EXT:  2 plus pulses throughout, no edema, no cyanosis no clubbing SKIN:  No rashes no nodules   EKG:  EKG {ACTION; IS/IS WFU:[id number]} ordered today. The ekg ordered today demonstrates ***   Recent Labs: 10/26/2016: TSH 4.157 10/28/2016: ALT 29; B Natriuretic Peptide 523.8 11/03/2016: Hemoglobin 8.5; Platelets 408 11/08/2016: BUN 33; Creatinine, Ser 1.89; Potassium 4.3; Sodium 134    Lipid Panel No results found for: CHOL, TRIG, HDL, CHOLHDL, VLDL, LDLCALC, LDLDIRECT    Wt Readings from Last 3 Encounters:  11/22/16 187 lb 6.4 oz (85 kg)  11/16/16 186 lb (84.4 kg)  11/08/16 185 lb 3.2 oz (84 kg)      Other studies  Reviewed: Additional studies/ records that were reviewed today include: ***. Review of the above records demonstrates:  Please see elsewhere in the note.  ***   ASSESSMENT AND PLAN:  *** Acute systolic congestive heart failure ([place]) Pt hospitalized  10/26/16-11/08/16 with CHF. She diuresed 27 lbs. 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  The patient {ACTIONS; HAS/DOES NOT HAVE:19233} concerns regarding medicines.  The following changes have been made:  {PLAN; NO CHANGE:13088:s}  Labs/ tests ordered today include: *** No orders of the defined types were placed in this encounter.    Disposition:   FU with ***    Signed, Minus Breeding, MD  12/31/2016 8:16 PM    [facility]

## 2017-01-01 ENCOUNTER — Ambulatory Visit: Payer: Medicaid Other | Admitting: Cardiology

## 2017-01-10 ENCOUNTER — Ambulatory Visit: Payer: Medicaid Other | Admitting: Family Medicine

## 2017-01-10 ENCOUNTER — Telehealth: Payer: Self-pay | Admitting: General Practice

## 2017-01-10 MED ORDER — "INSULIN SYRINGE-NEEDLE U-100 31G X 5/16"" 0.5 ML MISC": 0 refills | Status: DC

## 2017-01-10 NOTE — Addendum Note (Signed)
Addended by: Santa Lighter on: 01/10/2017 12:18 PM   Modules accepted: Orders

## 2017-01-10 NOTE — Telephone Encounter (Signed)
Syringes refilled.
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
## 2017-01-28 NOTE — Progress Notes (Addendum)
Cardiology Office Note   Date:  01/29/2017   ID:  Lisa Hodges, DOB [birth year], MRN [id number]  PCP:  Patient, No Pcp Per  Cardiologist:   Minus Breeding, MD    CC:  Cardiomyopathy    History of Present Illness: Lisa Hodges is a 31 y.o. female who presents for follow up of cardiomyopathy with an EF of 30% (presumably NICM with global HK).   She was last seen by Kerin Ransom in Feb.  She was doing well at that time and seemed to be euvolemic.  Of note she has not yet had a work up of her cardiomyopathy.    She has been working very hard watching her salt, weighing herself daily, understanding and taking her medications. She takes care of her disabled daughter. She doesn't drive and so her transportation is limited. She is really doing a great job. She does get some shortness of breath with activity but this is better than when she was admitted. She still has lower extremity swelling in her weights up a couple of pounds although typically fluctuates around the 185 which was her discharge weight. The patient denies any new symptoms such as chest discomfort, neck or arm discomfort. There has been no new shortness of breath, PND or orthopnea. There have been no reported palpitations, presyncope or syncope.  Past Medical History:  Diagnosis Date  . Diabetes mellitus without complication ([place])   . Hypertension     Past Surgical History:  Procedure Laterality Date  . CESAREAN SECTION       Current Outpatient Prescriptions  Medication Sig Dispense Refill  . ACCU-CHEK SOFTCLIX LANCETS lancets Use as instructed 100 each 12  . Blood Glucose Monitoring Suppl (ACCU-CHEK AVIVA PLUS) w/Device KIT Use as directed 1 kit 0  . carvedilol (COREG) 6.25 MG tablet Take 1 tablet (6.25 mg total) by mouth 2 (two) times daily with a meal. 60 tablet 5  . furosemide (LASIX) 40 MG tablet Take 60 mg in morning and 40 mg in the evening 45 tablet 11  . glucose blood (ACCU-CHEK AVIVA PLUS) test strip Use as  instructed 100 each 12  . hydrALAZINE (APRESOLINE) 10 MG tablet Take 1 tablet (10 mg total) by mouth 3 (three) times daily. 90 tablet 5  . Insulin Syringe-Needle U-100 (BD INSULIN SYRINGE ULTRAFINE) 31G X 5/16" 0.5 ML MISC Use as directed 100 each 0  . isosorbide mononitrate (IMDUR) 30 MG 24 hr tablet Take 1 tablet (30 mg total) by mouth daily. 30 tablet 11  . Lancets Misc. (ACCU-CHEK SOFTCLIX LANCET DEV) KIT Use as directed 1 kit 0  . insulin aspart protamine- aspart (NOVOLOG MIX 70/30) (70-30) 100 UNIT/ML injection Inject 0.08 mLs (8 Units total) into the skin 2 (two) times daily with a meal. 4.8 mL 0   No current facility-administered medications for this visit.     Allergies:   Patient has no known allergies.     ROS:  Please see the history of present illness.   Otherwise, review of systems are positive for none.   All other systems are reviewed and negative.    PHYSICAL EXAM: VS:  BP (!) 146/90 (BP Location: Left Arm, Patient Position: Sitting, Cuff Size: Normal)   Pulse 96   Ht 4' 9"  (1.448 m)   Wt 191 lb 6.4 oz (86.8 kg)   BMI 41.42 kg/m  , BMI Body mass index is 41.42 kg/m. GENERAL:  Well appearing NECK:  Positive jugular venous distention, waveform within normal limits,  carotid upstroke brisk and symmetric, no bruits, no thyromegaly LUNGS:  Clear to auscultation bilaterally BACK:  No CVA tenderness CHEST:  Unremarkable HEART:  PMI not displaced or sustained,S1 and S2 within normal limits, no S3, no S4, no clicks, no rubs, no murmurs ABD:  Flat, positive bowel sounds normal in frequency in pitch, no bruits, no rebound, no guarding, no midline pulsatile mass, no hepatomegaly, no splenomegaly EXT:  2 plus pulses throughout, moderate calf edema, no cyanosis no clubbing SKIN:  No rashes no nodules   EKG:  EKG is not ordered today.    Recent Labs: 10/26/2016: TSH 4.157 10/28/2016: ALT 29; B Natriuretic Peptide 523.8 11/03/2016: Hemoglobin 8.5; Platelets 408 11/08/2016: BUN  33; Creatinine, Ser 1.89; Potassium 4.3; Sodium 134    Lipid Panel No results found for: CHOL, TRIG, HDL, CHOLHDL, VLDL, LDLCALC, LDLDIRECT    Wt Readings from Last 3 Encounters:  01/29/17 191 lb 6.4 oz (86.8 kg)  11/22/16 187 lb 6.4 oz (85 kg)  11/16/16 186 lb (84.4 kg)      Other studies Reviewed: Additional studies/ records that were reviewed today include: Labs. Review of the above records demonstrates:  Please see elsewhere in the note.     ASSESSMENT AND PLAN:   Acute systolic congestive heart failure ([place]) I am going to slowly titrate meds.  She had hypotension with symptoms in the hospital.  Today I will increase the Imdur to 30 mg.  Next we might go up on the hydralazine.  She has continued edema and I will increase her Lasix to 60 mg daily and she will get a BMET in 10 days.    Cardiomyopathy-etiology not determined but suspect NICM EF 30-35% by echo Feb 2018.  She will get a [employer].  Chronic renal insufficiency, stage III (moderate) SCr 1.8-2.0.  This will be checked again in 10 days.   Hypertension This is being managed in the context of treating his CHF  Insulin dependent diabetes mellitus with complications [facility]) She had an appt to have this treated and is going to get a new primary MD.  When she returns we need to make sure that she has a primary MD.   Morbid obesity ([place]) We talked about diet and exercise.     Current medicines are reviewed at length with the patient today.  The patient does not have concerns regarding medicines.  The following changes have been made:  As above  Labs/ tests ordered today include:   Orders Placed This Encounter  Procedures  . Basic Metabolic Panel (BMET)  . Myocardial Perfusion Imaging     Disposition:   FU with Kerin Ransom PAc in one month.     Signed, Minus Breeding, MD  01/29/2017 12:31 PM    [facility]

## 2017-01-29 ENCOUNTER — Ambulatory Visit (INDEPENDENT_AMBULATORY_CARE_PROVIDER_SITE_OTHER): Payer: Medicaid Other | Admitting: Cardiology

## 2017-01-29 ENCOUNTER — Encounter: Payer: Self-pay | Admitting: Cardiology

## 2017-01-29 VITALS — BP 146/90 | HR 96 | Ht <= 58 in | Wt 191.4 lb

## 2017-01-29 DIAGNOSIS — I5021 Acute systolic (congestive) heart failure: Secondary | ICD-10-CM | POA: Diagnosis not present

## 2017-01-29 DIAGNOSIS — N183 Chronic kidney disease, stage 3 unspecified: Secondary | ICD-10-CM

## 2017-01-29 DIAGNOSIS — I10 Essential (primary) hypertension: Secondary | ICD-10-CM

## 2017-01-29 DIAGNOSIS — E118 Type 2 diabetes mellitus with unspecified complications: Secondary | ICD-10-CM | POA: Diagnosis not present

## 2017-01-29 DIAGNOSIS — Z794 Long term (current) use of insulin: Secondary | ICD-10-CM

## 2017-01-29 DIAGNOSIS — Z79899 Other long term (current) drug therapy: Secondary | ICD-10-CM | POA: Diagnosis not present

## 2017-01-29 MED ORDER — ISOSORBIDE MONONITRATE ER 30 MG PO TB24: 30.0000 mg | ORAL_TABLET | Freq: Every day | ORAL | 11 refills | Status: DC

## 2017-01-29 MED ORDER — FUROSEMIDE 40 MG PO TABS: ORAL_TABLET | ORAL | 11 refills | Status: DC

## 2017-01-29 NOTE — Patient Instructions (Signed)
Medication Instructions:  INCREASE- Isosorbide 30 mg daily TAKE- Lasix 60 mg(1 1/2 tablets) in the morning and 40 mg (1 tablets) in the evening  Labwork: BMP in 10 days  Testing/Procedures: Your physician has requested that you have a lexiscan myoview. For further information please visit [email or web address] Please follow instruction sheet, as given.  Follow-Up: Your physician recommends that you schedule a follow-up appointment in: 1 Month with [name]   Any Other Special Instructions Will Be Listed Below (If Applicable).   If you need a refill on your cardiac medications before your next appointment, please call your pharmacy.

## 2017-02-08 ENCOUNTER — Telehealth (HOSPITAL_COMMUNITY): Payer: Self-pay

## 2017-02-08 NOTE — Telephone Encounter (Signed)
Encounter complete. 

## 2017-02-12 ENCOUNTER — Telehealth: Payer: Self-pay | Admitting: Cardiology

## 2017-02-12 NOTE — Telephone Encounter (Signed)
New message    *STAT* If patient is at the pharmacy, call can be transferred to refill team.   1. Which medications need to be refilled? (please list name of each medication and dose if known) isosorbide mononitrate (IMDUR) 30 MG 24 hr tablet, furosemide (LASIX) 40 MG tablet   2. Which pharmacy/location (including street and city if local pharmacy) is medication to be sent to? [facility] [address]  3. Do they need a 30 day or 90 day supply? 30 day supply   Pt states that Dr. Antoine Poche told her that changes had been made to these medications and to pick up her new medications with the changes. Pt states she went to pick up the refills with the changes and no refills were sent

## 2017-02-13 ENCOUNTER — Ambulatory Visit (HOSPITAL_COMMUNITY)
Admission: RE | Admit: 2017-02-13 | Discharge: 2017-02-13 | Disposition: A | Discharge status: Home / Self Care | Payer: Medicaid Other | Source: Ambulatory Visit | Attending: Cardiology | Admitting: Cardiology

## 2017-02-13 DIAGNOSIS — I130 Hypertensive heart and chronic kidney disease with heart failure and stage 1 through stage 4 chronic kidney disease, or unspecified chronic kidney disease: Secondary | ICD-10-CM | POA: Insufficient documentation

## 2017-02-13 DIAGNOSIS — I420 Dilated cardiomyopathy: Secondary | ICD-10-CM | POA: Insufficient documentation

## 2017-02-13 DIAGNOSIS — I5021 Acute systolic (congestive) heart failure: Secondary | ICD-10-CM | POA: Diagnosis not present

## 2017-02-13 DIAGNOSIS — N183 Chronic kidney disease, stage 3 (moderate): Secondary | ICD-10-CM | POA: Insufficient documentation

## 2017-02-13 DIAGNOSIS — E669 Obesity, unspecified: Secondary | ICD-10-CM | POA: Diagnosis not present

## 2017-02-13 LAB — MYOCARDIAL PERFUSION IMAGING
CHL CUP NUCLEAR SDS: 8
CHL CUP NUCLEAR SRS: 1
CHL CUP NUCLEAR SSS: 9
LVDIAVOL: 104 mL (ref 46–106)
LVSYSVOL: 62 mL
NUC STRESS TID: 1.09
Peak HR: 112 {beats}/min
Rest HR: 85 {beats}/min

## 2017-02-13 MED ORDER — TECHNETIUM TC 99M TETROFOSMIN IV KIT
10.6000 | PACK | Freq: Once | INTRAVENOUS | Status: AC | PRN
  Administered 2017-02-13: 10.6 via INTRAVENOUS
  Filled 2017-02-13: qty 11

## 2017-02-13 MED ORDER — TECHNETIUM TC 99M TETROFOSMIN IV KIT
32.1000 | PACK | Freq: Once | INTRAVENOUS | Status: AC | PRN
  Administered 2017-02-13: 32.1 via INTRAVENOUS
  Filled 2017-02-13: qty 33

## 2017-02-13 MED ORDER — REGADENOSON 0.4 MG/5ML IV SOLN
0.4000 mg | Freq: Once | INTRAVENOUS | Status: AC
  Administered 2017-02-13: 0.4 mg via INTRAVENOUS

## 2017-02-27 ENCOUNTER — Ambulatory Visit: Payer: Medicaid Other | Admitting: Cardiology

## 2017-03-08 ENCOUNTER — Other Ambulatory Visit: Payer: Self-pay | Admitting: Internal Medicine

## 2017-03-09 ENCOUNTER — Ambulatory Visit: Payer: Medicaid Other | Admitting: Internal Medicine

## 2017-03-09 ENCOUNTER — Ambulatory Visit: Payer: Medicaid Other | Admitting: Cardiology

## 2017-03-26 ENCOUNTER — Encounter: Payer: Self-pay | Admitting: *Deleted

## 2017-03-26 ENCOUNTER — Ambulatory Visit: Payer: Medicaid Other | Admitting: Cardiology

## 2017-03-26 ENCOUNTER — Ambulatory Visit: Payer: Medicaid Other | Admitting: Internal Medicine

## 2017-04-06 ENCOUNTER — Other Ambulatory Visit: Payer: Self-pay | Admitting: Internal Medicine

## 2017-05-25 ENCOUNTER — Other Ambulatory Visit: Payer: Self-pay | Admitting: Internal Medicine

## 2017-06-01 ENCOUNTER — Telehealth: Payer: Self-pay | Admitting: General Practice

## 2017-06-01 MED ORDER — "INSULIN SYRINGE 31G X 5/16"" 0.5 ML MISC": 0 refills | Status: DC

## 2017-06-01 NOTE — Telephone Encounter (Signed)
Refilled - patient must keep office visit for further refills.

## 2017-06-01 NOTE — Telephone Encounter (Signed)
Pt. Called requesting a refill on her needles for her insulin. Pt. Has scheduled an appt. For 10/4 and would like Rx sent to [facility] pharmacy on [address]. Please f/u

## 2017-06-11 ENCOUNTER — Telehealth: Payer: Self-pay | Admitting: Cardiology

## 2017-06-11 ENCOUNTER — Other Ambulatory Visit: Payer: Self-pay | Admitting: *Deleted

## 2017-06-11 MED ORDER — CARVEDILOL 6.25 MG PO TABS: 6.2500 mg | ORAL_TABLET | Freq: Two times a day (BID) | ORAL | 5 refills | Status: DC

## 2017-06-11 MED ORDER — HYDRALAZINE HCL 10 MG PO TABS: 10.0000 mg | ORAL_TABLET | Freq: Three times a day (TID) | ORAL | 5 refills | Status: DC

## 2017-06-11 NOTE — Telephone Encounter (Signed)
New Message      *STAT* If patient is at the pharmacy, call can be transferred to refill team.   1. Which medications need to be refilled? (please list name of each medication and dose if known)  carvedilol (COREG) 6.25 MG tablet Take 1 tablet (6.25 mg total) by mouth 2 (two) times daily with a meal   hydrALAZINE (APRESOLINE) 10 MG tablet Take 1 tablet (10 mg total) by mouth 3 (three) times daily     2. Which pharmacy/location (including street and city if local pharmacy) is medication to be sent to? [facility] on  [address]   3. Do they need a 30 day or 90 day supply? 30

## 2017-06-14 MED ORDER — CARVEDILOL 6.25 MG PO TABS: 6.2500 mg | ORAL_TABLET | Freq: Two times a day (BID) | ORAL | 5 refills | Status: DC

## 2017-06-14 MED ORDER — HYDRALAZINE HCL 10 MG PO TABS: 10.0000 mg | ORAL_TABLET | Freq: Three times a day (TID) | ORAL | 5 refills | Status: DC

## 2017-06-14 NOTE — Telephone Encounter (Signed)
Refill sent to the pharmacy electronically.  

## 2017-06-25 ENCOUNTER — Other Ambulatory Visit: Payer: Self-pay | Admitting: Internal Medicine

## 2017-06-28 ENCOUNTER — Ambulatory Visit: Payer: Self-pay | Admitting: Family Medicine

## 2017-08-20 ENCOUNTER — Telehealth: Payer: Self-pay | Admitting: Cardiology

## 2017-08-20 MED ORDER — FUROSEMIDE 40 MG PO TABS: ORAL_TABLET | ORAL | 0 refills | Status: DC

## 2017-08-20 NOTE — Telephone Encounter (Signed)
Refill sent to the pharmacy electronically.  

## 2017-08-20 NOTE — Telephone Encounter (Signed)
°*  STAT* If patient is at the pharmacy, call can be transferred to refill team.   1. Which medications need to be refilled? (please list name of each medication and dose if known) furosemide 40 mg  2. Which pharmacy/location (including street and city if local pharmacy) is medication to be sent to? [facility] - , [place] - [address] AT [facility] [address]  3. Do they need a 30 day or 90 day supply? 30  Patient is out medication

## 2017-11-22 ENCOUNTER — Other Ambulatory Visit: Payer: Self-pay | Admitting: Cardiology

## 2017-11-22 NOTE — Telephone Encounter (Signed)
Spoke with Patient and she stated she would like to have her Lasix increased to 2 tabs in the morning and one tab in the evening. Is this ok?  Thanks,  [employer]

## 2017-11-22 NOTE — Telephone Encounter (Signed)
New Message   Patient has been taken 2 in morning and 1 in the evening due to swelling for the last month   *STAT* If patient is at the pharmacy, call can be transferred to refill team.   1. Which medications need to be refilled? (please list name of each medication and dose if known)  furosemide (LASIX) 40 MG tablet Take 60 mg in morning and 40 mg in the evening     2. Which pharmacy/location (including street and city if local pharmacy) is medication to be sent to? [facility] on [address]   3. Do they need a 30 day or 90 day supply? Has apt on April 1st

## 2017-11-22 NOTE — Telephone Encounter (Signed)
I agree that she could take 80 mg of Lasix every morning and 40 mg 6 hours later.  When she makes this change and increases her Lasix she will need to have a BMET one week later.  Call Lisa Hodges with the results

## 2017-11-26 ENCOUNTER — Emergency Department (HOSPITAL_COMMUNITY): Payer: Medicaid Other

## 2017-11-26 ENCOUNTER — Other Ambulatory Visit: Payer: Self-pay

## 2017-11-26 ENCOUNTER — Inpatient Hospital Stay (HOSPITAL_COMMUNITY): Payer: Medicaid Other

## 2017-11-26 ENCOUNTER — Encounter (HOSPITAL_COMMUNITY): Payer: Self-pay | Admitting: *Deleted

## 2017-11-26 ENCOUNTER — Inpatient Hospital Stay (HOSPITAL_COMMUNITY)
Admission: EM | Admit: 2017-11-26 | Discharge: 2017-12-02 | DRG: 291 | Disposition: A | Discharge status: Home / Self Care | Payer: Medicaid Other | Attending: Internal Medicine | Admitting: Internal Medicine

## 2017-11-26 DIAGNOSIS — N179 Acute kidney failure, unspecified: Secondary | ICD-10-CM | POA: Diagnosis present

## 2017-11-26 DIAGNOSIS — D509 Iron deficiency anemia, unspecified: Secondary | ICD-10-CM | POA: Diagnosis present

## 2017-11-26 DIAGNOSIS — E669 Obesity, unspecified: Secondary | ICD-10-CM | POA: Diagnosis present

## 2017-11-26 DIAGNOSIS — E1122 Type 2 diabetes mellitus with diabetic chronic kidney disease: Secondary | ICD-10-CM | POA: Diagnosis present

## 2017-11-26 DIAGNOSIS — Z794 Long term (current) use of insulin: Secondary | ICD-10-CM

## 2017-11-26 DIAGNOSIS — I130 Hypertensive heart and chronic kidney disease with heart failure and stage 1 through stage 4 chronic kidney disease, or unspecified chronic kidney disease: Secondary | ICD-10-CM | POA: Diagnosis not present

## 2017-11-26 DIAGNOSIS — I160 Hypertensive urgency: Secondary | ICD-10-CM | POA: Diagnosis present

## 2017-11-26 DIAGNOSIS — E1121 Type 2 diabetes mellitus with diabetic nephropathy: Secondary | ICD-10-CM | POA: Diagnosis present

## 2017-11-26 DIAGNOSIS — N189 Chronic kidney disease, unspecified: Secondary | ICD-10-CM

## 2017-11-26 DIAGNOSIS — Z23 Encounter for immunization: Secondary | ICD-10-CM | POA: Diagnosis not present

## 2017-11-26 DIAGNOSIS — N19 Unspecified kidney failure: Secondary | ICD-10-CM

## 2017-11-26 DIAGNOSIS — N183 Chronic kidney disease, stage 3 (moderate): Secondary | ICD-10-CM | POA: Diagnosis present

## 2017-11-26 DIAGNOSIS — I2510 Atherosclerotic heart disease of native coronary artery without angina pectoris: Secondary | ICD-10-CM | POA: Diagnosis present

## 2017-11-26 DIAGNOSIS — E118 Type 2 diabetes mellitus with unspecified complications: Secondary | ICD-10-CM | POA: Diagnosis not present

## 2017-11-26 DIAGNOSIS — E119 Type 2 diabetes mellitus without complications: Secondary | ICD-10-CM | POA: Diagnosis not present

## 2017-11-26 DIAGNOSIS — Z9114 Patient's other noncompliance with medication regimen: Secondary | ICD-10-CM

## 2017-11-26 DIAGNOSIS — I10 Essential (primary) hypertension: Secondary | ICD-10-CM | POA: Diagnosis present

## 2017-11-26 DIAGNOSIS — I429 Cardiomyopathy, unspecified: Secondary | ICD-10-CM | POA: Diagnosis present

## 2017-11-26 DIAGNOSIS — I340 Nonrheumatic mitral (valve) insufficiency: Secondary | ICD-10-CM

## 2017-11-26 DIAGNOSIS — Z833 Family history of diabetes mellitus: Secondary | ICD-10-CM | POA: Diagnosis not present

## 2017-11-26 DIAGNOSIS — I5023 Acute on chronic systolic (congestive) heart failure: Secondary | ICD-10-CM | POA: Diagnosis present

## 2017-11-26 DIAGNOSIS — D649 Anemia, unspecified: Secondary | ICD-10-CM | POA: Diagnosis not present

## 2017-11-26 DIAGNOSIS — Z8679 Personal history of other diseases of the circulatory system: Secondary | ICD-10-CM

## 2017-11-26 DIAGNOSIS — Z9119 Patient's noncompliance with other medical treatment and regimen: Secondary | ICD-10-CM

## 2017-11-26 DIAGNOSIS — D638 Anemia in other chronic diseases classified elsewhere: Secondary | ICD-10-CM | POA: Diagnosis present

## 2017-11-26 DIAGNOSIS — I272 Pulmonary hypertension, unspecified: Secondary | ICD-10-CM | POA: Diagnosis present

## 2017-11-26 DIAGNOSIS — Z6841 Body Mass Index (BMI) 40.0 and over, adult: Secondary | ICD-10-CM | POA: Diagnosis not present

## 2017-11-26 DIAGNOSIS — I5022 Chronic systolic (congestive) heart failure: Secondary | ICD-10-CM | POA: Diagnosis present

## 2017-11-26 DIAGNOSIS — Z79899 Other long term (current) drug therapy: Secondary | ICD-10-CM | POA: Diagnosis not present

## 2017-11-26 DIAGNOSIS — IMO0001 Reserved for inherently not codable concepts without codable children: Secondary | ICD-10-CM

## 2017-11-26 DIAGNOSIS — R0602 Shortness of breath: Secondary | ICD-10-CM | POA: Diagnosis not present

## 2017-11-26 HISTORY — DX: Unspecified systolic (congestive) heart failure: I50.20

## 2017-11-26 LAB — BASIC METABOLIC PANEL
Anion gap: 10 (ref 5–15)
BUN: 52 mg/dL — AB (ref 6–20)
CHLORIDE: 109 mmol/L (ref 101–111)
CO2: 20 mmol/L — AB (ref 22–32)
CREATININE: 4.94 mg/dL — AB (ref 0.44–1.00)
Calcium: 6.7 mg/dL — ABNORMAL LOW (ref 8.9–10.3)
GFR calc Af Amer: 12 mL/min — ABNORMAL LOW (ref >60)
GFR calc non Af Amer: 11 mL/min — ABNORMAL LOW (ref >60)
Glucose, Bld: 203 mg/dL — ABNORMAL HIGH (ref 65–99)
POTASSIUM: 3.8 mmol/L (ref 3.5–5.1)
Sodium: 139 mmol/L (ref 135–145)

## 2017-11-26 LAB — PROCALCITONIN: Procalcitonin: 0.13 ng/mL

## 2017-11-26 LAB — VITAMIN B12: VITAMIN B 12: 542 pg/mL (ref 180–914)

## 2017-11-26 LAB — GLUCOSE, CAPILLARY
GLUCOSE-CAPILLARY: 107 mg/dL — AB (ref 65–99)
GLUCOSE-CAPILLARY: 145 mg/dL — AB (ref 65–99)

## 2017-11-26 LAB — CBC
HEMATOCRIT: 27.9 % — AB (ref 36.0–46.0)
Hemoglobin: 8.4 g/dL — ABNORMAL LOW (ref 12.0–15.0)
MCH: 19.2 pg — AB (ref 26.0–34.0)
MCHC: 30.1 g/dL (ref 30.0–36.0)
MCV: 63.8 fL — AB (ref 78.0–100.0)
PLATELETS: 368 10*3/uL (ref 150–400)
RBC: 4.37 MIL/uL (ref 3.87–5.11)
RDW: 16.6 % — AB (ref 11.5–15.5)
WBC: 7.3 10*3/uL (ref 4.0–10.5)

## 2017-11-26 LAB — HEMOGLOBIN A1C
HEMOGLOBIN A1C: 7.6 % — AB (ref 4.8–5.6)
Mean Plasma Glucose: 171.42 mg/dL

## 2017-11-26 LAB — HIV ANTIBODY (ROUTINE TESTING W REFLEX): HIV Screen 4th Generation wRfx: NONREACTIVE

## 2017-11-26 LAB — I-STAT BETA HCG BLOOD, ED (MC, WL, AP ONLY): I-stat hCG, quantitative: <5 m[IU]/mL (ref <5)

## 2017-11-26 LAB — IRON AND TIBC
IRON: 19 ug/dL — AB (ref 28–170)
Saturation Ratios: 8 % — ABNORMAL LOW (ref 10.4–31.8)
TIBC: 245 ug/dL — ABNORMAL LOW (ref 250–450)
UIBC: 226 ug/dL

## 2017-11-26 LAB — FOLATE: FOLATE: 10.4 ng/mL (ref >5.9)

## 2017-11-26 LAB — CBG MONITORING, ED
GLUCOSE-CAPILLARY: 133 mg/dL — AB (ref 65–99)
GLUCOSE-CAPILLARY: 152 mg/dL — AB (ref 65–99)

## 2017-11-26 LAB — RETICULOCYTES
RBC.: 4.48 MIL/uL (ref 3.87–5.11)
RETIC COUNT ABSOLUTE: 94.1 10*3/uL (ref 19.0–186.0)
Retic Ct Pct: 2.1 % (ref 0.4–3.1)

## 2017-11-26 LAB — ECHOCARDIOGRAM COMPLETE
Height: 57 in
Weight: 3200 oz

## 2017-11-26 LAB — BRAIN NATRIURETIC PEPTIDE: B NATRIURETIC PEPTIDE 5: 841.6 pg/mL — AB (ref 0.0–100.0)

## 2017-11-26 LAB — TROPONIN I

## 2017-11-26 LAB — FERRITIN: Ferritin: 9 ng/mL — ABNORMAL LOW (ref 11–307)

## 2017-11-26 MED ORDER — FUROSEMIDE 10 MG/ML IJ SOLN
60.0000 mg | Freq: Two times a day (BID) | INTRAMUSCULAR | Status: DC
  Administered 2017-11-26: 60 mg via INTRAVENOUS
  Filled 2017-11-26: qty 6

## 2017-11-26 MED ORDER — HYDRALAZINE HCL 10 MG PO TABS
10.0000 mg | ORAL_TABLET | Freq: Three times a day (TID) | ORAL | Status: DC
  Administered 2017-11-26 – 2017-11-28 (×7): 10 mg via ORAL
  Filled 2017-11-26 (×8): qty 1

## 2017-11-26 MED ORDER — SODIUM CHLORIDE 0.9% FLUSH
3.0000 mL | Freq: Two times a day (BID) | INTRAVENOUS | Status: DC
  Administered 2017-11-26 – 2017-12-02 (×12): 3 mL via INTRAVENOUS

## 2017-11-26 MED ORDER — SODIUM CHLORIDE 0.9 % IV SOLN: 250.0000 mL | INTRAVENOUS | Status: DC | PRN

## 2017-11-26 MED ORDER — INFLUENZA VAC SPLIT QUAD 0.5 ML IM SUSY
0.5000 mL | PREFILLED_SYRINGE | INTRAMUSCULAR | Status: AC
  Administered 2017-11-28: 0.5 mL via INTRAMUSCULAR
  Filled 2017-11-26: qty 0.5

## 2017-11-26 MED ORDER — INSULIN ASPART 100 UNIT/ML ~~LOC~~ SOLN
0.0000 [IU] | Freq: Three times a day (TID) | SUBCUTANEOUS | Status: DC
  Administered 2017-11-26: 1 [IU] via SUBCUTANEOUS
  Administered 2017-11-27: 2 [IU] via SUBCUTANEOUS
  Administered 2017-11-27 (×2): 1 [IU] via SUBCUTANEOUS
  Administered 2017-11-28: 2 [IU] via SUBCUTANEOUS
  Administered 2017-11-28 – 2017-11-29 (×3): 1 [IU] via SUBCUTANEOUS
  Administered 2017-11-30: 2 [IU] via SUBCUTANEOUS
  Administered 2017-11-30 – 2017-12-02 (×5): 1 [IU] via SUBCUTANEOUS
  Filled 2017-11-26: qty 1

## 2017-11-26 MED ORDER — CARVEDILOL 6.25 MG PO TABS
6.2500 mg | ORAL_TABLET | Freq: Two times a day (BID) | ORAL | Status: DC
  Administered 2017-11-26 – 2017-12-01 (×11): 6.25 mg via ORAL
  Filled 2017-11-26 (×11): qty 1

## 2017-11-26 MED ORDER — FUROSEMIDE 10 MG/ML IJ SOLN
60.0000 mg | Freq: Once | INTRAMUSCULAR | Status: AC
  Administered 2017-11-26: 60 mg via INTRAVENOUS
  Filled 2017-11-26: qty 6

## 2017-11-26 MED ORDER — ACETAMINOPHEN 325 MG PO TABS
650.0000 mg | ORAL_TABLET | ORAL | Status: DC | PRN
Start: 2017-11-26 — End: 2017-12-02

## 2017-11-26 MED ORDER — SODIUM CHLORIDE 0.9% FLUSH: 3.0000 mL | INTRAVENOUS | Status: DC | PRN

## 2017-11-26 MED ORDER — ONDANSETRON HCL 4 MG/2ML IJ SOLN: 4.0000 mg | Freq: Four times a day (QID) | INTRAMUSCULAR | Status: DC | PRN

## 2017-11-26 MED ORDER — INSULIN ASPART 100 UNIT/ML ~~LOC~~ SOLN: 0.0000 [IU] | Freq: Every day | SUBCUTANEOUS | Status: DC

## 2017-11-26 MED ORDER — ALPRAZOLAM 0.25 MG PO TABS: 0.2500 mg | ORAL_TABLET | Freq: Two times a day (BID) | ORAL | Status: DC | PRN

## 2017-11-26 MED ORDER — ISOSORBIDE MONONITRATE ER 30 MG PO TB24
30.0000 mg | ORAL_TABLET | Freq: Every day | ORAL | Status: DC
  Administered 2017-11-26 – 2017-11-28 (×3): 30 mg via ORAL
  Filled 2017-11-26 (×3): qty 1

## 2017-11-26 MED ORDER — ZOLPIDEM TARTRATE 5 MG PO TABS: 5.0000 mg | ORAL_TABLET | Freq: Every evening | ORAL | Status: DC | PRN

## 2017-11-26 NOTE — ED Provider Notes (Signed)
[place] EMERGENCY DEPARTMENT Provider Note   CSN: [id number] Arrival date & time: 11/26/17  0144     History   Chief Complaint Chief Complaint  Patient presents with  . Shortness of Breath    HPI Lisa Hodges is a 32 y.o. female.  Patient with history of cardiomyopathy (felt to be non-ischemic, EF 30% on study 01/2017, followed by Dr. Percival Spanish), IDDM, HTN, obesity, presents with increasing SOB over 2-4 days. She has a cough without fever. She states her legs are swelling and that she has been out of her Lasix for the last several weeks. She has also not had her insulin for several months. No nausea or vomiting. No chest pain.   The history is provided by the patient. No language interpreter was used.  Shortness of Breath  Associated symptoms include cough and leg swelling. Pertinent negatives include no fever, no chest pain, no vomiting and no abdominal pain.    Past Medical History:  Diagnosis Date  . Diabetes mellitus without complication ([place])   . Hypertension     Patient Active Problem List   Diagnosis Date Noted  . Medication management 01/29/2017  . Morbid obesity ([place]) 11/16/2016  . 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 Acute respiratory failure ([place]) 10/26/2016  . Acute systolic congestive heart failure ([place]) 10/26/2016  . Chronic renal insufficiency, stage III (moderate) ([place]) 10/26/2016  . Hypertension 10/26/2016  . Insulin dependent diabetes mellitus with complications ([place]) 14/38/8875    Past Surgical History:  Procedure Laterality Date  . CESAREAN SECTION      OB History    Gravida Para Term Preterm AB Living   2 1   1   1    SAB TAB Ectopic Multiple Live Births                   Home Medications    Prior to Admission medications   Medication Sig Start Date End Date Taking? Authorizing Provider  ACCU-CHEK SOFTCLIX LANCETS lancets Use as instructed 12/04/16   Rowe Clack, MD  Blood  Glucose Monitoring Suppl (ACCU-CHEK AVIVA PLUS) w/Device KIT Use as directed 12/04/16   Rowe Clack, MD  carvedilol (COREG) 6.25 MG tablet Take 1 tablet (6.25 mg total) by mouth 2 (two) times daily with a meal. 06/14/17   Minus Breeding, MD  furosemide (LASIX) 40 MG tablet Take 60 mg in morning and 40 mg in the evening 08/20/17   Minus Breeding, MD  glucose blood (ACCU-CHEK AVIVA PLUS) test strip Use as instructed 12/04/16   Rowe Clack, MD  hydrALAZINE (APRESOLINE) 10 MG tablet Take 1 tablet (10 mg total) by mouth 3 (three) times daily. 06/14/17   Minus Breeding, MD  insulin aspart protamine- aspart (NOVOLOG MIX 70/30) (70-30) 100 UNIT/ML injection Inject 0.08 mLs (8 Units total) into the skin 2 (two) times daily with a meal. 11/22/16 12/22/16  Rowe Clack, MD  Insulin Syringe-Needle U-100 (INSULIN SYRINGE .5CC/31GX5/16") 31G X 5/16" 0.5 ML MISC USE AS DIRECTED. 06/01/17   Rica Mast, RPH-CPP  isosorbide mononitrate (IMDUR) 30 MG 24 hr tablet Take 1 tablet (30 mg total) by mouth daily. 01/29/17   Minus Breeding, MD  Lancets Misc. (ACCU-CHEK SOFTCLIX LANCET DEV) KIT Use as directed 12/04/16   Rowe Clack, MD    Family History No family history on file.  Social History Social History   Tobacco Use  . Smoking status: Never Smoker  . Smokeless tobacco: Never Used  Substance Use Topics  . Alcohol use: No  . Drug use: No     Allergies   Patient has no known allergies.   Review of Systems Review of Systems  Constitutional: Negative for diaphoresis and fever.  HENT: Negative for congestion and sinus pressure.   Respiratory: Positive for cough and shortness of breath.   Cardiovascular: Positive for leg swelling. Negative for chest pain.  Gastrointestinal: Negative for abdominal pain, nausea and vomiting.  Musculoskeletal: Negative for myalgias.  Neurological: Negative for weakness and light-headedness.     Physical Exam Updated Vital Signs BP (!)  162/100 (BP Location: Right Arm)   Pulse 98   Temp 98.4 F (36.9 C) (Oral)   Resp 18   Ht 4' 9"  (1.448 m)   Wt 90.7 kg (200 lb)   LMP 10/29/2017   SpO2 100%   BMI 43.28 kg/m   Physical Exam  Constitutional: She is oriented to person, place, and time. She appears well-developed and well-nourished.  HENT:  Head: Normocephalic.  Neck: Normal range of motion. Neck supple.  Cardiovascular: Normal rate and regular rhythm.  Pulmonary/Chest: Effort normal and breath sounds normal. Tachypnea noted. She has no wheezes. She has no rales.  Abdominal: Soft. Bowel sounds are normal. There is no tenderness. There is no rebound and no guarding.  Musculoskeletal: Normal range of motion.       Right lower leg: She exhibits edema.       Left lower leg: She exhibits edema.  Neurological: She is alert and oriented to person, place, and time.  Skin: Skin is warm and dry. No rash noted.  Psychiatric: She has a normal mood and affect.     ED Treatments / Results  Labs (all labs ordered are listed, but only abnormal results are displayed) Labs Reviewed  BASIC METABOLIC PANEL - Abnormal; Notable for the following components:      Result Value   CO2 20 (*)    Glucose, Bld 203 (*)    BUN 52 (*)    Creatinine, Ser 4.94 (*)    Calcium 6.7 (*)    GFR calc non Af Amer 11 (*)    GFR calc Af Amer 12 (*)    All other components within normal limits  CBC - Abnormal; Notable for the following components:   Hemoglobin 8.4 (*)    HCT 27.9 (*)    MCV 63.8 (*)    MCH 19.2 (*)    RDW 16.6 (*)    All other components within normal limits  BRAIN NATRIURETIC PEPTIDE - Abnormal; Notable for the following components:   B Natriuretic Peptide 841.6 (*)    All other components within normal limits  TROPONIN I  I-STAT BETA HCG BLOOD, ED ([facility], [facility], [facility] ONLY)    EKG  EKG Interpretation  Date/Time:  Monday November 26 2017 02:03:07 EST Ventricular Rate:  95 PR Interval:  146 QRS Duration: 78 QT  Interval:  376 QTC Calculation: 472 R Axis:   -52 Text Interpretation:  Normal sinus rhythm Left axis deviation Cannot rule out Anterior infarct , age undetermined Abnormal ECG No significant change was found Confirmed by Ezequiel Essex [phone]) on 11/26/2017 3:47:05 AM       Radiology Dg Chest 2 View  Result Date: 11/26/2017 CLINICAL DATA:  Shortness of breath EXAM: CHEST  2 VIEW COMPARISON:  10/26/2016, 10/12/2016 FINDINGS: Cardiomegaly with mild central vascular congestion. Streaky atelectasis at the left lung base. Small focal opacity in the left mid lung, new compared  to prior. No pneumothorax. IMPRESSION: 1. Oval focal opacity in the left mid lung, new compared to prior may represent focus of infection 2. Similar appearance of cardiomegaly with mild vascular congestion. 3. Partial but incomplete clearing of left basilar airspace disease. Electronically Signed   By: Donavan Foil M.D.   On: 11/26/2017 02:45    Procedures Procedures (including critical care time)  Medications Ordered in ED Medications  furosemide (LASIX) injection 60 mg (not administered)     Initial Impression / Assessment and Plan / ED Course  I have reviewed the triage vital signs and the nursing notes.  Pertinent labs & imaging results that were available during my care of the patient were reviewed by me and considered in my medical decision making (see chart for details).     Patient with complicated medical history presents with SOB, leg swelling like previous CHF, no lasix in weeks. No fever.   On exam she is tachypneic but does not appear in respiratory distress. Bilateral LE edema c/w fluid retention. Lasix 60 mg IV ordered.   Chart reviewed. Last follow up in office with [facility] cardiology was 01/2017. EF at that time was 30% per Dr. Rosezella [place] note. Cr significantly elevated to 4.94. Last comparable was 1 year ago and was 1.89. Potassium is normal. CXR does not show marked fluid overload.   Patient  will need to be admitted for further management of multiple medical problems, AKI/worsening renal failure, acute clinical CHF. Discussed with Dr. Myna Hidalgo who accepts the patient to his service.   Final Clinical Impressions(s) / ED Diagnoses   Final diagnoses:  None   1. CHF 2. AKI 3. History of NICM 4. Medication noncompliance  ED Discharge Orders    None       Dennie Bible 11/26/17 Theotis Burrow, MD 11/26/17 [phone]

## 2017-11-26 NOTE — H&P (Signed)
History and Physical    Carlyn Lemke HKV:[id number] DOB: [birth year] DOA: 11/26/2017  PCP: Rowe Clack, MD   Patient coming from: Home  Chief Complaint: SOB, swelling   HPI: Lisa Hodges is a 32 y.o. female with medical history significant for insulin-dependent diabetes mellitus, hypertension, chronic kidney disease stage III, and chronic systolic CHF, now presenting to the emergency department for evaluation of shortness of breath and swelling.  Patient reports that she ran out of her insulin approximately a month ago and ran out of her Lasix a couple weeks ago, and has noted the insidious development of shortness of breath and leg swelling over that interval.  She reports worsening orthopnea.  Denies chest pain, palpitations, fevers, chills, or productive cough.  Has not noticed any change in her urination.  ED Course: Upon arrival to the ED, patient is found to be afebrile, saturating adequately on room air, hypertensive to 160/100, and with vitals otherwise normal.  EKG features a sinus rhythm with LAD and chest x-ray is notable for cardiomegaly with pulmonary vascular congestion, oval opacity in the left midlung, and partial clearing of prior left basilar airspace disease.  Chemistry panel is concerning for creatinine of 4.94, up from 1.89 one-year ago.  CBC features a microcytic anemia with hemoglobin of 8.4 and MCV of 64, similar to prior.  BNP is elevated to 842 and troponin is undetectable.  Patient was treated with 60 mg IV Lasix in the ED, remains hemodynamically stable, and will be admitted to the telemetry unit for ongoing evaluation and management of acute on chronic systolic CHF, complicated by acute on chronic renal failure.  Review of Systems:  All other systems reviewed and apart from HPI, are negative.  Past Medical History:  Diagnosis Date  . Diabetes mellitus without complication ([place])   . Hypertension     Past Surgical History:  Procedure Laterality Date  .  CESAREAN SECTION       reports that  has never smoked. she has never used smokeless tobacco. She reports that she does not drink alcohol or use drugs.  No Known Allergies  Family History  Problem Relation Age of Onset  . Diabetes Mother   . Kidney failure Mother      Prior to Admission medications   Medication Sig Start Date End Date Taking? Authorizing Provider  carvedilol (COREG) 6.25 MG tablet Take 1 tablet (6.25 mg total) by mouth 2 (two) times daily with a meal. 06/14/17  Yes Minus Breeding, MD  furosemide (LASIX) 40 MG tablet Take 60 mg in morning and 40 mg in the evening Patient taking differently: Take 40-60 mg by mouth See admin instructions. Take 60 mg in morning and 40 mg in the evening  08/20/17  Yes Hochrein, Jeneen Rinks, MD  hydrALAZINE (APRESOLINE) 10 MG tablet Take 1 tablet (10 mg total) by mouth 3 (three) times daily. 06/14/17  Yes Minus Breeding, MD  insulin aspart protamine- aspart (NOVOLOG MIX 70/30) (70-30) 100 UNIT/ML injection Inject 0.08 mLs (8 Units total) into the skin 2 (two) times daily with a meal. 11/22/16 11/26/24 Yes Rowe Clack, MD  isosorbide mononitrate (IMDUR) 30 MG 24 hr tablet Take 1 tablet (30 mg total) by mouth daily. 01/29/17  Yes Minus Breeding, MD  ACCU-CHEK SOFTCLIX LANCETS lancets Use as instructed 12/04/16   Rowe Clack, MD  Blood Glucose Monitoring Suppl (ACCU-CHEK AVIVA PLUS) w/Device KIT Use as directed 12/04/16   Rowe Clack, MD  glucose blood (ACCU-CHEK AVIVA PLUS) test strip Use  as instructed 12/04/16   Rowe Clack, MD  Insulin Syringe-Needle U-100 (INSULIN SYRINGE .5CC/31GX5/16") 31G X 5/16" 0.5 ML MISC USE AS DIRECTED. 06/01/17   Rica Mast, RPH-CPP  Lancets Misc. (ACCU-CHEK SOFTCLIX LANCET DEV) KIT Use as directed 12/04/16   Rowe Clack, MD    Physical Exam: Vitals:   11/26/17 0148 11/26/17 0157 11/26/17 0356 11/26/17 0430  BP: (!) 162/100   (!) 161/96  Pulse: 98   98  Resp: 18   12  Temp: 98.4 F  (36.9 C)     TempSrc: Oral     SpO2: 100%  100% 99%  Weight:  90.7 kg (200 lb)    Height:  _0  (1.448 m)        Constitutional: NAD, calm  Eyes: PERTLA, lids and conjunctivae normal ENMT: Mucous membranes are moist. Posterior pharynx clear of any exudate or lesions.   Neck: normal, supple, no masses, no thyromegaly Respiratory: Fine rales in bases, no wheezing. No accessory muscle use.  Cardiovascular: S1 & S2 heard, regular rate and rhythm. Pretibial edema bilaterally.  Abdomen: No distension, no tenderness, no masses palpated. Bowel sounds normal.  Musculoskeletal: no clubbing / cyanosis. No joint deformity upper and lower extremities.   Skin: no significant rashes, lesions, ulcers. Warm, dry, well-perfused. Neurologic: CN 2-12 grossly intact. Sensation intact. Strength 5/5 in all 4 limbs.  Psychiatric: Alert and oriented x 3. Pleasant and cooperative.     Labs on Admission: I have personally reviewed following labs and imaging studies  CBC: Recent Labs  Lab 11/26/17 0200  WBC 7.3  HGB 8.4*  HCT 27.9*  MCV 63.8*  PLT 481   Basic Metabolic Panel: Recent Labs  Lab 11/26/17 0200  NA 139  K 3.8  CL 109  CO2 20*  GLUCOSE 203*  BUN 52*  CREATININE 4.94*  CALCIUM 6.7*   GFR: Estimated Creatinine Clearance: 15.2 mL/min (A) (by C-G formula based on SCr of 4.94 mg/dL (H)). Liver Function Tests: No results for input(s): AST, ALT, ALKPHOS, BILITOT, PROT, ALBUMIN in the last 168 hours. No results for input(s): LIPASE, AMYLASE in the last 168 hours. No results for input(s): AMMONIA in the last 168 hours. Coagulation Profile: No results for input(s): INR, PROTIME in the last 168 hours. Cardiac Enzymes: Recent Labs  Lab 11/26/17 0200  TROPONINI <0.03   BNP (last 3 results) No results for input(s): PROBNP in the last 8760 hours. HbA1C: No results for input(s): HGBA1C in the last 72 hours. CBG: No results for input(s): GLUCAP in the last 168 hours. Lipid  Profile: No results for input(s): CHOL, HDL, LDLCALC, TRIG, CHOLHDL, LDLDIRECT in the last 72 hours. Thyroid Function Tests: No results for input(s): TSH, T4TOTAL, FREET4, T3FREE, THYROIDAB in the last 72 hours. Anemia Panel: No results for input(s): VITAMINB12, FOLATE, FERRITIN, TIBC, IRON, RETICCTPCT in the last 72 hours. Urine analysis:    Component Value Date/Time   COLORURINE STRAW (A) 10/28/2016 1806   APPEARANCEUR CLEAR 10/28/2016 1806   LABSPEC 1.006 10/28/2016 1806   PHURINE 6.0 10/28/2016 1806   GLUCOSEU 50 (A) 10/28/2016 1806   HGBUR SMALL (A) 10/28/2016 1806   BILIRUBINUR NEGATIVE 10/28/2016 1806   KETONESUR NEGATIVE 10/28/2016 1806   PROTEINUR 100 (A) 10/28/2016 1806   UROBILINOGEN 0.2 09/01/2014 2124   NITRITE NEGATIVE 10/28/2016 1806   LEUKOCYTESUR NEGATIVE 10/28/2016 1806   Sepsis Labs: _1 (procalcitonin:4,lacticidven:4) )No results found for this or any previous visit (from the past 240 hour(s)).   Radiological Exams on  Admission: Dg Chest 2 View  Result Date: 11/26/2017 CLINICAL DATA:  Shortness of breath EXAM: CHEST  2 VIEW COMPARISON:  10/26/2016, 10/12/2016 FINDINGS: Cardiomegaly with mild central vascular congestion. Streaky atelectasis at the left lung base. Small focal opacity in the left mid lung, new compared to prior. No pneumothorax. IMPRESSION: 1. Oval focal opacity in the left mid lung, new compared to prior may represent focus of infection 2. Similar appearance of cardiomegaly with mild vascular congestion. 3. Partial but incomplete clearing of left basilar airspace disease. Electronically Signed   By: Donavan Foil M.D.   On: 11/26/2017 02:45    EKG: Independently reviewed. Sinus rhythm, LAD.   Assessment/Plan  1. Acute on chronic systolic CHF  - Presents with progressive dyspnea, orthopnea, peripheral edema, and wt gain after running out of Lasix weeks ago   - There is vascular congestion on CXR with focal opacity possibly representing  infection, but no fever, leukocytosis, or productive cough; will check procalcitonin   - Peripheral edema is noted, PMI displaced laterally, JVP not well-visualized  - Echo from February 2018 with EF 30-35%, mild LVH, no WMA's, diffuse HK, mild AR, and mild MR  - Stress test last Spring was low-risk study  - Treated in ED with Lasix 60 mg IV  - Continue cardiac monitoring, SLIV, fluid-restrict diet, follow daily wt and I/O's, continue diuresis with Lasix 60 mg IV q12h, continue Coreg, ACE/ARB precluded by renal failure, update echo, follow daily chem panel during diuresis    2. Acute renal failure superimposed on CKD stage III  - SCr is 4.94 on admission, up from 1.89 one year ago  - Denies change in urine  - Has not been on ACE/ARB  - Possibly cardiorenal   - Check renal US and urine studies, follow daily chem panel, renally-dose medications, avoid nephrotoxins    3. Hypertension with hypertensive urgency  - BP 160/100 in ED  - Anticipate improvement with diuresis  - Continue Coreg and hydralazine    4. Insulin-dependent DM  - A1c was 10.1% one year ago  - Managed at home with Novolog 70/30 8 units BID  - Check CBG's and start low-intensity SSI with Novolog    5. Microcytic anemia  - Hgb is 8.4 on admission with MCV of 64 - Both indices similar to one year ago  - No bleeding  - Likely chronic disease; MCV was the same remotely when Hgb was close to 12, suggesting a thalassemia    - Check anemia panel     DVT prophylaxis: SCD's Code Status: Full  Family Communication: Discussed with patient Disposition Plan: Admit to telemetry Consults called: None Admission status: Inpatient    Vianne Bulls, MD [facility] Pager [phone]  If 7PM-7AM, please contact night-coverage [email or web address] Password [email or web address]  11/26/2017, 6:03 AM

## 2017-11-26 NOTE — ED Notes (Signed)
Pt is aware that a urine is needed. 

## 2017-11-26 NOTE — ED Notes (Signed)
Transported to Xray for Renal u/s

## 2017-11-26 NOTE — ED Notes (Signed)
Patient presents to ed c/o sob and cough onset several days ago. States she has been out of her fluid pills for several weeks. Bilateral lower ext. Swelling . States she hasn't had her insulin in several months.

## 2017-11-26 NOTE — Discharge Planning (Signed)
RNCM consult to assist pt with new PCP.  Pt has [employer] with an assigned PCP.  Pt must contact DSS to have PCP changed in order to be seen by another MD.

## 2017-11-26 NOTE — Care Management Note (Signed)
Case Management Note  Patient Details  Name: Lisa Hodges MRN: [id number] Date of Birth: [birth year]  Subjective/Objective:                  32 y.o. female with medical history significant for insulin-dependent diabetes mellitus, hypertension, chronic kidney disease stage III, and chronic systolic CHF, now presenting to the emergency department for evaluation of shortness of breath and swelling. From home with significant other.    Action/Plan: Admit status INPATIENT (CHF); anticipate discharge HOME WITH SELF CARE.   Expected Discharge Date:  (TBD)               Expected Discharge Plan:  Home/Self Care  In-House Referral:     Discharge planning Services  CM Consult  Post Acute Care Choice:    Choice offered to:     DME Arranged:    DME Agency:     HH Arranged:    HH Agency:     Status of Service:  In process, will continue to follow  If discussed at Long Length of Stay Meetings, dates discussed:    Additional Comments: PCP: Franne Forts, RN 11/26/2017, 8:46 AM

## 2017-11-26 NOTE — Progress Notes (Signed)
Patient admitted after midnight, please see H&P.  Has not been taking insulin nor lasix.  Cr elevated from baseline.  Daily weights and strict I/Os  Marlin Canary DO

## 2017-11-26 NOTE — ED Triage Notes (Signed)
The pt is c/o sob  For 2 days she has a hx of chf and she ran olut of lasix one week ago.  She gets har lasix refilled tomorrow but she reports that she is too sob to wait  She arrived by gems

## 2017-11-27 LAB — GLUCOSE, CAPILLARY
GLUCOSE-CAPILLARY: 121 mg/dL — AB (ref 65–99)
GLUCOSE-CAPILLARY: 159 mg/dL — AB (ref 65–99)
GLUCOSE-CAPILLARY: 137 mg/dL — AB (ref 65–99)
GLUCOSE-CAPILLARY: 174 mg/dL — AB (ref 65–99)

## 2017-11-27 LAB — BASIC METABOLIC PANEL
Anion gap: 11 (ref 5–15)
BUN: 51 mg/dL — ABNORMAL HIGH (ref 6–20)
CHLORIDE: 110 mmol/L (ref 101–111)
CO2: 20 mmol/L — AB (ref 22–32)
Calcium: 7 mg/dL — ABNORMAL LOW (ref 8.9–10.3)
Creatinine, Ser: 5.01 mg/dL — ABNORMAL HIGH (ref 0.44–1.00)
GFR calc non Af Amer: 10 mL/min — ABNORMAL LOW (ref >60)
GFR, EST AFRICAN AMERICAN: 12 mL/min — AB (ref >60)
Glucose, Bld: 125 mg/dL — ABNORMAL HIGH (ref 65–99)
POTASSIUM: 3.7 mmol/L (ref 3.5–5.1)
SODIUM: 141 mmol/L (ref 135–145)

## 2017-11-27 LAB — SODIUM, URINE, RANDOM: Sodium, Ur: 99 mmol/L

## 2017-11-27 LAB — CREATININE, URINE, RANDOM: Creatinine, Urine: 41.91 mg/dL

## 2017-11-27 MED ORDER — ALUM & MAG HYDROXIDE-SIMETH 200-200-20 MG/5ML PO SUSP
30.0000 mL | Freq: Four times a day (QID) | ORAL | Status: DC | PRN
  Administered 2017-11-28: 30 mL via ORAL
  Filled 2017-11-27: qty 30

## 2017-11-27 MED ORDER — FUROSEMIDE 10 MG/ML IJ SOLN: 60.0000 mg | Freq: Two times a day (BID) | INTRAMUSCULAR | Status: DC

## 2017-11-27 MED ORDER — FUROSEMIDE 10 MG/ML IJ SOLN
60.0000 mg | Freq: Two times a day (BID) | INTRAMUSCULAR | Status: DC
  Administered 2017-11-27 – 2017-11-28 (×2): 60 mg via INTRAVENOUS
  Filled 2017-11-27 (×2): qty 6

## 2017-11-27 NOTE — Progress Notes (Signed)
PROGRESS NOTE    Lisa Hodges  FXJ:[id number] DOB: 15-Apr-1986 DOA: 11/26/2017 PCP: Leilani Able, MD   Outpatient Specialists:    Brief Narrative:  Lisa Hodges is a 32 y.o. female with medical history significant for insulin-dependent diabetes mellitus, hypertension, chronic kidney disease stage III, and chronic systolic CHF, now presenting to the emergency department for evaluation of shortness of breath and swelling.  Patient reports that she ran out of her insulin approximately a month ago and ran out of her Lasix a couple weeks ago, and has noted the insidious development of shortness of breath and leg swelling over that interval.  She reports worsening orthopnea.  Denies chest pain, palpitations, fevers, chills, or productive cough.  Has not noticed any change in her urination.     Assessment & Plan:   Principal Problem:   Acute on chronic systolic CHF (congestive heart failure) (HCC) Active Problems:   Insulin dependent diabetes mellitus with complications ([facility])   Hypertension   Acute on chronic renal failure ([facility])   Microcytic anemia   Hypertensive urgency   Acute on chronic systolic CHF  - Presents with progressive dyspnea, orthopnea, peripheral edema, and wt gain  -story changes from running out of lasix to taking more than prescribed by cardiology due to swelling - There is vascular congestion on CXR with focal opacity possibly representing infection, but no fever, leukocytosis, or productive cough; will check procalcitonin   - Echo from February 2018 with EF 30-35%, mild LVH, no WMA's, diffuse HK, mild AR, and mild MR -- echo now similar - Stress test last Spring was low-risk study  - continue IV lasix--  -strict I/os -daily weights  Acute renal failure superimposed on CKD stage III  - SCr is 4.94 on admission, up from 1.89 one year ago  - Denies change in urine  - Has not been on ACE/ARB  - Possibly cardiorenal   - renal US normal -if not improved with diuresis  in AM will need nephrology consult   Hypertension with hypertensive urgency  - BP 160/100 in ED  - Continue Coreg and hydralazine   -improved   Insulin-dependent DM  - A1c was 10.1% one year ago now 7.6 -was only taking metformin at home  Microcytic anemia  - Hgb is 8.4 on admission with MCV of 64 - Both indices similar to one year ago  - No bleeding  - Likely chronic disease      DVT prophylaxis:  SCD's  Code Status: Full Code   Family Communication:   Disposition Plan:     Consultants:      Subjective: Still feels swollen  Objective: Vitals:   11/26/17 2018 11/27/17 0025 11/27/17 0435 11/27/17 1250  BP: (!) 149/94 133/77 (!) 147/86 118/70  Pulse: 93 94 (!) 101 89  Resp: 18 18 18 18   Temp: 98.2 F (36.8 C) 98.2 F (36.8 C) 98.2 F (36.8 C) 97.9 F (36.6 C)  TempSrc: Oral Oral Oral Oral  SpO2: 100% 99% 100% 100%  Weight:   93.6 kg (206 lb 4.8 oz)   Height:        Intake/Output Summary (Last 24 hours) at 11/27/2017 1316 Last data filed at 11/27/2017 1255 Gross per 24 hour  Intake 870 ml  Output 1300 ml  Net -430 ml   Filed Weights   11/26/17 0157 11/26/17 1412 11/27/17 0435  Weight: 90.7 kg (200 lb) 93.6 kg (206 lb 4.8 oz) 93.6 kg (206 lb 4.8 oz)    Examination:  General  exam: Appears calm and comfortable  Respiratory system: Clear to auscultation. Respiratory effort normal. Cardiovascular system: S1 & S2 heard, RRR. No JVD, murmurs, rubs, gallops or clicks. +edema in b/l LE and abd Gastrointestinal system: Abdomen is nondistended, soft and nontender. No organomegaly or masses felt. Normal bowel sounds heard. Central nervous system: Alert and oriented. No focal neurological deficits. Extremities: Symmetric 5 x 5 power. Psychiatry: impaired insight    Data Reviewed: I have personally reviewed following labs and imaging studies  CBC: Recent Labs  Lab 11/26/17 0200  WBC 7.3  HGB 8.4*  HCT 27.9*  MCV 63.8*  PLT 368   Basic  Metabolic Panel: Recent Labs  Lab 11/26/17 0200 11/27/17 0502  NA 139 141  K 3.8 3.7  CL 109 110  CO2 20* 20*  GLUCOSE 203* 125*  BUN 52* 51*  CREATININE 4.94* 5.01*  CALCIUM 6.7* 7.0*   GFR: Estimated Creatinine Clearance: 15.3 mL/min (A) (by C-G formula based on SCr of 5.01 mg/dL (H)). Liver Function Tests: No results for input(s): AST, ALT, ALKPHOS, BILITOT, PROT, ALBUMIN in the last 168 hours. No results for input(s): LIPASE, AMYLASE in the last 168 hours. No results for input(s): AMMONIA in the last 168 hours. Coagulation Profile: No results for input(s): INR, PROTIME in the last 168 hours. Cardiac Enzymes: Recent Labs  Lab 11/26/17 0200  TROPONINI <0.03   BNP (last 3 results) No results for input(s): PROBNP in the last 8760 hours. HbA1C: Recent Labs    11/26/17 0617  HGBA1C 7.6*   CBG: Recent Labs  Lab 11/26/17 1350 11/26/17 1701 11/26/17 2113 11/27/17 0722 11/27/17 1117  GLUCAP 152* 107* 145* 121* 159*   Lipid Profile: No results for input(s): CHOL, HDL, LDLCALC, TRIG, CHOLHDL, LDLDIRECT in the last 72 hours. Thyroid Function Tests: No results for input(s): TSH, T4TOTAL, FREET4, T3FREE, THYROIDAB in the last 72 hours. Anemia Panel: Recent Labs    11/26/17 0622 11/26/17 0623  VITAMINB12 542  --   FOLATE  --  10.4  FERRITIN 9*  --   TIBC 245*  --   IRON 19*  --   RETICCTPCT 2.1  --    Urine analysis:    Component Value Date/Time   COLORURINE STRAW (A) 10/28/2016 1806   APPEARANCEUR CLEAR 10/28/2016 1806   LABSPEC 1.006 10/28/2016 1806   PHURINE 6.0 10/28/2016 1806   GLUCOSEU 50 (A) 10/28/2016 1806   HGBUR SMALL (A) 10/28/2016 1806   BILIRUBINUR NEGATIVE 10/28/2016 1806   KETONESUR NEGATIVE 10/28/2016 1806   PROTEINUR 100 (A) 10/28/2016 1806   UROBILINOGEN 0.2 09/01/2014 2124   NITRITE NEGATIVE 10/28/2016 1806   LEUKOCYTESUR NEGATIVE 10/28/2016 1806     )No results found for this or any previous visit (from the past 240 hour(s)).     Anti-infectives (From admission, onward)   None       Radiology Studies: Dg Chest 2 View  Result Date: 11/26/2017 CLINICAL DATA:  Shortness of breath EXAM: CHEST  2 VIEW COMPARISON:  10/26/2016, 10/12/2016 FINDINGS: Cardiomegaly with mild central vascular congestion. Streaky atelectasis at the left lung base. Small focal opacity in the left mid lung, new compared to prior. No pneumothorax. IMPRESSION: 1. Oval focal opacity in the left mid lung, new compared to prior may represent focus of infection 2. Similar appearance of cardiomegaly with mild vascular congestion. 3. Partial but incomplete clearing of left basilar airspace disease. Electronically Signed   By: Jasmine Pang M.D.   On: 11/26/2017 02:45   US Renal  Result Date: 11/26/2017 CLINICAL DATA:  32 year old diabetic hypertensive female with renal failure today. Initial encounter. EXAM: RENAL / URINARY TRACT ULTRASOUND COMPLETE COMPARISON:  10/28/2016 renal sonogram. FINDINGS: Right Kidney: Length: 10.2 cm. Echogenicity within normal limits. No mass or hydronephrosis visualized. Left Kidney: Length: 10.4 cm. Echogenicity within normal limits. No mass or hydronephrosis visualized. Bladder: Appears normal for degree of bladder distention. Small bilateral pleural effusions. Uterine fibroids incompletely evaluated. IMPRESSION: Negative renal sonogram. Small bilateral pleural effusions. Uterine fibroids incompletely assessed. Electronically Signed   By: Lacy Duverney M.D.   On: 11/26/2017 07:02        Scheduled Meds: . carvedilol  6.25 mg Oral BID WC  . furosemide  60 mg Intravenous Q12H  . hydrALAZINE  10 mg Oral TID  . Influenza vac split quadrivalent PF  0.5 mL Intramuscular Tomorrow-1000  . insulin aspart  0-5 Units Subcutaneous QHS  . insulin aspart  0-9 Units Subcutaneous TID WC  . isosorbide mononitrate  30 mg Oral Daily  . sodium chloride flush  3 mL Intravenous Q12H   Continuous Infusions: . sodium chloride       LOS:  1 day    Time spent: 35 min    Joseph Art, DO [facility] Pager [phone]  If 7PM-7AM, please contact night-coverage [email or web address] Password [email or web address] 11/27/2017, 1:16 PM

## 2017-11-27 NOTE — Care Management Note (Signed)
Case Management Note  Patient Details  Name: Lisa Hodges MRN: [id number] Date of Birth: [birth year]  Subjective/Objective:   CHF               Action/Plan: Patient lives at home with her boyfriend and 32 yr old child with Cerebral Palsy; has private insurance with Medicaid with prescription drug coverage; pharmacy of choice is [facility]; patient has been assigned to [facility]- follow up hospital apt made for March 14,2019 at 4:30 pm with Dr Haskel Schroeder. CM also talked to patient about "running out of medication." Patient stated that she notice an increase in swelling and was taking double dosage without her physician/ Cardiologist approval. She also stated that she plans to monitor her fluid intake better and eat healthier. Nutrietional Consult placed for making wise food choices. CM will continue to follow for progression of care.  Expected Discharge Date:  Possibly 11/30/2017             Expected Discharge Plan:  Home/Self Care  In-House Referral:   Nutritional Consult  Discharge planning Services  CM Consult     Status of Service:  In process, will continue to follow  Reola Mosher [phone] 11/27/2017, 1:58 PM

## 2017-11-27 NOTE — Progress Notes (Signed)
Pt bladder scanned for 378 mls of urine. Patient then proceeded to bathroom and voided 400 mls.

## 2017-11-28 ENCOUNTER — Encounter (HOSPITAL_COMMUNITY): Payer: Self-pay | Admitting: Internal Medicine

## 2017-11-28 DIAGNOSIS — E119 Type 2 diabetes mellitus without complications: Secondary | ICD-10-CM

## 2017-11-28 DIAGNOSIS — N179 Acute kidney failure, unspecified: Secondary | ICD-10-CM

## 2017-11-28 DIAGNOSIS — D649 Anemia, unspecified: Secondary | ICD-10-CM

## 2017-11-28 LAB — GLUCOSE, CAPILLARY
Glucose-Capillary: 145 mg/dL — ABNORMAL HIGH (ref 65–99)
Glucose-Capillary: 166 mg/dL — ABNORMAL HIGH (ref 65–99)
Glucose-Capillary: 113 mg/dL — ABNORMAL HIGH (ref 65–99)
Glucose-Capillary: 147 mg/dL — ABNORMAL HIGH (ref 65–99)

## 2017-11-28 LAB — CBC
HEMATOCRIT: 27.1 % — AB (ref 36.0–46.0)
Hemoglobin: 8.3 g/dL — ABNORMAL LOW (ref 12.0–15.0)
MCH: 19.3 pg — ABNORMAL LOW (ref 26.0–34.0)
MCHC: 30.6 g/dL (ref 30.0–36.0)
MCV: 62.9 fL — ABNORMAL LOW (ref 78.0–100.0)
Platelets: 361 10*3/uL (ref 150–400)
RBC: 4.31 MIL/uL (ref 3.87–5.11)
RDW: 16.4 % — AB (ref 11.5–15.5)
WBC: 6.1 10*3/uL (ref 4.0–10.5)

## 2017-11-28 LAB — BASIC METABOLIC PANEL
Anion gap: 10 (ref 5–15)
BUN: 51 mg/dL — AB (ref 6–20)
CO2: 21 mmol/L — ABNORMAL LOW (ref 22–32)
Calcium: 6.8 mg/dL — ABNORMAL LOW (ref 8.9–10.3)
Chloride: 108 mmol/L (ref 101–111)
Creatinine, Ser: 5.03 mg/dL — ABNORMAL HIGH (ref 0.44–1.00)
GFR calc Af Amer: 12 mL/min — ABNORMAL LOW (ref >60)
GFR calc non Af Amer: 10 mL/min — ABNORMAL LOW (ref >60)
Glucose, Bld: 146 mg/dL — ABNORMAL HIGH (ref 65–99)
POTASSIUM: 3.9 mmol/L (ref 3.5–5.1)
SODIUM: 139 mmol/L (ref 135–145)

## 2017-11-28 LAB — UREA NITROGEN, URINE: UREA NITROGEN UR: 234 mg/dL

## 2017-11-28 MED ORDER — SODIUM CHLORIDE 0.9 % IV SOLN
1.0000 g | Freq: Once | INTRAVENOUS | Status: AC
  Administered 2017-11-28: 1 g via INTRAVENOUS
  Filled 2017-11-28 (×2): qty 10

## 2017-11-28 MED ORDER — HYDRALAZINE HCL 25 MG PO TABS: 25.0000 mg | ORAL_TABLET | Freq: Three times a day (TID) | ORAL | Status: DC

## 2017-11-28 MED ORDER — ISOSORB DINITRATE-HYDRALAZINE 20-37.5 MG PO TABS
1.0000 | ORAL_TABLET | Freq: Three times a day (TID) | ORAL | Status: DC
  Administered 2017-11-28 – 2017-12-02 (×12): 1 via ORAL
  Filled 2017-11-28 (×12): qty 1

## 2017-11-28 MED ORDER — SODIUM CHLORIDE 0.9 % IV SOLN
510.0000 mg | Freq: Once | INTRAVENOUS | Status: AC
  Administered 2017-12-01: 510 mg via INTRAVENOUS
  Filled 2017-11-28 (×3): qty 17

## 2017-11-28 MED ORDER — SODIUM CHLORIDE 0.9 % IV SOLN
510.0000 mg | Freq: Once | INTRAVENOUS | Status: AC
  Administered 2017-11-28: 510 mg via INTRAVENOUS
  Filled 2017-11-28: qty 17

## 2017-11-28 MED ORDER — FUROSEMIDE 10 MG/ML IJ SOLN
80.0000 mg | Freq: Two times a day (BID) | INTRAMUSCULAR | Status: DC
  Administered 2017-11-28 – 2017-12-01 (×7): 80 mg via INTRAVENOUS
  Filled 2017-11-28 (×7): qty 8

## 2017-11-28 NOTE — Progress Notes (Signed)
Patient refused bed alarm. Will continue to monitor patient. 

## 2017-11-28 NOTE — Consult Note (Addendum)
Advanced Heart Failure Team Consult Note   Primary Physician: Lin Landsman, MD PCP-Cardiologist:  Dr Percival Spanish Reason for Consultation:Heart Failure   HPI:    [place] is seen today for evaluation of heart failure at the request of Dr Eliseo Squires.    Lisa Hodges is a 32 year old with a history of DMI, HTN, CKD Stage III, and chronic systolic heart failure.   Diagnosed with heart failure 10/2016 during hospital admit. She had been treated for pneumonia prior to admit. Diuresed with IV lasix and transitioned to lasix 40 mg po daily, hydralazine 10 mg tid, and imdur 15 mg daily. Discharge creatinine 1.89.   Last seen by Dr Percival Spanish in May 2018. At that time lasix was increased to 60 mg daily. Weight was 191 pounds.   She has been out of insulin and lasix for about 1 month. Prior to admit she ran out of insulin about 1 month. Increased dyspnea with exertion. Says she drinks lots of fluids at home. Denies chest pain. She does not drink alcohol or smoke. Lives with her boyfriend and disabled daughter. Her daughter is wheel chair bound.   Presented to [facility] ED with increased dyspnea and leg edema. She had been out of lasix and insulin. CXR with pulmonary edema. Pertinent admission labs include: creatinine 4.94, troponin 0.03, hgb 8.4, bnp 841.  She has been diuresing with IV lasix. Todays creatinine is 5. ECHO as noted below.   11/26/2017 Echo  Left ventricle: The cavity size was normal. Systolic function was   moderately reduced. The estimated ejection fraction was in the   range of 35% to 40%. Moderate diffuse hypokinesis with regional   variations. Features are consistent with a pseudonormal left   ventricular filling pattern, with concomitant abnormal relaxation   and increased filling pressure (grade 2 diastolic dysfunction).   Doppler parameters are consistent with high ventricular filling   pressure. - Aortic valve: There was trivial regurgitation. - Mitral valve: There was mild  regurgitation. - Pulmonary arteries: PA peak pressure: 38 mm Hg (S). - Pericardium, extracardiac: A small to moderate, free-flowing   pericardial effusion was identified circumferential to the heart.   The fluid had no internal echoes.  10/2016  - Left ventricle: The cavity size was normal. Wall thickness was   increased in a pattern of mild LVH. Systolic function was   moderately to severely reduced. The estimated ejection fraction   was in the range of 30% to 35%. Diffuse hypokinesis. - Aortic valve: There was mild regurgitation. - Mitral valve: There was mild regurgitation. - Pericardium, extracardiac: A small pericardial effusion was   identified.  Lexi Scan 2018  Low risk study   Review of Systems: [y] = yes, _0  = no   General: Weight gain [Y ]; Weight loss _1 ; Anorexia _2 ; Fatigue [ Y]; Fever _3 ; Chills _4 ; Weakness _5   Cardiac: Chest pain/pressure _6 ; Resting SOB _7 ; Exertional SOB [ Y]; Orthopnea _8 ; Pedal Edema [Y ]; Palpitations _9 ; Syncope _10 ; Presyncope _11 ; Paroxysmal nocturnal dyspnea_12   Pulmonary: Cough _13 ; Wheezing_14 ; Hemoptysis_15 ; Sputum _16 ; Snoring _17   GI: Vomiting_18 ; Dysphagia_19 ; Melena_20 ; Hematochezia _21 ; Heartburn_22 ; Abdominal pain _23 ; Constipation _24 ; Diarrhea _25 ; BRBPR _26   GU: Hematuria_27 ; Dysuria _28 ; Nocturia_29   Vascular: Pain in legs with walking _30 ; Pain in feet with lying flat _31 ; Non-healing  sores _0 ; Stroke _1 ; TIA _2 ; Slurred speech _3 ;  Neuro: Headaches_4 ; Vertigo_5 ; Seizures_6 ; Paresthesias_7 ;Blurred vision _8 ; Diplopia _9 ; Vision changes _10   Ortho/Skin: Arthritis _11 ; Joint pain _12 ; Muscle pain _13 ; Joint swelling _14 ; Back Pain _15 ; Rash _16   Psych: Depression_17 ; Anxiety_18   Heme: Bleeding problems _19 ; Clotting disorders _20 ; Anemia _21   Endocrine: Diabetes [Y ]; Thyroid dysfunction_22   Home Medications Prior to Admission medications   Medication Sig Start Date End Date Taking? Authorizing Provider   carvedilol (COREG) 6.25 MG tablet Take 1 tablet (6.25 mg total) by mouth 2 (two) times daily with a meal. 06/14/17  Yes Minus Breeding, MD  furosemide (LASIX) 40 MG tablet Take 60 mg in morning and 40 mg in the evening Patient taking differently: Take 40-60 mg by mouth See admin instructions. Take 60 mg in morning and 40 mg in the evening  08/20/17  Yes Hochrein, Jeneen Rinks, MD  hydrALAZINE (APRESOLINE) 10 MG tablet Take 1 tablet (10 mg total) by mouth 3 (three) times daily. 06/14/17  Yes Minus Breeding, MD  insulin aspart protamine- aspart (NOVOLOG MIX 70/30) (70-30) 100 UNIT/ML injection Inject 0.08 mLs (8 Units total) into the skin 2 (two) times daily with a meal. 11/22/16 11/26/24 Yes Rowe Clack, MD  isosorbide mononitrate (IMDUR) 30 MG 24 hr tablet Take 1 tablet (30 mg total) by mouth daily. 01/29/17  Yes Minus Breeding, MD  ACCU-CHEK SOFTCLIX LANCETS lancets Use as instructed 12/04/16   Rowe Clack, MD  Blood Glucose Monitoring Suppl (ACCU-CHEK AVIVA PLUS) w/Device KIT Use as directed 12/04/16   Rowe Clack, MD  glucose blood (ACCU-CHEK AVIVA PLUS) test strip Use as instructed 12/04/16   Rowe Clack, MD  Insulin Syringe-Needle U-100 (INSULIN SYRINGE .5CC/31GX5/16") 31G X 5/16" 0.5 ML MISC USE AS DIRECTED. 06/01/17   Rica Mast, RPH-CPP  Lancets Misc. (ACCU-CHEK SOFTCLIX LANCET DEV) KIT Use as directed 12/04/16   Rowe Clack, MD    Past Medical History: Past Medical History:  Diagnosis Date  . CKD (chronic kidney disease)   . Diabetes mellitus without complication ([place])   . Hypertension   . Systolic CHF [facility])     Past Surgical History: Past Surgical History:  Procedure Laterality Date  . CESAREAN SECTION      Family History: Family History  Problem Relation Age of Onset  . Diabetes Mother   . Kidney failure Mother     Social History: Social History   Socioeconomic History  . Marital status: Single    Spouse name: None  . Number of  children: None  . Years of education: None  . Highest education level: None  Social Needs  . Financial resource strain: None  . Food insecurity - worry: None  . Food insecurity - inability: None  . Transportation needs - medical: None  . Transportation needs - non-medical: None  Occupational History  . None  Tobacco Use  . Smoking status: Never Smoker  . Smokeless tobacco: Never Used  Substance and Sexual Activity  . Alcohol use: No  . Drug use: No  . Sexual activity: None  Other Topics Concern  . None  Social History Narrative  . None    Allergies:  No Known Allergies  Objective:    Vital Signs:   Temp:  [97.9 F (36.6 C)-98.1 F (36.7 C)] 98 F (36.7 C) (03/06 0814) Pulse Rate:  [  89-99] 99 (03/06 0814) Resp:  [16-20] 20 (03/06 0814) BP: (118-154)/(70-111) 154/111 (03/06 0814) SpO2:  [97 %-100 %] 99 % (03/06 0814) Weight:  [208 lb 4.8 oz (94.5 kg)] 208 lb 4.8 oz (94.5 kg) (03/06 0343) Last BM Date: 11/25/17  Weight change: Filed Weights   11/26/17 1412 11/27/17 0435 11/28/17 0343  Weight: 206 lb 4.8 oz (93.6 kg) 206 lb 4.8 oz (93.6 kg) 208 lb 4.8 oz (94.5 kg)    Intake/Output:   Intake/Output Summary (Last 24 hours) at 11/28/2017 1019 Last data filed at 11/28/2017 0859 Gross per 24 hour  Intake 870 ml  Output 1400 ml  Net -530 ml      Physical Exam    General:  No resp difficulty HEENT: normal Neck: supple. JVP to jaw  . Carotids 2+ bilat; no bruits. No lymphadenopathy or thyromegaly appreciated. Cor: PMI nondisplaced. Regular rate & rhythm. No rubs, gallops or murmurs. Lungs: clear Abdomen: soft, nontender, nondistended. No hepatosplenomegaly. No bruits or masses. Good bowel sounds. Extremities: no cyanosis, clubbing, rash, R and LLE 1+  edema Neuro: alert & orientedx3, cranial nerves grossly intact. moves all 4 extremities w/o difficulty. Affect pleasant   Telemetry   NSR 80-100s   EKG    NSR 85 bpm . Low volts  Labs   Basic Metabolic  Panel: Recent Labs  Lab 11/26/17 0200 11/27/17 0502 11/28/17 0547  NA 139 141 139  K 3.8 3.7 3.9  CL 109 110 108  CO2 20* 20* 21*  GLUCOSE 203* 125* 146*  BUN 52* 51* 51*  CREATININE 4.94* 5.01* 5.03*  CALCIUM 6.7* 7.0* 6.8*    Liver Function Tests: No results for input(s): AST, ALT, ALKPHOS, BILITOT, PROT, ALBUMIN in the last 168 hours. No results for input(s): LIPASE, AMYLASE in the last 168 hours. No results for input(s): AMMONIA in the last 168 hours.  CBC: Recent Labs  Lab 11/26/17 0200 11/28/17 0547  WBC 7.3 6.1  HGB 8.4* 8.3*  HCT 27.9* 27.1*  MCV 63.8* 62.9*  PLT 368 361    Cardiac Enzymes: Recent Labs  Lab 11/26/17 0200  TROPONINI <0.03    BNP: BNP (last 3 results) Recent Labs    11/26/17 0241  BNP 841.6*    ProBNP (last 3 results) No results for input(s): PROBNP in the last 8760 hours.   CBG: Recent Labs  Lab 11/27/17 0722 11/27/17 1117 11/27/17 1622 11/27/17 2127 11/28/17 0732  GLUCAP 121* 159* 137* 174* 145*    Coagulation Studies: No results for input(s): LABPROT, INR in the last 72 hours.   Imaging    No results found.   Medications:     Current Medications: . carvedilol  6.25 mg Oral BID WC  . furosemide  60 mg Intravenous Q12H  . hydrALAZINE  10 mg Oral TID  . insulin aspart  0-5 Units Subcutaneous QHS  . insulin aspart  0-9 Units Subcutaneous TID WC  . isosorbide mononitrate  30 mg Oral Daily  . sodium chloride flush  3 mL Intravenous Q12H     Infusions: . sodium chloride    . calcium gluconate         Patient Profile  Lisa Hodges is a 32 year old with a history of DMI, HTN, CKD Stage III, and chronic systolic heart failure diagnosed in 2018. Presumed NICM.   Admitted with volume overload.   Assessment/Plan  1. A/C Systolic Heart Failure- Presumed NICM--HTN/Viral  Diagnosed in 2018. No cath because creatinine was >1.5. Lexiscan was low  risk 2018.   ECHO 3/4 with EF 35-40% and small-moderate  pericardial effusion. Unable to obtain cMRI with contrast with elevated creatinine.  Volume status elevated. Increase lasix to 80 mg twice a day. Watch renal function closely.  Continue carvedilol 6.25 mg twice a day Increase hydralazine to 25 gm tid + 30 mg imdur daily.  No arb/spiro/dig with CKD.  Low volts on EKG- check SPEP UPEP.  I discussed the importance of preventing pregnancy. Will need f/u with PCP/GYN  2. Acute/Chronic CKD Creatinine on admit 4.9.  Has not been on ace/arb/spiro  Renal US without hydronephrosis Has not been taking NSAIDs.   3. HTN  Increase hydralazine as above.   4. Insulin dependent DM Prior to admit ran out of insulin and started metformin. Creatinine > 1.5 so this was not restarted.  Continue insulin per primary team.  Hgb A1C 7.6  5. Anemia  Iron sats low.  Will need feraheme   Medication concerns reviewed with patient and pharmacy team. Barriers identified: Noncompliance.   Length of Stay: 2  Darrick Grinder, NP  11/28/2017, 10:19 AM  Advanced Heart Failure Team Pager [phone] (M-F; 7a - 4p)  Please contact [facility] for night-coverage after hours (4p -7a ) and weekends on [email or web address]  Patient seen with NP, agree with the above note.  Patient was admitted with worsening dyspnea over several days prior to admission.  She has not been particularly compliant with followup and has been taking escalating doses of the Lasix that she has at home on her own.  She also ran out of her insulin and started taking metformin.  On admission, she was volume overloaded with creatinine up to 5 (most recent prior in 2/18 was around 1.9).    On exam, she is volume overloaded with JVP 12+ cm.  Mild crackles on lung exam.  Heart reg S1S2 without murmur.  1+ ankle edema.   1. Acute on chronic systolic CHF: Cardiomyopathy of uncertain etiology.  Has RFs for CAD with type II diabetes with onset at age 61 and HTN. HIV negative. However, never had cath due to elevated  creatinine.  Also could be related to prior viral myocarditis or even long-standing diabetes or HTN.  On exam, she is volume overloaded but this is complicated by presumed AKI with creatinine up to 5.  - Lasix 80 mg IV bid for now.  - Coreg 6.25 mg bid. - Bidil 1 tab tid.  - With low voltage ECG, will send SPEP/UPEP.  - With her cardiac meds, she will need to be on some form of birth control.  2. 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  Cause of AKI uncertain, she had been taking escalating doses of Lasix at home, ?development of ATN.  However, she still remains very volume overloaded so she did not dehydrate herself.  Renal US unremarkable.  Suspect underlying diabetic nephropathy.   - Follow creatinine closely with diuresis.  - Will likely need to involve renal service.  3. HTN: Coreg and Bidil. 4. DM: Started as type II on po meds at age 68. Now supposed to be on insulin but was out for several months and went back to metformin.  Surprisingly, hgbA1c was not particularly high.  5. Anemia: Fe deficient, may be due to heavy menses.   - Will give feraheme.   Loralie Champagne 11/28/2017 12:31 PM

## 2017-11-28 NOTE — Progress Notes (Signed)
CCMD called that all pt tele leads were off, went in to check on pt and she is in the bathroom taking a shower, when RN asked pt while she took of the leads without asking, pt said she wanted to take a shower that's while she took it off, and she didn't know she had to get MD order to take a shower. Pt was educated.

## 2017-11-28 NOTE — Progress Notes (Signed)
Patient seems to be drinking more than should. Educated about limiting her fluid intake as well as given the Heart failure booklet with education.

## 2017-11-28 NOTE — Progress Notes (Signed)
PROGRESS NOTE    Lisa Hodges  ZOX:[id number] DOB: 1986/03/21 DOA: 11/26/2017 PCP: Leilani Able, MD   Outpatient Specialists:    Brief Narrative:  Lisa Hodges is a 32 y.o. female with medical history significant for insulin-dependent diabetes mellitus, hypertension, chronic kidney disease stage III, and chronic systolic CHF, now presenting to the emergency department for evaluation of shortness of breath and swelling.  Patient reports that she ran out of her insulin approximately a month ago and ran out of her Lasix a couple weeks ago, and has noted the insidious development of shortness of breath and leg swelling over that interval.  She reports worsening orthopnea. Despite IV lasix, CR has not lowered and patient still markedly overloaded.  Have consulted Cardiology and Renal.       Assessment & Plan:   Principal Problem:   Acute on chronic systolic CHF (congestive heart failure) (HCC) Active Problems:   Insulin dependent diabetes mellitus with complications (HCC)   Hypertension   Acute on chronic renal failure (HCC)   Microcytic anemia   Hypertensive urgency   AKI (acute kidney injury) (HCC)   Acute on chronic systolic CHF  - Presents with progressive dyspnea, orthopnea, peripheral edema, and wt gain  -story changes from running out of lasix to taking more than prescribed by cardiology due to swelling - There is vascular congestion on CXR with focal opacity possibly representing infection, but no fever, leukocytosis, or productive cough - Echo from February 2018 with EF 30-35%, mild LVH, no WMA's, diffuse HK, mild AR, and mild MR -- echo now similar - Stress test last Spring was low-risk study  - continue IV lasix-- BID -strict I/os- down almost 2L -daily weights  Acute renal failure superimposed on CKD stage III  - SCr is 4.94 on admission, up from 1.89 one year ago  - Has not been on ACE/ARB  - renal US normal -? New baseline- nephrology consult -FENA says postrenal  but nothing to indicate a blockage  Hypertension with hypertensive urgency  - BP 160/100 in ED  - Continue Coreg and hydralazine   -improved   Insulin-dependent DM  - A1c was 10.1% one year ago now 7.6 -was only taking metformin at home (ran out of insulin) -SSI for now -carb mod diet  Microcytic anemia  - Hgb is 8.4 on admission with MCV of 64 - Both indices similar to one year ago  - No bleeding  - Likely chronic disease -Fe replacement     DVT prophylaxis:  SCD's  Code Status: Full Code   Family Communication:   Disposition Plan:     Consultants:      Subjective: Feels like her legs are less swollen  Objective: Vitals:   11/27/17 2324 11/28/17 0343 11/28/17 0814 11/28/17 1130  BP: (!) 147/87 (!) 152/92 (!) 154/111 122/72  Pulse: 97 95 99 92  Resp: 18 18 20 18   Temp: 98.1 F (36.7 C) 98.1 F (36.7 C) 98 F (36.7 C) 98.3 F (36.8 C)  TempSrc: Oral Oral Oral Oral  SpO2: 99% 97% 99% 99%  Weight:  94.5 kg (208 lb 4.8 oz)    Height:        Intake/Output Summary (Last 24 hours) at 11/28/2017 1312 Last data filed at 11/28/2017 0859 Gross per 24 hour  Intake 480 ml  Output 1000 ml  Net -520 ml   Filed Weights   11/26/17 1412 11/27/17 0435 11/28/17 0343  Weight: 93.6 kg (206 lb 4.8 oz) 93.6 kg (206 lb  4.8 oz) 94.5 kg (208 lb 4.8 oz)    Examination:  General exam: in bed, NAD Respiratory system: no wheezing, no increased work of breathing Cardiovascular system: rrr, +edema LE-- looser Gastrointestinal system: +BS, soft Central nervous system: alert Extremities: moves all 4 ext    Data Reviewed: I have personally reviewed following labs and imaging studies  CBC: Recent Labs  Lab 11/26/17 0200 11/28/17 0547  WBC 7.3 6.1  HGB 8.4* 8.3*  HCT 27.9* 27.1*  MCV 63.8* 62.9*  PLT 368 361   Basic Metabolic Panel: Recent Labs  Lab 11/26/17 0200 11/27/17 0502 11/28/17 0547  NA 139 141 139  K 3.8 3.7 3.9  CL 109 110 108  CO2 20* 20*  21*  GLUCOSE 203* 125* 146*  BUN 52* 51* 51*  CREATININE 4.94* 5.01* 5.03*  CALCIUM 6.7* 7.0* 6.8*   GFR: Estimated Creatinine Clearance: 15.3 mL/min (A) (by C-G formula based on SCr of 5.03 mg/dL (H)). Liver Function Tests: No results for input(s): AST, ALT, ALKPHOS, BILITOT, PROT, ALBUMIN in the last 168 hours. No results for input(s): LIPASE, AMYLASE in the last 168 hours. No results for input(s): AMMONIA in the last 168 hours. Coagulation Profile: No results for input(s): INR, PROTIME in the last 168 hours. Cardiac Enzymes: Recent Labs  Lab 11/26/17 0200  TROPONINI <0.03   BNP (last 3 results) No results for input(s): PROBNP in the last 8760 hours. HbA1C: Recent Labs    11/26/17 0617  HGBA1C 7.6*   CBG: Recent Labs  Lab 11/27/17 1117 11/27/17 1622 11/27/17 2127 11/28/17 0732 11/28/17 1126  GLUCAP 159* 137* 174* 145* 166*   Lipid Profile: No results for input(s): CHOL, HDL, LDLCALC, TRIG, CHOLHDL, LDLDIRECT in the last 72 hours. Thyroid Function Tests: No results for input(s): TSH, T4TOTAL, FREET4, T3FREE, THYROIDAB in the last 72 hours. Anemia Panel: Recent Labs    11/26/17 0622 11/26/17 0623  VITAMINB12 542  --   FOLATE  --  10.4  FERRITIN 9*  --   TIBC 245*  --   IRON 19*  --   RETICCTPCT 2.1  --    Urine analysis:    Component Value Date/Time   COLORURINE STRAW (A) 10/28/2016 1806   APPEARANCEUR CLEAR 10/28/2016 1806   LABSPEC 1.006 10/28/2016 1806   PHURINE 6.0 10/28/2016 1806   GLUCOSEU 50 (A) 10/28/2016 1806   HGBUR SMALL (A) 10/28/2016 1806   BILIRUBINUR NEGATIVE 10/28/2016 1806   KETONESUR NEGATIVE 10/28/2016 1806   PROTEINUR 100 (A) 10/28/2016 1806   UROBILINOGEN 0.2 09/01/2014 2124   NITRITE NEGATIVE 10/28/2016 1806   LEUKOCYTESUR NEGATIVE 10/28/2016 1806     )No results found for this or any previous visit (from the past 240 hour(s)).    Anti-infectives (From admission, onward)   None       Radiology Studies: No results  found.      Scheduled Meds: . carvedilol  6.25 mg Oral BID WC  . furosemide  80 mg Intravenous BID  . insulin aspart  0-5 Units Subcutaneous QHS  . insulin aspart  0-9 Units Subcutaneous TID WC  . isosorbide-hydrALAZINE  1 tablet Oral TID  . sodium chloride flush  3 mL Intravenous Q12H   Continuous Infusions: . sodium chloride    . calcium gluconate    . [START ON 12/01/2017] ferumoxytol       LOS: 2 days    Time spent: 25 min    Joseph Art, DO [facility] Pager [phone]  If 7PM-7AM, please  contact night-coverage [email or web address] Password [email or web address] 11/28/2017, 1:12 PM

## 2017-11-28 NOTE — Plan of Care (Signed)
Nutrition Education Note  RD consulted for nutrition education regarding CHF and DM.   Lab Results  Component Value Date   HGBA1C 7.6 (H) 11/26/2017    Case discussed with RN prior to visit, who reports pt was increasing lasix dose due to increased fluid consumption.   Spoke with pt at bedside, who reports that she has periods of good compliance and control with DM and heart failure regimen. She reports that she was previously documenting weight and blood sugar readings daily as well as being compliant with MD appointments and was doing very well. However, pt "fell off the wagon" and stopped monitoring and doing to MD appointments due to fear "of what the scale and MD would say". Pt able to teach back to this RD basics of DM and CHF diet. Most of discussion was providing pt with encouragement and motivation to continue with consistent self-management behaviors.   RD provided "Heart Healthy, Consistent Carbohydrate Nutrition Therapy" handout from the [employer] of [employer]. Reviewed patient's dietary recall. Provided examples on ways to decrease sodium intake in diet. Discouraged intake of processed foods and use of salt shaker. Encouraged fresh fruits and vegetables as well as whole grain sources of carbohydrates to maximize fiber intake.   RD discussed why it is important for patient to adhere to diet recommendations, and emphasized the role of fluids, foods to avoid, and importance of weighing self daily.  Discussed different food groups and their effects on blood sugar, emphasizing carbohydrate-containing foods. Provided list of carbohydrates and recommended serving sizes of common foods.  Discussed importance of controlled and consistent carbohydrate intake throughout the day. Provided examples of ways to balance meals/snacks and encouraged intake of high-fiber, whole grain complex carbohydrates. Teach back method used.  Expect fair compliance.  Body mass index is 45.08 kg/m.  Pt meets criteria for extreme obesity, class III based on current BMI.  Current diet order is Heart Healthyy/ Carb Modified with 1.5 L fluid restriction, patient is consuming approximately 100% of meals at this time. Labs and medications reviewed. No further nutrition interventions warranted at this time. RD contact information provided. If additional nutrition issues arise, please re-consult RD.   Trayon Krantz A. Mayford Knife, RD, LDN, CDE Pager: [phone] After hours Pager: [phone]

## 2017-11-28 NOTE — Consult Note (Signed)
[facility] Consultation Note Requesting Physician:  Dr. Eulogio Bear Reason for Consult:  AKI on CKD  HPI: The patient is a 32 y.o. year-old woman PMH DM, HTN, CKD 3 (no labs since 10/2016 creatinine around 1.9 at that time GFR 30's) and chronic systolic HF (EF 01-75% 09/256)  Ran out of lasix (was taking extra doses d/t fluid xs then ran out completely) and insulin some time ago (story varies). Presented to the ED 11/26/17 c/o  PND, orthopnea, exertional dyspnea, elevated BP, creatinine 4.9, anemia with very low tsat, CXR vascular congestion.  Last weight by cards 2018  191lb, current 208. Rec'd a few 60 mg doses lasix 1.4 liters out yesterday. Seen earlier today by cards and lasix ^ 80 IV BID. We are called because creatinine remains around 5. No UA done (+ dip protein 10/2016), renal US 10.2, 10.4 no hydro.    Creatinine, Ser  Date/Time Value Ref Range Status  11/28/2017 05:47 AM 5.03 (H) 0.44 - 1.00 mg/dL Final  11/27/2017 05:02 AM 5.01 (H) 0.44 - 1.00 mg/dL Final  11/26/2017 02:00 AM 4.94 (H) 0.44 - 1.00 mg/dL Final  11/08/2016 03:30 AM 1.89 (H) 0.44 - 1.00 mg/dL Final  11/07/2016 04:28 AM 1.85 (H) 0.44 - 1.00 mg/dL Final  11/06/2016 09:12 AM 1.91 (H) 0.44 - 1.00 mg/dL Final  11/05/2016 06:52 AM 1.77 (H) 0.44 - 1.00 mg/dL Final  11/04/2016 04:16 AM 1.70 (H) 0.44 - 1.00 mg/dL Final  11/03/2016 03:53 AM 1.76 (H) 0.44 - 1.00 mg/dL Final  11/02/2016 05:56 AM 1.75 (H) 0.44 - 1.00 mg/dL Final  11/01/2016 05:05 AM 1.82 (H) 0.44 - 1.00 mg/dL Final  10/31/2016 10:51 AM 1.84 (H) 0.44 - 1.00 mg/dL Final  10/31/2016 10:11 AM 1.83 (H) 0.44 - 1.00 mg/dL Final  10/30/2016 03:37 AM 1.97 (H) 0.44 - 1.00 mg/dL Final  10/29/2016 04:49 AM 1.74 (H) 0.44 - 1.00 mg/dL Final  10/28/2016 02:42 AM 1.65 (H) 0.44 - 1.00 mg/dL Final  10/27/2016 02:58 AM 1.66 (H) 0.44 - 1.00 mg/dL Final  10/26/2016 08:55 PM 1.71 (H) 0.44 - 1.00 mg/dL Final  10/26/2016 03:07 AM 1.80 (H) 0.44 - 1.00 mg/dL Final  10/12/2016 04:30 PM 1.71 (H)  0.44 - 1.00 mg/dL Final  10/26/2015 03:03 PM 1.10 (H) 0.44 - 1.00 mg/dL Final  10/12/2015 02:02 PM 1.06 (H) 0.44 - 1.00 mg/dL Final  09/01/2014 08:03 PM 0.81 0.50 - 1.10 mg/dL Final    Past Medical History:  Diagnosis Date  . CKD (chronic kidney disease)   . Diabetes mellitus without complication ([place])   . Hypertension   . Systolic CHF [facility])       Past Surgical History:  Procedure Laterality Date  . CESAREAN SECTION      Family History  Problem Relation Age of Onset  . Diabetes Mother   . Kidney failure Mother   Kidney failure in grandmother as well  Social History:  reports that  has never smoked. she has never used smokeless tobacco. She reports that she does not drink alcohol or use drugs.  Allergies: No Known Allergies  Home medications: Prior to Admission medications   Medication Sig Start Date End Date Taking? Authorizing Provider  carvedilol (COREG) 6.25 MG tablet Take 1 tablet (6.25 mg total) by mouth 2 (two) times daily with a meal. 06/14/17  Yes Minus Breeding, MD  furosemide (LASIX) 40 MG tablet Take 60 mg in morning and 40 mg in the evening Patient taking differently: Take 40-60 mg by mouth See admin instructions. Take  60 mg in morning and 40 mg in the evening  08/20/17  Yes Hochrein, Jeneen Rinks, MD  hydrALAZINE (APRESOLINE) 10 MG tablet Take 1 tablet (10 mg total) by mouth 3 (three) times daily. 06/14/17  Yes Minus Breeding, MD  insulin aspart protamine- aspart (NOVOLOG MIX 70/30) (70-30) 100 UNIT/ML injection Inject 0.08 mLs (8 Units total) into the skin 2 (two) times daily with a meal. 11/22/16 11/26/24 Yes Rowe Clack, MD  isosorbide mononitrate (IMDUR) 30 MG 24 hr tablet Take 1 tablet (30 mg total) by mouth daily. 01/29/17  Yes Minus Breeding, MD  ACCU-CHEK SOFTCLIX LANCETS lancets Use as instructed 12/04/16   Rowe Clack, MD  Blood Glucose Monitoring Suppl (ACCU-CHEK AVIVA PLUS) w/Device KIT Use as directed 12/04/16   Rowe Clack, MD  glucose  blood (ACCU-CHEK AVIVA PLUS) test strip Use as instructed 12/04/16   Rowe Clack, MD  Insulin Syringe-Needle U-100 (INSULIN SYRINGE .5CC/31GX5/16") 31G X 5/16" 0.5 ML MISC USE AS DIRECTED. 06/01/17   Rica Mast, RPH-CPP  Lancets Misc. (ACCU-CHEK SOFTCLIX LANCET DEV) KIT Use as directed 12/04/16   Rowe Clack, MD    Inpatient medications: . carvedilol  6.25 mg Oral BID WC  . furosemide  80 mg Intravenous BID  . insulin aspart  0-5 Units Subcutaneous QHS  . insulin aspart  0-9 Units Subcutaneous TID WC  . isosorbide-hydrALAZINE  1 tablet Oral TID  . sodium chloride flush  3 mL Intravenous Q12H    Review of Systems See HPI.    Physical Exam:  Blood pressure (!) 143/82, pulse 94, temperature 98.3 F (36.8 C), temperature source Oral, resp. rate 18, height 4' 9"  (1.448 m), weight 94.5 kg (208 lb 4.8 oz), last menstrual period 10/29/2017, SpO2 96 %.  Soft spoken AAF NAD VS noted JVD to jaw angle Basilar crackles, no wheezes S1S2 No S3 or murmur. Heart sounds somewhat distant Abdomen obese, no focal tenderness 2+ pitting edema to knees, 1+ thighs Neuro: alert, Ox3, no focal deficit   Recent Labs  Lab 11/26/17 0200 11/27/17 0502 11/28/17 0547  NA 139 141 139  K 3.8 3.7 3.9  CL 109 110 108  CO2 20* 20* 21*  GLUCOSE 203* 125* 146*  BUN 52* 51* 51*  CREATININE 4.94* 5.01* 5.03*  CALCIUM 6.7* 7.0* 6.8*    Recent Labs  Lab 11/26/17 0200 11/28/17 0547  WBC 7.3 6.1  HGB 8.4* 8.3*  HCT 27.9* 27.1*  MCV 63.8* 62.9*  PLT 368 361    Recent Labs  Lab 11/26/17 0200  TROPONINI <0.03   CBG: Recent Labs  Lab 11/27/17 1117 11/27/17 1622 11/27/17 2127 11/28/17 0732 11/28/17 1126  GLUCAP 159* 137* 174* 145* 166*     Recent Labs  Lab 11/26/17 0622  IRON 19*  TIBC 245*  FERRITIN 9*   Dg Chest 2 View  Result Date: 11/26/2017 CLINICAL DATA:  Shortness of breath EXAM: CHEST  2 VIEW COMPARISON:  10/26/2016, 10/12/2016 FINDINGS: Cardiomegaly with mild  central vascular congestion. Streaky atelectasis at the left lung base. Small focal opacity in the left mid lung, new compared to prior. No pneumothorax. IMPRESSION: 1. Oval focal opacity in the left mid lung, new compared to prior may represent focus of infection 2. Similar appearance of cardiomegaly with mild vascular congestion. 3. Partial but incomplete clearing of left basilar airspace disease. Electronically Signed   By: Donavan Foil M.D.   On: 11/26/2017 02:45   US Renal  Result Date: 11/26/2017 CLINICAL DATA:  32 year old diabetic hypertensive female with renal failure today. Initial encounter. EXAM: RENAL / URINARY TRACT ULTRASOUND COMPLETE COMPARISON:  10/28/2016 renal sonogram. FINDINGS: Right Kidney: Length: 10.2 cm. Echogenicity within normal limits. No mass or hydronephrosis visualized. Left Kidney: Length: 10.4 cm. Echogenicity within normal limits. No mass or hydronephrosis visualized. Bladder: Appears normal for degree of bladder distention. Small bilateral pleural effusions. Uterine fibroids incompletely evaluated. IMPRESSION: Negative renal sonogram. Small bilateral pleural effusions. Uterine fibroids incompletely assessed. Electronically Signed   By: Genia Del M.D.   On: 11/26/2017 07:02   Background: 32 y.o. year-old woman PMH DM, HTN, CKD 3 (no labs since 10/2016 creatinine around 1.9 at that time GFR 30's) and chronic systolic HF (EF 02-11% 09/5518)  Was taking extra lasix 2/2 edema/SOB, then ran out totally and had none for some time. (Also ran out of insulin) Presented to the ED 11/26/17 c/o  PND, orthopnea, exertional dyspne. Had elevated BP, creatinine 4.9, anemia with very low tsat, CXR vascular congestion.  Last weight by cards 2018  191lb, current 208. Rec'd a few 60 mg doses lasix 1.4 liters out yesterday. Seen earlier today by cards and lasix ^ 80 IV BID. Asked to see because of creatinine of 5. No UA this adm (+ dip protein 10/2016; never quantitated), renal US 10.2, 10.4 no  hydro.    Assessment/Recommendations  1. Renal failure - presume  AKI on CKD (cardiorenal vs uncontrolled DM/HTN)  but cannot exclude natural progression of proteinuric CKD 2/2 DM. Need SPEP, UPEP, UPC, PTH. Trend labs, exam. If not seeing any improvement w/time/vol and DM/HTN mgmt, may presume progression underlying disease. (Mother and grandmother were both on dialysis). Will need ongoing renal followup as outpt.  2. Acute on chronic systolic heart failure - EF 35-40%. Vol overloaded. Agree with diuretic ^ as recommended by cards. Avoid ACE/ARB/spironolactone 3. Uncontrolled HTN - cards has already adjusted meds today 4. Anemia with Fe def - agree with Feraheme as ordered. Once Fe def corrected if Hb remains <10 then Aranesp or EPO.  5. IDDM - out of insulin for some unclear time period. A1C 7.6 Per primary team. Avoid further use metformin at current GFR   Jamal Maes,  MD [facility] [phone] pager 11/28/2017, 2:26 PM

## 2017-11-29 DIAGNOSIS — Z9114 Patient's other noncompliance with medication regimen: Secondary | ICD-10-CM

## 2017-11-29 LAB — RENAL FUNCTION PANEL
ALBUMIN: 1.4 g/dL — AB (ref 3.5–5.0)
Anion gap: 10 (ref 5–15)
BUN: 54 mg/dL — AB (ref 6–20)
CALCIUM: 7 mg/dL — AB (ref 8.9–10.3)
CO2: 20 mmol/L — ABNORMAL LOW (ref 22–32)
Chloride: 109 mmol/L (ref 101–111)
Creatinine, Ser: 5.24 mg/dL — ABNORMAL HIGH (ref 0.44–1.00)
GFR calc Af Amer: 11 mL/min — ABNORMAL LOW (ref >60)
GFR calc non Af Amer: 10 mL/min — ABNORMAL LOW (ref >60)
GLUCOSE: 108 mg/dL — AB (ref 65–99)
PHOSPHORUS: 5.7 mg/dL — AB (ref 2.5–4.6)
Potassium: 3.9 mmol/L (ref 3.5–5.1)
SODIUM: 139 mmol/L (ref 135–145)

## 2017-11-29 LAB — PROTEIN ELECTROPHORESIS, SERUM
A/G RATIO SPE: 0.6 — AB (ref 0.7–1.7)
Albumin ELP: 1.6 g/dL — ABNORMAL LOW (ref 2.9–4.4)
Alpha-1-Globulin: 0.2 g/dL (ref 0.0–0.4)
Alpha-2-Globulin: 1.1 g/dL — ABNORMAL HIGH (ref 0.4–1.0)
BETA GLOBULIN: 0.9 g/dL (ref 0.7–1.3)
GAMMA GLOBULIN: 0.6 g/dL (ref 0.4–1.8)
Globulin, Total: 2.8 g/dL (ref 2.2–3.9)
Total Protein ELP: 4.4 g/dL — ABNORMAL LOW (ref 6.0–8.5)

## 2017-11-29 LAB — URINALYSIS, ROUTINE W REFLEX MICROSCOPIC
BILIRUBIN URINE: NEGATIVE
Glucose, UA: 150 mg/dL — AB
Ketones, ur: NEGATIVE mg/dL
Leukocytes, UA: NEGATIVE
Nitrite: NEGATIVE
SPECIFIC GRAVITY, URINE: 1.011 (ref 1.005–1.030)
pH: 6 (ref 5.0–8.0)

## 2017-11-29 LAB — CBC
HCT: 24.6 % — ABNORMAL LOW (ref 36.0–46.0)
HEMOGLOBIN: 7.5 g/dL — AB (ref 12.0–15.0)
MCH: 19.1 pg — ABNORMAL LOW (ref 26.0–34.0)
MCHC: 30.5 g/dL (ref 30.0–36.0)
MCV: 62.6 fL — ABNORMAL LOW (ref 78.0–100.0)
Platelets: 356 10*3/uL (ref 150–400)
RBC: 3.93 MIL/uL (ref 3.87–5.11)
RDW: 16 % — AB (ref 11.5–15.5)
WBC: 6.2 10*3/uL (ref 4.0–10.5)

## 2017-11-29 LAB — GLUCOSE, CAPILLARY
GLUCOSE-CAPILLARY: 150 mg/dL — AB (ref 65–99)
GLUCOSE-CAPILLARY: 135 mg/dL — AB (ref 65–99)
Glucose-Capillary: 110 mg/dL — ABNORMAL HIGH (ref 65–99)

## 2017-11-29 MED ORDER — SEVELAMER CARBONATE 800 MG PO TABS
800.0000 mg | ORAL_TABLET | Freq: Three times a day (TID) | ORAL | Status: DC
  Administered 2017-11-29 – 2017-12-02 (×10): 800 mg via ORAL
  Filled 2017-11-29 (×9): qty 1

## 2017-11-29 MED ORDER — DARBEPOETIN ALFA 200 MCG/0.4ML IJ SOSY
200.0000 ug | PREFILLED_SYRINGE | INTRAMUSCULAR | Status: DC
  Administered 2017-11-29: 200 ug via SUBCUTANEOUS
  Filled 2017-11-29: qty 0.4

## 2017-11-29 NOTE — Progress Notes (Signed)
[facility] Rounding Note  Subjective/Interval History:  Feeling depressed and scared Tearful IS making urine, creatinine still rising some Weight down 2 kg  Objective Vital signs in last 24 hours: Vitals:   11/28/17 2039 11/29/17 0028 11/29/17 0500 11/29/17 0905  BP: 123/70 133/71 125/85 125/77  Pulse: 91 92 (!) 105 (!) 101  Resp: 16 18  16   Temp: 98.3 F (36.8 C) 98.9 F (37.2 C) 98.2 F (36.8 C) 98.4 F (36.9 C)  TempSrc: Oral Oral Oral Oral  SpO2: 99% 100% 100% 99%  Weight:   92.8 kg (204 lb 9.6 oz)   Height:       Weight change: -1.678 kg (-11.2 oz)  Intake/Output Summary (Last 24 hours) at 11/29/2017 1033 Last data filed at 11/29/2017 0542 Gross per 24 hour  Intake 822 ml  Output 2125 ml  Net -1303 ml   Physical Exam:  Blood pressure 125/77, pulse (!) 101, temperature 98.4 F (36.9 C), temperature source Oral, resp. rate 16, height 4\' 9"  (1.448 m), weight 92.8 kg (204 lb 9.6 oz), last menstrual period 10/29/2017, SpO2 99 %. Soft spoken AAF - fiance in room with her NAD VS noted JVD to jaw angle Basilar crackles, no wheezes S1S2 No S3 or murmur.  Heart sounds somewhat distant Abdomen obese, no focal tenderness Poorly fitting TED hose 2+ pitting edema to knees, 1+ thighs no real change yet Neuro: alert, Ox3, no focal deficit  Recent Labs  Lab 11/26/17 0200 11/27/17 0502 11/28/17 0547 11/29/17 0437  NA 139 141 139 139  K 3.8 3.7 3.9 3.9  CL 109 110 108 109  CO2 20* 20* 21* 20*  GLUCOSE 203* 125* 146* 108*  BUN 52* 51* 51* 54*  CREATININE 4.94* 5.01* 5.03* 5.24*  CALCIUM 6.7* 7.0* 6.8* 7.0*  PHOS  --   --   --  5.7*   Liver Function Tests: Recent Labs  Lab 11/29/17 0437  ALBUMIN 1.4*    Recent Labs  Lab 11/26/17 0200 11/28/17 0547 11/29/17 0437  WBC 7.3 6.1 6.2  HGB 8.4* 8.3* 7.5*  HCT 27.9* 27.1* 24.6*  MCV 63.8* 62.9* 62.6*  PLT 368 361 356    Recent Labs  Lab 11/26/17 0200  TROPONINI <0.03    Recent Labs  Lab 11/28/17 0732  11/28/17 1126 11/28/17 1615 11/28/17 2143 11/29/17 0736  GLUCAP 145* 166* 113* 147* 110*    Iron Studies:  Recent Labs  Lab 11/26/17 0622  IRON 19*  TIBC 245*  FERRITIN 9*   Studies/Results: Dg Chest 2 View  Result Date: 11/26/2017 CLINICAL DATA:  Shortness of breath EXAM: CHEST  2 VIEW COMPARISON:  10/26/2016, 10/12/2016 FINDINGS: Cardiomegaly with mild central vascular congestion. Streaky atelectasis at the left lung base. Small focal opacity in the left mid lung, new compared to prior. No pneumothorax. IMPRESSION: 1. Oval focal opacity in the left mid lung, new compared to prior may represent focus of infection 2. Similar appearance of cardiomegaly with mild vascular congestion. 3. Partial but incomplete clearing of left basilar airspace disease. Electronically Signed   By: Jasmine Pang M.D.   On: 11/26/2017 02:45   US Renal  Result Date: 11/26/2017 CLINICAL DATA:  32 year old diabetic hypertensive female with renal failure today. Initial encounter. EXAM: RENAL / URINARY TRACT ULTRASOUND COMPLETE COMPARISON:  10/28/2016 renal sonogram. FINDINGS: Right Kidney: Length: 10.2 cm. Echogenicity within normal limits. No mass or hydronephrosis visualized. Left Kidney: Length: 10.4 cm. Echogenicity within normal limits. No mass or hydronephrosis visualized. Bladder: Appears normal for  degree of bladder distention. Small bilateral pleural effusions. Uterine fibroids incompletely evaluated. IMPRESSION: Negative renal sonogram. Small bilateral pleural effusions. Uterine fibroids incompletely assessed. Electronically Signed   By: Lacy Duverney M.D.   On: 11/26/2017 07:02   Medications: . sodium chloride    . [START ON 12/01/2017] ferumoxytol     . carvedilol  6.25 mg Oral BID WC  . furosemide  80 mg Intravenous BID  . insulin aspart  0-5 Units Subcutaneous QHS  . insulin aspart  0-9 Units Subcutaneous TID WC  . isosorbide-hydrALAZINE  1 tablet Oral TID  . sodium chloride flush  3 mL  Intravenous Q12H   Background: 32 y.o. year-old woman PMH DM, HTN, CKD 3 (no labs since 10/2016 creatinine around 1.9 at that time GFR 30's) and chronic systolic HF (EF 30-35% 10/2016)  Was taking extra lasix 2/2 edema/SOB, then ran out totally and had none for some time. (Also ran out of insulin) Presented to ED 11/26/17 c/o  PND, orthopnea, exertional dyspnea. Elevated BP, creatinine 4.9, anemia with very low tsat, CXR vascular congestion. Last weight by cards 2018  191lb, adm 208. Asked to see because of creatinine of 5. No UA this adm (+ dip protein 10/2016; never quantitated), renal US 10.2, 10.4 no hydro.   Assessment/Recommendations  1. Renal failure - presume  AKI on CKD (cardiorenal vs uncontrolled DM/HTN) but cannot exclude natural progression of proteinuric CKD 2/2 DM.   1. SPEP, UPEP, UPC, UA and PTH all ordered, none done yet.  2. Trend labs, exam. If not seeing any improvement w/time/vol and DM/HTN mgmt, may have to presume progression underlying disease. (Mother and grandmother were both on dialysis). Will need ongoing renal followup as outpt.  3. Creatinine up a little more today, not sig change GFR.  4. Have discussed HD in limited way - not "ready" for vein mapping/access evaluation yet 2. Acute on chronic systolic heart failure - EF 35-40%. Vol overloaded.  1. Agree with diuretic ^ lasix 80 IV BID as recommended by cards.  2. Good UOP.   3. Avoiding ACE/ARB/spironolactone 3. 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 PTH ordered. 5. Anemia with Fe def - agree with Feraheme as ordered.  1. Will proceed with Aranesp dosing 200/week to start today.  6. IDDM - out of insulin for some unclear time period. A1C 7.6  Per primary team. Avoid further use metformin at current GFR   Camille Bal, MD [facility] [phone] pager 11/29/2017, 10:33 AM

## 2017-11-29 NOTE — Progress Notes (Signed)
PROGRESS NOTE    Lisa Hodges  UOH:[id number] DOB: [birth year] DOA: 11/26/2017 PCP: Leilani Able, MD   Brief Narrative:  HPI on 11/26/2017 by Dr. Fransisco Beau Chai is a 32 y.o. female with medical history significant for insulin-dependent diabetes mellitus, hypertension, chronic kidney disease stage III, and chronic systolic CHF, now presenting to the emergency department for evaluation of shortness of breath and swelling.  Patient reports that she ran out of her insulin approximately a month ago and ran out of her Lasix a couple weeks ago, and has noted the insidious development of shortness of breath and leg swelling over that interval.  She reports worsening orthopnea.  Denies chest pain, palpitations, fevers, chills, or productive cough.  Has not noticed any change in her urination  Interim history Admitted for CHF exacerbation. Seen by cardiology and nephrology for renal failure.  Assessment & Plan   Acute on chronic systolic heart failure -Patient presented with progressive dyspnea, orthopnea, edema and weight gain -Needs her story has been changing regarding her Lasix versus taking more than prescribed due to her swelling -Chest x-ray showed some vascular congestion with possible infection however patient has no leukocytosis or fever -Echocardiogram EF of 35-40%, grade 2 diastolic dysfunction, trivial aortic regurgitation, mild mitral regurgitation.  Small to moderate free-flowing pericardial effusion.  Mild pulmonary hypertension. -Patient did have a stress test last year showing low risk -Cardiology consulted and appreciated -Currently on IV Lasix 80 mg twice daily -Continue to monitor intake and output, daily weights  Acute kidney injury on chronic kidney disease, stage III -Patient presented with a creatinine of 4.94 on admission which was up from 1 year ago, 1.89. -Renal ultrasound unremarkable -Nephrology consulted and appreciated -Creatinine today 5.24 -SPEP, UPEP, APC,  UA and PTH ordered  Essential hypertension/hypertensive urgency -Improved, continue Coreg and bidil, Lasix  Diabetes mellitus, type II -Patient was only taking metformin as she ran out of her insulin -Hemoglobin A1c 7.6 -Continue insulin sliding scale, CBG monitoring  Microcytic anemia/anemia of chronic disease -Hemoglobin 7.5 -Currently on Feraheme and Aranesp -Continue to monitor CBC  DVT Prophylaxis  SCDs  Code Status: Full  Family Communication: None at bedside  Disposition Plan: Admitted.  Pending improvement in CHF and renal failure.  Suspect home at discharge  Consultants Cardiology Nephrology  Procedures  Renal ultrasound Echocardiogram  Antibiotics   Anti-infectives (From admission, onward)   None      Subjective:   [name] seen and examined today.  Feels breathing has improved as well as lower extremity swelling. Denies chest pain, abdominal pain, N/V/D/C.  Objective:   Vitals:   11/29/17 0028 11/29/17 0500 11/29/17 0905 11/29/17 1107  BP: 133/71 125/85 125/77 122/83  Pulse: 92 (!) 105 (!) 101 92  Resp: 18  16   Temp: 98.9 F (37.2 C) 98.2 F (36.8 C) 98.4 F (36.9 C)   TempSrc: Oral Oral Oral   SpO2: 100% 100% 99%   Weight:  92.8 kg (204 lb 9.6 oz)    Height:        Intake/Output Summary (Last 24 hours) at 11/29/2017 1454 Last data filed at 11/29/2017 1310 Gross per 24 hour  Intake 942 ml  Output 1625 ml  Net -683 ml   Filed Weights   11/27/17 0435 11/28/17 0343 11/29/17 0500  Weight: 93.6 kg (206 lb 4.8 oz) 94.5 kg (208 lb 4.8 oz) 92.8 kg (204 lb 9.6 oz)   Exam  General: Well developed, well nourished, NAD, appears stated age  HEENT:  NCAT, PERRLA, EOMI, Anicteic Sclera, mucous membranes moist.   Neck: Supple, +JVP  Cardiovascular: S1 S2 auscultated, no rubs, murmurs or gallops. Regular rate and rhythm.  Respiratory: Clear to auscultation bilaterally with equal chest rise  Abdomen: Soft, obese, nontender, nondistended, +  bowel sounds  Extremities: warm dry without cyanosis clubbing. +Lower ext edema  Neuro: AAOx3, nonfocal  Psych: Appropriate mood and affect   Data Reviewed: I have personally reviewed following labs and imaging studies  CBC: Recent Labs  Lab 11/26/17 0200 11/28/17 0547 11/29/17 0437  WBC 7.3 6.1 6.2  HGB 8.4* 8.3* 7.5*  HCT 27.9* 27.1* 24.6*  MCV 63.8* 62.9* 62.6*  PLT 368 361 356   Basic Metabolic Panel: Recent Labs  Lab 11/26/17 0200 11/27/17 0502 11/28/17 0547 11/29/17 0437  NA 139 141 139 139  K 3.8 3.7 3.9 3.9  CL 109 110 108 109  CO2 20* 20* 21* 20*  GLUCOSE 203* 125* 146* 108*  BUN 52* 51* 51* 54*  CREATININE 4.94* 5.01* 5.03* 5.24*  CALCIUM 6.7* 7.0* 6.8* 7.0*  PHOS  --   --   --  5.7*   GFR: Estimated Creatinine Clearance: 14.5 mL/min (A) (by C-G formula based on SCr of 5.24 mg/dL (H)). Liver Function Tests: Recent Labs  Lab 11/29/17 0437  ALBUMIN 1.4*   No results for input(s): LIPASE, AMYLASE in the last 168 hours. No results for input(s): AMMONIA in the last 168 hours. Coagulation Profile: No results for input(s): INR, PROTIME in the last 168 hours. Cardiac Enzymes: Recent Labs  Lab 11/26/17 0200  TROPONINI <0.03   BNP (last 3 results) No results for input(s): PROBNP in the last 8760 hours. HbA1C: No results for input(s): HGBA1C in the last 72 hours. CBG: Recent Labs  Lab 11/28/17 1126 11/28/17 1615 11/28/17 2143 11/29/17 0736 11/29/17 1106  GLUCAP 166* 113* 147* 110* 150*   Lipid Profile: No results for input(s): CHOL, HDL, LDLCALC, TRIG, CHOLHDL, LDLDIRECT in the last 72 hours. Thyroid Function Tests: No results for input(s): TSH, T4TOTAL, FREET4, T3FREE, THYROIDAB in the last 72 hours. Anemia Panel: No results for input(s): VITAMINB12, FOLATE, FERRITIN, TIBC, IRON, RETICCTPCT in the last 72 hours. Urine analysis:    Component Value Date/Time   COLORURINE STRAW (A) 10/28/2016 1806   APPEARANCEUR CLEAR 10/28/2016 1806    LABSPEC 1.006 10/28/2016 1806   PHURINE 6.0 10/28/2016 1806   GLUCOSEU 50 (A) 10/28/2016 1806   HGBUR SMALL (A) 10/28/2016 1806   BILIRUBINUR NEGATIVE 10/28/2016 1806   KETONESUR NEGATIVE 10/28/2016 1806   PROTEINUR 100 (A) 10/28/2016 1806   UROBILINOGEN 0.2 09/01/2014 2124   NITRITE NEGATIVE 10/28/2016 1806   LEUKOCYTESUR NEGATIVE 10/28/2016 1806   Sepsis Labs: @LABRCNTIP (procalcitonin:4,lacticidven:4)  )No results found for this or any previous visit (from the past 240 hour(s)).    Radiology Studies: No results found.   Scheduled Meds: . carvedilol  6.25 mg Oral BID WC  . darbepoetin (ARANESP) injection - NON-DIALYSIS  200 mcg Subcutaneous Q Thu-1800  . furosemide  80 mg Intravenous BID  . insulin aspart  0-5 Units Subcutaneous QHS  . insulin aspart  0-9 Units Subcutaneous TID WC  . isosorbide-hydrALAZINE  1 tablet Oral TID  . sevelamer carbonate  800 mg Oral TID WC  . sodium chloride flush  3 mL Intravenous Q12H   Continuous Infusions: . sodium chloride    . [START ON 12/01/2017] ferumoxytol       LOS: 3 days   Time Spent in minutes   30  minutes  Marshaun Lortie D.O. on 11/29/2017 at 2:54 PM  Between 7am to 7pm - Pager - [phone]  After 7pm go to [email or web address] - password [email or web address]  And look for the night coverage person covering for me after hours  [facility] Office  [phone]

## 2017-11-29 NOTE — Plan of Care (Signed)
  Education: Knowledge of General Education information will improve 11/29/2017 0719 - Completed/Met by Evert Kohl, RN   Health Behavior/Discharge Planning: Ability to manage health-related needs will improve 11/29/2017 0719 - Completed/Met by Evert Kohl, RN   Activity: Risk for activity intolerance will decrease 11/29/2017 0719 - Completed/Met by Evert Kohl, RN   Nutrition: Adequate nutrition will be maintained 11/29/2017 0719 - Completed/Met by Evert Kohl, RN   Coping: Level of anxiety will decrease 11/29/2017 0719 - Completed/Met by Evert Kohl, RN

## 2017-11-29 NOTE — Progress Notes (Signed)
Patient continues to refuse bed alarm. Will continue to monitor patient. 

## 2017-11-29 NOTE — Plan of Care (Signed)
  Education: Knowledge of General Education information will improve 11/29/2017 0719 - Completed/Met by Evert Kohl, RN   Health Behavior/Discharge Planning: Ability to manage health-related needs will improve 11/29/2017 0719 - Completed/Met by Evert Kohl, RN   Activity: Risk for activity intolerance will decrease 11/29/2017 0719 - Completed/Met by Evert Kohl, RN   Nutrition: Adequate nutrition will be maintained 11/29/2017 0719 - Completed/Met by Evert Kohl, RN   Coping: Level of anxiety will decrease 11/29/2017 0719 - Completed/Met by Evert Kohl, RN   Pain Managment: General experience of comfort will improve 11/29/2017 0719 - Completed/Met by Evert Kohl, RN

## 2017-11-29 NOTE — Progress Notes (Signed)
Patient ID: Lisa Hodges, female   DOB: 04-05-1986, 32 y.o.   MRN: [id number]     Advanced Heart Failure Rounding Note  Cardiologist: Shirlee Latch  Subjective:    Weight down with diuresis.  Breathing better. Creatinine up slightly to 5.24.    Objective:   Weight Range: 204 lb 9.6 oz (92.8 kg) Body mass index is 44.28 kg/m.   Vital Signs:   Temp:  [98.2 F (36.8 C)-98.9 F (37.2 C)] 98.4 F (36.9 C) (03/07 0905) Pulse Rate:  [91-105] 101 (03/07 0905) Resp:  [16-18] 16 (03/07 0905) BP: (122-143)/(70-85) 125/77 (03/07 0905) SpO2:  [96 %-100 %] 99 % (03/07 0905) Weight:  [204 lb 9.6 oz (92.8 kg)] 204 lb 9.6 oz (92.8 kg) (03/07 0500) Last BM Date: 11/28/17  Weight change: Filed Weights   11/27/17 0435 11/28/17 0343 11/29/17 0500  Weight: 206 lb 4.8 oz (93.6 kg) 208 lb 4.8 oz (94.5 kg) 204 lb 9.6 oz (92.8 kg)    Intake/Output:   Intake/Output Summary (Last 24 hours) at 11/29/2017 0936 Last data filed at 11/29/2017 0542 Gross per 24 hour  Intake 822 ml  Output 2125 ml  Net -1303 ml      Physical Exam    General:  Obese, NAD.  HEENT: Normal Neck: Supple. JVP 12 cm. Carotids 2+ bilat; no bruits. No lymphadenopathy or thyromegaly appreciated. Cor: PMI nondisplaced. Mildly tachy, regular rate & rhythm. No rubs, gallops or murmurs. Lungs: Clear Abdomen: Soft, nontender, nondistended. No hepatosplenomegaly. No bruits or masses. Good bowel sounds. Extremities: No cyanosis, clubbing, rash, 1+ edema to knees bilaterally.  Neuro: Alert & orientedx3, cranial nerves grossly intact. moves all 4 extremities w/o difficulty. Affect pleasant   Telemetry   NSR 90s-100s (personally reviewed)  Labs    CBC Recent Labs    11/28/17 0547 11/29/17 0437  WBC 6.1 6.2  HGB 8.3* 7.5*  HCT 27.1* 24.6*  MCV 62.9* 62.6*  PLT 361 356   Basic Metabolic Panel Recent Labs    40/98/11 0547 11/29/17 0437  NA 139 139  K 3.9 3.9  CL 108 109  CO2 21* 20*  GLUCOSE 146* 108*  BUN 51* 54*    CREATININE 5.03* 5.24*  CALCIUM 6.8* 7.0*  PHOS  --  5.7*   Liver Function Tests Recent Labs    11/29/17 0437  ALBUMIN 1.4*   No results for input(s): LIPASE, AMYLASE in the last 72 hours. Cardiac Enzymes No results for input(s): CKTOTAL, CKMB, CKMBINDEX, TROPONINI in the last 72 hours.  BNP: BNP (last 3 results) Recent Labs    11/26/17 0241  BNP 841.6*    ProBNP (last 3 results) No results for input(s): PROBNP in the last 8760 hours.   D-Dimer No results for input(s): DDIMER in the last 72 hours. Hemoglobin A1C No results for input(s): HGBA1C in the last 72 hours. Fasting Lipid Panel No results for input(s): CHOL, HDL, LDLCALC, TRIG, CHOLHDL, LDLDIRECT in the last 72 hours. Thyroid Function Tests No results for input(s): TSH, T4TOTAL, T3FREE, THYROIDAB in the last 72 hours.  Invalid input(s): FREET3  Other results:   Imaging     No results found.   Medications:     Scheduled Medications: . carvedilol  6.25 mg Oral BID WC  . furosemide  80 mg Intravenous BID  . insulin aspart  0-5 Units Subcutaneous QHS  . insulin aspart  0-9 Units Subcutaneous TID WC  . isosorbide-hydrALAZINE  1 tablet Oral TID  . sodium chloride flush  3 mL Intravenous  Q12H     Infusions: . sodium chloride    . [START ON 12/01/2017] ferumoxytol       PRN Medications:  sodium chloride, acetaminophen, ALPRAZolam, alum & mag hydroxide-simeth, ondansetron (ZOFRAN) IV, sodium chloride flush, zolpidem    Assessment/Plan   1. Acute on chronic systolic CHF: Cardiomyopathy of uncertain etiology.  Has RFs for CAD with type II diabetes with onset at age 26 and HTN. HIV negative. However, never had cath due to elevated creatinine.  Also could be related to prior viral myocarditis or even long-standing diabetes or HTN.  Admission complicated by ?AKI with creatinine up to 5 on presentation.  She diuresed reasonably with IV Lasix yesterday, weight down.  Still volume overloaded on exam.   Creatinine up slightly.  - Lasix 80 mg IV bid today.  - Coreg 6.25 mg bid and Bidil 1 tab tid => SBP in 120s, will not increase today to make sure that we do not excessively drop her BP.  - With low voltage ECG, sent SPEP/UPEP.  - With her cardiac meds, she will need to be on some form of birth control.  2. 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  Cause of AKI uncertain, she had been taking escalating doses of Lasix at home, ?development of ATN.  She remains volume overloaded. Renal US unremarkable.  Suspect underlying diabetic nephropathy.   - Follow creatinine closely with diuresis => continue IV Lasix today with only slight rise in creatinine and ongoing volume overload.  - Nephrology following, will need to see as outpatient as well.  3. HTN: Coreg and Bidil, controlled. 4. DM: Started as type II on po meds at age 35. Now supposed to be on insulin but was out for several months and went back to metformin.  Surprisingly, hgbA1c was not particularly high.  5. Anemia: Fe deficient, may be due to heavy menses.   - she got feraheme.   Length of Stay: 3  Marca Ancona, MD  11/29/2017, 9:36 AM  Advanced Heart Failure Team Pager [phone] (M-F; 7a - 4p)  Please contact [facility] for night-coverage after hours (4p -7a ) and weekends on [email or web address]

## 2017-11-30 LAB — GLUCOSE, CAPILLARY
GLUCOSE-CAPILLARY: 180 mg/dL — AB (ref 65–99)
GLUCOSE-CAPILLARY: 117 mg/dL — AB (ref 65–99)
GLUCOSE-CAPILLARY: 127 mg/dL — AB (ref 65–99)
Glucose-Capillary: 156 mg/dL — ABNORMAL HIGH (ref 65–99)
Glucose-Capillary: 175 mg/dL — ABNORMAL HIGH (ref 65–99)

## 2017-11-30 LAB — UPEP/UIFE/LIGHT CHAINS/TP, 24-HR UR
% BETA, URINE: 16.1 %
ALBUMIN, U: 50.5 %
ALPHA 1 URINE: 8.1 %
Alpha 2, Urine: 9 %
Free Kappa Lt Chains,Ur: 344 mg/L — ABNORMAL HIGH (ref 1.35–24.19)
Free Kappa/Lambda Ratio: 6.91 (ref 2.04–10.37)
Free Lambda Lt Chains,Ur: 49.8 mg/L — ABNORMAL HIGH (ref 0.24–6.66)
GAMMA GLOBULIN URINE: 16.4 %
TOTAL PROTEIN, URINE-UPE24: 778 mg/dL
TOTAL PROTEIN, URINE-UR/DAY: 11670 mg/(24.h) — AB (ref 30–150)
Total Volume: 1500

## 2017-11-30 LAB — RENAL FUNCTION PANEL
ANION GAP: 10 (ref 5–15)
Albumin: 1.6 g/dL — ABNORMAL LOW (ref 3.5–5.0)
BUN: 54 mg/dL — ABNORMAL HIGH (ref 6–20)
CHLORIDE: 109 mmol/L (ref 101–111)
CO2: 21 mmol/L — AB (ref 22–32)
Calcium: 7.3 mg/dL — ABNORMAL LOW (ref 8.9–10.3)
Creatinine, Ser: 5.27 mg/dL — ABNORMAL HIGH (ref 0.44–1.00)
GFR calc non Af Amer: 10 mL/min — ABNORMAL LOW (ref >60)
GFR, EST AFRICAN AMERICAN: 11 mL/min — AB (ref >60)
GLUCOSE: 109 mg/dL — AB (ref 65–99)
POTASSIUM: 3.6 mmol/L (ref 3.5–5.1)
Phosphorus: 5.3 mg/dL — ABNORMAL HIGH (ref 2.5–4.6)
SODIUM: 140 mmol/L (ref 135–145)

## 2017-11-30 LAB — CBC WITH DIFFERENTIAL/PLATELET
BASOS PCT: 0 %
Basophils Absolute: 0 10*3/uL (ref 0.0–0.1)
EOS PCT: 2 %
Eosinophils Absolute: 0.1 10*3/uL (ref 0.0–0.7)
HEMATOCRIT: 25.2 % — AB (ref 36.0–46.0)
Hemoglobin: 7.7 g/dL — ABNORMAL LOW (ref 12.0–15.0)
LYMPHS PCT: 34 %
Lymphs Abs: 1.9 10*3/uL (ref 0.7–4.0)
MCH: 19.2 pg — AB (ref 26.0–34.0)
MCHC: 30.6 g/dL (ref 30.0–36.0)
MCV: 62.8 fL — ABNORMAL LOW (ref 78.0–100.0)
MONOS PCT: 9 %
Monocytes Absolute: 0.5 10*3/uL (ref 0.1–1.0)
NEUTROS ABS: 3 10*3/uL (ref 1.7–7.7)
NEUTROS PCT: 55 %
Platelets: 369 10*3/uL (ref 150–400)
RBC: 4.01 MIL/uL (ref 3.87–5.11)
RDW: 16.1 % — ABNORMAL HIGH (ref 11.5–15.5)
WBC: 5.5 10*3/uL (ref 4.0–10.5)

## 2017-11-30 LAB — PROTEIN ELECTROPHORESIS, SERUM
A/G RATIO SPE: 0.6 — AB (ref 0.7–1.7)
ALPHA-2-GLOBULIN: 1.1 g/dL — AB (ref 0.4–1.0)
Albumin ELP: 1.5 g/dL — ABNORMAL LOW (ref 2.9–4.4)
Alpha-1-Globulin: 0.2 g/dL (ref 0.0–0.4)
BETA GLOBULIN: 0.6 g/dL — AB (ref 0.7–1.3)
GAMMA GLOBULIN: 0.7 g/dL (ref 0.4–1.8)
Globulin, Total: 2.6 g/dL (ref 2.2–3.9)
Total Protein ELP: 4.1 g/dL — ABNORMAL LOW (ref 6.0–8.5)

## 2017-11-30 NOTE — Progress Notes (Signed)
[facility] Rounding Note  Subjective/Interval History:  Feeling depressed and scared Creat leveling off low 5's  Objective Vital signs in last 24 hours: Vitals:   11/29/17 2037 11/30/17 0621 11/30/17 0831 11/30/17 1343  BP: 121/73 (!) 143/70 133/82 133/78  Pulse: 94 93 98 96  Resp: 20 18 16 18   Temp: 98.1 F (36.7 C) 99.1 F (37.3 C) 98.4 F (36.9 C) 98.4 F (36.9 C)  TempSrc: Oral Oral Oral Oral  SpO2: 100% 100% 100% 100%  Weight:  91.2 kg (201 lb 1.6 oz)    Height:       Weight change: -1.588 kg (-8 oz)  Intake/Output Summary (Last 24 hours) at 11/30/2017 1511 Last data filed at 11/30/2017 0855 Gross per 24 hour  Intake 702 ml  Output 1400 ml  Net -698 ml   Physical Exam:  Blood pressure 133/78, pulse 96, temperature 98.4 F (36.9 C), temperature source Oral, resp. rate 18, height 4\' 9"  (1.448 m), weight 91.2 kg (201 lb 1.6 oz), last menstrual period 10/29/2017, SpO2 100 %. Soft spoken AAF NAD VS noted JVD to jaw angle Chest is clear bilat S1S2 No S3 or murmur.  Heart sounds somewhat distant Abdomen obese, no focal tenderness Poorly fitting TED hose 1+ pitting edema to knees Neuro: alert, Ox3, no focal deficit  Recent Labs  Lab 11/26/17 0200 11/27/17 0502 11/28/17 0547 11/29/17 0437 11/30/17 0631  NA 139 141 139 139 140  K 3.8 3.7 3.9 3.9 3.6  CL 109 110 108 109 109  CO2 20* 20* 21* 20* 21*  GLUCOSE 203* 125* 146* 108* 109*  BUN 52* 51* 51* 54* 54*  CREATININE 4.94* 5.01* 5.03* 5.24* 5.27*  CALCIUM 6.7* 7.0* 6.8* 7.0* 7.3*  PHOS  --   --   --  5.7* 5.3*   Liver Function Tests: Recent Labs  Lab 11/29/17 0437 11/30/17 0631  ALBUMIN 1.4* 1.6*    Recent Labs  Lab 11/26/17 0200 11/28/17 0547 11/29/17 0437 11/30/17 0631  WBC 7.3 6.1 6.2 5.5  NEUTROABS  --   --   --  3.0  HGB 8.4* 8.3* 7.5* 7.7*  HCT 27.9* 27.1* 24.6* 25.2*  MCV 63.8* 62.9* 62.6* 62.8*  PLT 368 361 356 369    Recent Labs  Lab 11/26/17 0200  TROPONINI <0.03    Recent Labs   Lab 11/29/17 1106 11/29/17 1630 11/29/17 2058 11/30/17 0754 11/30/17 1143  GLUCAP 150* 135* 180* 117* 156*    Iron Studies:  Recent Labs  Lab 11/26/17 0622  IRON 19*  TIBC 245*  FERRITIN 9*   Studies/Results: Dg Chest 2 View  Result Date: 11/26/2017 CLINICAL DATA:  Shortness of breath EXAM: CHEST  2 VIEW COMPARISON:  10/26/2016, 10/12/2016 FINDINGS: Cardiomegaly with mild central vascular congestion. Streaky atelectasis at the left lung base. Small focal opacity in the left mid lung, new compared to prior. No pneumothorax. IMPRESSION: 1. Oval focal opacity in the left mid lung, new compared to prior may represent focus of infection 2. Similar appearance of cardiomegaly with mild vascular congestion. 3. Partial but incomplete clearing of left basilar airspace disease. Electronically Signed   By: Jasmine Pang M.D.   On: 11/26/2017 02:45   US Renal  Result Date: 11/26/2017 CLINICAL DATA:  32 year old diabetic hypertensive female with renal failure today. Initial encounter. EXAM: RENAL / URINARY TRACT ULTRASOUND COMPLETE COMPARISON:  10/28/2016 renal sonogram. FINDINGS: Right Kidney: Length: 10.2 cm. Echogenicity within normal limits. No mass or hydronephrosis visualized. Left Kidney: Length: 10.4 cm. Echogenicity  within normal limits. No mass or hydronephrosis visualized. Bladder: Appears normal for degree of bladder distention. Small bilateral pleural effusions. Uterine fibroids incompletely evaluated. IMPRESSION: Negative renal sonogram. Small bilateral pleural effusions. Uterine fibroids incompletely assessed. Electronically Signed   By: Lacy Duverney M.D.   On: 11/26/2017 07:02   Medications: . sodium chloride    . [START ON 12/01/2017] ferumoxytol     . carvedilol  6.25 mg Oral BID WC  . darbepoetin (ARANESP) injection - NON-DIALYSIS  200 mcg Subcutaneous Q Thu-1800  . furosemide  80 mg Intravenous BID  . insulin aspart  0-5 Units Subcutaneous QHS  . insulin aspart  0-9 Units  Subcutaneous TID WC  . isosorbide-hydrALAZINE  1 tablet Oral TID  . sevelamer carbonate  800 mg Oral TID WC  . sodium chloride flush  3 mL Intravenous Q12H   Background: 32 y.o. year-old woman PMH DM, HTN, CKD 3 (no labs since 10/2016 creatinine around 1.9 at that time GFR 30's) and chronic systolic HF (EF 30-35% 10/2016)  Was taking extra lasix 2/2 edema/SOB, then ran out totally and had none for some time. (Also ran out of insulin) Presented to ED 11/26/17 c/o  PND, orthopnea, exertional dyspnea. Elevated BP, creatinine 4.9, anemia with very low tsat, CXR vascular congestion. Last weight by cards 2018  191lb, adm 208. Asked to see because of creatinine of 5. No UA this adm (+ dip protein 10/2016; never quantitated), renal US 10.2, 10.4 no hydro.     Assessment/Recommendations 1. Renal failure - presume  AKI on CKD (cardiorenal vs uncontrolled DM/HTN) but cannot exclude natural progression of proteinuric CKD 2/2 DM.  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 Creat low 5's, leveling off.  Will need close renal f/u when dc'd.  Will follow. Check creat am. Per cardiology to change to po diuretics likely tomorrow.  2. Chronic systolic heart failure - EF 35-40%. Diuresing per cards. Avoid acei/ ARB/ aldactone 3. 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 PTH ordered. 5. Anemia with Fe def - agree with Feraheme as ordered. Aranesp dosing 200/week  6. Will proceed with IDDM - out of insulin for some unclear time period. A1C 7.6  Per primary team. Avoid further use metformin at current GFR    Vinson Moselle MD [facility] pgr [phone]   11/30/2017, 3:14 PM

## 2017-11-30 NOTE — Plan of Care (Signed)
  Education: Ability to verbalize understanding of medication therapies will improve 11/30/2017 0947 - Progressing by Leonette Monarch, RN   Clinical Measurements: Cardiovascular complication will be avoided 11/30/2017 0947 - Progressing by Leonette Monarch, RN

## 2017-11-30 NOTE — Plan of Care (Signed)
  Cardiac: Ability to achieve and maintain adequate cardiopulmonary perfusion will improve 11/30/2017 0424 - Completed/Met by Garnett-Mellinger, Sophronia Simas, RN

## 2017-11-30 NOTE — Progress Notes (Addendum)
Patient ID: Lisa Hodges, female   DOB: [birth year], 32 y.o.   MRN: [id number]     Advanced Heart Failure Rounding Note  Cardiologist: Shirlee Latch  Subjective:    Yesterday she continued to diurese with IV lasix. Weight down 3 pounds. Creatinine 5.24>5.3.    Denies SOB.      Objective:   Weight Range: 201 lb 1.6 oz (91.2 kg) Body mass index is 43.52 kg/m.   Vital Signs:   Temp:  [98.1 F (36.7 C)-99.1 F (37.3 C)] 99.1 F (37.3 C) (03/08 0621) Pulse Rate:  [86-101] 93 (03/08 0621) Resp:  [16-20] 18 (03/08 0621) BP: (121-156)/(70-89) 143/70 (03/08 0621) SpO2:  [99 %-100 %] 100 % (03/08 0621) Weight:  [201 lb 1.6 oz (91.2 kg)] 201 lb 1.6 oz (91.2 kg) (03/08 0621) Last BM Date: 11/28/17  Weight change: Filed Weights   11/28/17 0343 11/29/17 0500 11/30/17 0621  Weight: 208 lb 4.8 oz (94.5 kg) 204 lb 9.6 oz (92.8 kg) 201 lb 1.6 oz (91.2 kg)    Intake/Output:   Intake/Output Summary (Last 24 hours) at 11/30/2017 0829 Last data filed at 11/30/2017 0620 Gross per 24 hour  Intake 702 ml  Output 1100 ml  Net -398 ml      Physical Exam  General:  No resp difficulty. Sitting on the side of the bed.  HEENT: normal Neck: supple. JVP ~10 . Carotids 2+ bilat; no bruits. No lymphadenopathy or thryomegaly appreciated. Cor: PMI nondisplaced. Regular rate & rhythm. No rubs, gallops or murmurs. Lungs: clear Abdomen: soft, nontender, nondistended. No hepatosplenomegaly. No bruits or masses. Good bowel sounds. Extremities: no cyanosis, clubbing, rash, R and LLE 2+ edema. With ted hose noted.  Neuro: alert & orientedx3, cranial nerves grossly intact. moves all 4 extremities w/o difficulty. Affect pleasant   Telemetry   NSR 90-100s personally reviewed.   Labs    CBC Recent Labs    11/28/17 0547 11/29/17 0437  WBC 6.1 6.2  HGB 8.3* 7.5*  HCT 27.1* 24.6*  MCV 62.9* 62.6*  PLT 361 356   Basic Metabolic Panel Recent Labs    73/53/29 0437 11/30/17 0631  NA 139 140  K 3.9 3.6    CL 109 109  CO2 20* 21*  GLUCOSE 108* 109*  BUN 54* 54*  CREATININE 5.24* 5.27*  CALCIUM 7.0* 7.3*  PHOS 5.7* 5.3*   Liver Function Tests Recent Labs    11/29/17 0437 11/30/17 0631  ALBUMIN 1.4* 1.6*   No results for input(s): LIPASE, AMYLASE in the last 72 hours. Cardiac Enzymes No results for input(s): CKTOTAL, CKMB, CKMBINDEX, TROPONINI in the last 72 hours.  BNP: BNP (last 3 results) Recent Labs    11/26/17 0241  BNP 841.6*    ProBNP (last 3 results) No results for input(s): PROBNP in the last 8760 hours.   D-Dimer No results for input(s): DDIMER in the last 72 hours. Hemoglobin A1C No results for input(s): HGBA1C in the last 72 hours. Fasting Lipid Panel No results for input(s): CHOL, HDL, LDLCALC, TRIG, CHOLHDL, LDLDIRECT in the last 72 hours. Thyroid Function Tests No results for input(s): TSH, T4TOTAL, T3FREE, THYROIDAB in the last 72 hours.  Invalid input(s): FREET3  Other results:   Imaging    No results found.   Medications:     Scheduled Medications: . carvedilol  6.25 mg Oral BID WC  . darbepoetin (ARANESP) injection - NON-DIALYSIS  200 mcg Subcutaneous Q Thu-1800  . furosemide  80 mg Intravenous BID  . insulin aspart  0-5 Units Subcutaneous QHS  . insulin aspart  0-9 Units Subcutaneous TID WC  . isosorbide-hydrALAZINE  1 tablet Oral TID  . sevelamer carbonate  800 mg Oral TID WC  . sodium chloride flush  3 mL Intravenous Q12H    Infusions: . sodium chloride    . [START ON 12/01/2017] ferumoxytol      PRN Medications: sodium chloride, acetaminophen, ALPRAZolam, alum & mag hydroxide-simeth, ondansetron (ZOFRAN) IV, sodium chloride flush, zolpidem    Assessment/Plan   1. Acute on chronic systolic CHF: Cardiomyopathy of uncertain etiology.  Echo 3/19 with EF 35-40%, moderately dilated LV.  Has RFs for CAD with type II diabetes with onset at age 89 and HTN. HIV negative. However, never had cath due to elevated creatinine.  Also  could be related to prior viral myocarditis or even long-standing diabetes or HTN.  Admission complicated by ?AKI with creatinine up to 5 on presentation.   -Volume status elevated but getting better. Continue to diurese with IV lasix.   -Creatinine unchanged. 5.3 - Coreg 6.25 mg bid  -Continue 1 tab bidil three times a day.  - With low voltage ECG, sent SPEP/UPEP.  - With her cardiac meds, she will need to be on some form of birth control.  2. 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  Cause of AKI uncertain, she had been taking escalating doses of Lasix at home, ?development of ATN.  She remains volume overloaded. Renal US unremarkable.  Suspect underlying diabetic nephropathy.   - Follow creatinine closely with diuresis => Creatininee 5.2>5.3  - Nephrology following, will need to see as outpatient as well.  3. HTN: Coreg and Bidil, controlled. 4. DM: Started as type II on po meds at age 41. Now supposed to be on insulin but was out for several months and went back to metformin.  Surprisingly, hgbA1c was not particularly high.  5. Anemia: Fe deficient, may be due to heavy menses.   - she got feraheme.   Consult cardiac rehab.   Length of Stay: 4  Amy Clegg, NP  11/30/2017, 8:29 AM  Advanced Heart Failure Team Pager [phone] (M-F; 7a - 4p)  Please contact [facility] for night-coverage after hours (4p -7a ) and weekends on [email or web address]  Patient seen with NP, agree with the above note.  Weight coming down with diuresis, breathing feels back to normal. Mild volume overload still on exam.   - Would continue IV Lasix today, tomorrow transition to Lasix 60 mg po bid.  - Continue current Coreg and Bidil.   Stable anemia.   Renal function stable with diuresis.  Has heavy proteinuria on UA.  Renal following.    Marca Ancona 11/30/2017 1:07 PM

## 2017-11-30 NOTE — Progress Notes (Signed)
Heart Failure Navigator Consult Note  Presentation: per Dr. Cherylynn Ridges Bernhart is a 32 year old with a history of DMI, HTN, CKD Stage III, and chronic systolic heart failure.   Diagnosed with heart failure 10/2016 during hospital admit. She had been treated for pneumonia prior to admit. Diuresed with IV lasix and transitioned to lasix 40 mg po daily, hydralazine 10 mg tid, and imdur 15 mg daily. Discharge creatinine 1.89.   Last seen by Dr Antoine Poche in May 2018. At that time lasix was increased to 60 mg daily. Weight was 191 pounds.   She has been out of insulin and lasix for about 1 month. Prior to admit she ran out of insulin about 1 month. Increased dyspnea with exertion. Says she drinks lots of fluids at home. Denies chest pain. She does not drink alcohol or smoke. Lives with her boyfriend and disabled daughter. Her daughter is wheel chair bound.   Presented to [facility] ED with increased dyspnea and leg edema. She had been out of lasix and insulin. CXR with pulmonary edema. Pertinent admission labs include: creatinine 4.94, troponin 0.03, hgb 8.4, bnp 841.  She has been diuresing with IV lasix. Todays creatinine is 5. ECHO as noted   Past Medical History:  Diagnosis Date  . CKD (chronic kidney disease)   . Diabetes mellitus without complication (HCC)   . Hypertension   . Systolic CHF [facility])     Social History   Socioeconomic History  . Marital status: Single    Spouse name: None  . Number of children: None  . Years of education: None  . Highest education level: None  Social Needs  . Financial resource strain: None  . Food insecurity - worry: None  . Food insecurity - inability: None  . Transportation needs - medical: None  . Transportation needs - non-medical: None  Occupational History  . None  Tobacco Use  . Smoking status: Never Smoker  . Smokeless tobacco: Never Used  Substance and Sexual Activity  . Alcohol use: No  . Drug use: No  . Sexual activity: None  Other  Topics Concern  . None  Social History Narrative  . None    ECHO:Study Conclusions--11/26/2017  - Left ventricle: The cavity size was normal. Systolic function was   moderately reduced. The estimated ejection fraction was in the   range of 35% to 40%. Moderate diffuse hypokinesis with regional   variations. Features are consistent with a pseudonormal left   ventricular filling pattern, with concomitant abnormal relaxation   and increased filling pressure (grade 2 diastolic dysfunction).   Doppler parameters are consistent with high ventricular filling   pressure. - Aortic valve: There was trivial regurgitation. - Mitral valve: There was mild regurgitation. - Pulmonary arteries: PA peak pressure: 38 mm Hg (S). - Pericardium, extracardiac: A small to moderate, free-flowing   pericardial effusion was identified circumferential to the heart.   The fluid had no internal echoes.  Impressions:  - The right ventricular systolic pressure was increased consistent   with mild pulmonary hypertension.  ------------------------------------------------------------------- Study data:  Comparison was made to the study of 10/26/2016.  Study status:  Routine.  Procedure:  The patient reported no pain pre or post test. Transthoracic echocardiography. Image quality was adequate.  Study completion:  There were no complications. Transthoracic echocardiography.  M-mode, complete 2D, spectral Doppler, and color Doppler.  Birthdate:  Patient birthdate: [birth year].  Age:  Patient is 32 yr old.  Sex:  Gender: female. BMI: 43.3 kg/m^2.  Blood pressure:     142/97  Patient status: Inpatient.  Study date:  Study date: 11/26/2017. Study time: 12:58 PM.  Location:  Echo laboratory.  BNP    Component Value Date/Time   BNP 841.6 (H) 11/26/2017 0241    ProBNP No results found for: PROBNP   Education Assessment and Provision:  Detailed education and instructions provided on heart failure disease  management including the following:  Signs and symptoms of Heart Failure When to call the physician Importance of daily weights Low sodium diet Fluid restriction Medication management Anticipated future follow-up appointments  Patient education given on each of the above topics.  Patient acknowledges understanding and acceptance of all instructions.  I spoke with Ms. Yates regarding her HF and current admission.  She tells me that she has had HF education before.  She says she does have scale at home and has been weighing each day.  I reviewed the importance of daily weights and when to contact the physician related to worsening signs and symptoms.  I reviewed a low sodium diet and high sodium foods to avoid.  She asked several pertinent questions. She denies any issues getting or taking prescribed medications, however of note was out of some meds prior to admission.  She will follow in the [facility] Clinic after discharge. I have given her a map and directions to the HF Clinic.  Education Materials:  "Living Better With Heart Failure" Booklet, Daily Weight Tracker Tool    High Risk Criteria for Readmission and/or Poor Patient Outcomes:   EF <30%- 35-40% with grade 2 dias dys  2 or more admissions in 6 months- No  Difficult social situation- No-cares for disabled daughter  Demonstrates medication noncompliance- denies   Barriers of Care:  Health literacy, Knowledge, and compliance  Discharge Planning:   Plans to return to home with daughter and boyfriend.  She may benefit from HF [employer] for ongoing HF education as well as compliance reinforcement.  I will send referral to Paramedic team.

## 2017-11-30 NOTE — Progress Notes (Signed)
Patient refused bed alarm. Will continue to monitor patient. 

## 2017-11-30 NOTE — Progress Notes (Signed)
CARDIAC REHAB PHASE I   PRE:  Rate/Rhythm: 83 SR  BP:  Supine: 128/76   SaO2: 99% RA  MODE:  Ambulation: 470 ft   POST:  Rate/Rhythm: 90 SR  BP:  Sitting: 143/82    SaO2: 100% RA  1400-1450 Pt ambulated 470 ft with steady gait and stand by assist. Diet handouts given to patient to include low salt, heart healthy, and diabetic.  Pt motivated to learn about education.  Would benefit from continued education. Encouraged patient to look over material and ambulate.  Nikki Dom, RN 11/30/2017 2:50 PM

## 2017-11-30 NOTE — Progress Notes (Signed)
PROGRESS NOTE    Lisa Hodges  ONG:[id number] DOB: June 15, 1986 DOA: 11/26/2017 PCP: Leilani Able, MD   Brief Narrative:  HPI on 11/26/2017 by Dr. Fransisco Beau Erb is a 32 y.o. female with medical history significant for insulin-dependent diabetes mellitus, hypertension, chronic kidney disease stage III, and chronic systolic CHF, now presenting to the emergency department for evaluation of shortness of breath and swelling.  Patient reports that she ran out of her insulin approximately a month ago and ran out of her Lasix a couple weeks ago, and has noted the insidious development of shortness of breath and leg swelling over that interval.  She reports worsening orthopnea.  Denies chest pain, palpitations, fevers, chills, or productive cough.  Has not noticed any change in her urination  Interim history Admitted for CHF exacerbation. Seen by cardiology and nephrology for renal failure.  Assessment & Plan   Acute on chronic systolic heart failure -Patient presented with progressive dyspnea, orthopnea, edema and weight gain -Needs her story has been changing regarding her Lasix versus taking more than prescribed due to her swelling -Chest x-ray showed some vascular congestion with possible infection however patient has no leukocytosis or fever -Echocardiogram EF of 35-40%, grade 2 diastolic dysfunction, trivial aortic regurgitation, mild mitral regurgitation.  Small to moderate free-flowing pericardial effusion.  Mild pulmonary hypertension. -Patient did have a stress test last year showing low risk -Cardiology consulted and appreciated -Currently on IV Lasix 80 mg twice daily- per cardiology, transition to oral lasix 60mg  BID on 12/01/2017 -Continue to monitor intake and output, daily weights  Acute kidney injury on chronic kidney disease, stage III -Patient presented with a creatinine of 4.94 on admission which was up from 1 year ago, 1.89. -Renal ultrasound unremarkable -Nephrology  consulted and appreciated -Creatinine today 5.27 -SPEP, UPEP, APC, UA and PTH pending   Essential hypertension/hypertensive urgency -Improved, continue Coreg and bidil, Lasix  Diabetes mellitus, type II -Patient was only taking metformin as she ran out of her insulin -Hemoglobin A1c 7.6 -Continue insulin sliding scale, CBG monitoring  Microcytic anemia/anemia of chronic disease -Hemoglobin 7.7 -Currently on Feraheme and Aranesp -Continue to monitor CBC  DVT Prophylaxis  SCDs  Code Status: Full  Family Communication: None at bedside  Disposition Plan: Admitted.  Pending improvement in CHF and renal failure.  Suspect home at discharge  Consultants Cardiology Nephrology  Procedures  Renal ultrasound Echocardiogram  Antibiotics   Anti-infectives (From admission, onward)   None      Subjective:   Lisa Hodges seen and examined today.  Patient feels her breathing as well as leg swelling has gotten better.  Denies current chest pain, abdominal pain, nausea or vomiting, diarrhea or constipation.  Objective:   Vitals:   11/29/17 2037 11/30/17 0621 11/30/17 0831 11/30/17 1343  BP: 121/73 (!) 143/70 133/82 133/78  Pulse: 94 93 98 96  Resp: 20 18 16 18   Temp: 98.1 F (36.7 C) 99.1 F (37.3 C) 98.4 F (36.9 C) 98.4 F (36.9 C)  TempSrc: Oral Oral Oral Oral  SpO2: 100% 100% 100% 100%  Weight:  91.2 kg (201 lb 1.6 oz)    Height:        Intake/Output Summary (Last 24 hours) at 11/30/2017 1445 Last data filed at 11/30/2017 0855 Gross per 24 hour  Intake 702 ml  Output 1400 ml  Net -698 ml   Filed Weights   11/28/17 0343 11/29/17 0500 11/30/17 0621  Weight: 94.5 kg (208 lb 4.8 oz) 92.8 kg (204 lb 9.6  oz) 91.2 kg (201 lb 1.6 oz)   Exam  General: Well developed, well nourished, NAD, appears stated age  HEENT: NCAT, mucous membranes moist.   Neck: Supple, +JVP  Cardiovascular: S1 S2 auscultated, no rubs, murmurs or gallops. Regular rate and  rhythm.  Respiratory: Clear to auscultation bilaterally with equal chest rise  Abdomen: Soft, obese, nontender, nondistended, + bowel sounds  Extremities: warm dry without cyanosis clubbing. +LE edema, ted hose in place  Neuro: AAOx3, nonfocal  Psych: Normal affect and demeanor with intact judgement and insight  Data Reviewed: I have personally reviewed following labs and imaging studies  CBC: Recent Labs  Lab 11/26/17 0200 11/28/17 0547 11/29/17 0437 11/30/17 0631  WBC 7.3 6.1 6.2 5.5  NEUTROABS  --   --   --  3.0  HGB 8.4* 8.3* 7.5* 7.7*  HCT 27.9* 27.1* 24.6* 25.2*  MCV 63.8* 62.9* 62.6* 62.8*  PLT 368 361 356 369   Basic Metabolic Panel: Recent Labs  Lab 11/26/17 0200 11/27/17 0502 11/28/17 0547 11/29/17 0437 11/30/17 0631  NA 139 141 139 139 140  K 3.8 3.7 3.9 3.9 3.6  CL 109 110 108 109 109  CO2 20* 20* 21* 20* 21*  GLUCOSE 203* 125* 146* 108* 109*  BUN 52* 51* 51* 54* 54*  CREATININE 4.94* 5.01* 5.03* 5.24* 5.27*  CALCIUM 6.7* 7.0* 6.8* 7.0* 7.3*  PHOS  --   --   --  5.7* 5.3*   GFR: Estimated Creatinine Clearance: 14.3 mL/min (A) (by C-G formula based on SCr of 5.27 mg/dL (H)). Liver Function Tests: Recent Labs  Lab 11/29/17 0437 11/30/17 0631  ALBUMIN 1.4* 1.6*   No results for input(s): LIPASE, AMYLASE in the last 168 hours. No results for input(s): AMMONIA in the last 168 hours. Coagulation Profile: No results for input(s): INR, PROTIME in the last 168 hours. Cardiac Enzymes: Recent Labs  Lab 11/26/17 0200  TROPONINI <0.03   BNP (last 3 results) No results for input(s): PROBNP in the last 8760 hours. HbA1C: No results for input(s): HGBA1C in the last 72 hours. CBG: Recent Labs  Lab 11/29/17 1106 11/29/17 1630 11/29/17 2058 11/30/17 0754 11/30/17 1143  GLUCAP 150* 135* 180* 117* 156*   Lipid Profile: No results for input(s): CHOL, HDL, LDLCALC, TRIG, CHOLHDL, LDLDIRECT in the last 72 hours. Thyroid Function Tests: No results  for input(s): TSH, T4TOTAL, FREET4, T3FREE, THYROIDAB in the last 72 hours. Anemia Panel: No results for input(s): VITAMINB12, FOLATE, FERRITIN, TIBC, IRON, RETICCTPCT in the last 72 hours. Urine analysis:    Component Value Date/Time   COLORURINE YELLOW 11/29/2017 2250   APPEARANCEUR CLEAR 11/29/2017 2250   LABSPEC 1.011 11/29/2017 2250   PHURINE 6.0 11/29/2017 2250   GLUCOSEU 150 (A) 11/29/2017 2250   HGBUR SMALL (A) 11/29/2017 2250   BILIRUBINUR NEGATIVE 11/29/2017 2250   KETONESUR NEGATIVE 11/29/2017 2250   PROTEINUR >=300 (A) 11/29/2017 2250   UROBILINOGEN 0.2 09/01/2014 2124   NITRITE NEGATIVE 11/29/2017 2250   LEUKOCYTESUR NEGATIVE 11/29/2017 2250   Sepsis Labs: @LABRCNTIP (procalcitonin:4,lacticidven:4)  )No results found for this or any previous visit (from the past 240 hour(s)).    Radiology Studies: No results found.   Scheduled Meds: . carvedilol  6.25 mg Oral BID WC  . darbepoetin (ARANESP) injection - NON-DIALYSIS  200 mcg Subcutaneous Q Thu-1800  . furosemide  80 mg Intravenous BID  . insulin aspart  0-5 Units Subcutaneous QHS  . insulin aspart  0-9 Units Subcutaneous TID WC  . isosorbide-hydrALAZINE  1 tablet Oral TID  . sevelamer carbonate  800 mg Oral TID WC  . sodium chloride flush  3 mL Intravenous Q12H   Continuous Infusions: . sodium chloride    . [START ON 12/01/2017] ferumoxytol       LOS: 4 days   Time Spent in minutes   30 minutes  Aariya Ferrick D.O. on 11/30/2017 at 2:45 PM  Between 7am to 7pm - Pager - [phone]  After 7pm go to [email or web address] - password [email or web address]  And look for the night coverage person covering for me after hours  [facility] Office  [phone]

## 2017-12-01 LAB — GLUCOSE, CAPILLARY
GLUCOSE-CAPILLARY: 131 mg/dL — AB (ref 65–99)
GLUCOSE-CAPILLARY: 120 mg/dL — AB (ref 65–99)
Glucose-Capillary: 142 mg/dL — ABNORMAL HIGH (ref 65–99)
Glucose-Capillary: 172 mg/dL — ABNORMAL HIGH (ref 65–99)

## 2017-12-01 LAB — RENAL FUNCTION PANEL
ANION GAP: 11 (ref 5–15)
Albumin: 1.6 g/dL — ABNORMAL LOW (ref 3.5–5.0)
BUN: 54 mg/dL — ABNORMAL HIGH (ref 6–20)
CALCIUM: 7.4 mg/dL — AB (ref 8.9–10.3)
CO2: 21 mmol/L — ABNORMAL LOW (ref 22–32)
Chloride: 107 mmol/L (ref 101–111)
Creatinine, Ser: 5.01 mg/dL — ABNORMAL HIGH (ref 0.44–1.00)
GFR calc Af Amer: 12 mL/min — ABNORMAL LOW (ref >60)
GFR calc non Af Amer: 10 mL/min — ABNORMAL LOW (ref >60)
Glucose, Bld: 145 mg/dL — ABNORMAL HIGH (ref 65–99)
Phosphorus: 5.3 mg/dL — ABNORMAL HIGH (ref 2.5–4.6)
Potassium: 3.7 mmol/L (ref 3.5–5.1)
SODIUM: 139 mmol/L (ref 135–145)

## 2017-12-01 LAB — CBC WITH DIFFERENTIAL/PLATELET
Basophils Absolute: 0 10*3/uL (ref 0.0–0.1)
Basophils Relative: 0 %
EOS ABS: 0.1 10*3/uL (ref 0.0–0.7)
Eosinophils Relative: 2 %
HEMATOCRIT: 25.3 % — AB (ref 36.0–46.0)
HEMOGLOBIN: 8 g/dL — AB (ref 12.0–15.0)
LYMPHS PCT: 29 %
Lymphs Abs: 1.5 10*3/uL (ref 0.7–4.0)
MCH: 20.1 pg — ABNORMAL LOW (ref 26.0–34.0)
MCHC: 31.6 g/dL (ref 30.0–36.0)
MCV: 63.6 fL — AB (ref 78.0–100.0)
Monocytes Absolute: 0.4 10*3/uL (ref 0.1–1.0)
Monocytes Relative: 8 %
Neutro Abs: 3.3 10*3/uL (ref 1.7–7.7)
Neutrophils Relative %: 61 %
Platelets: 366 10*3/uL (ref 150–400)
RBC: 3.98 MIL/uL (ref 3.87–5.11)
RDW: 16.6 % — ABNORMAL HIGH (ref 11.5–15.5)
WBC: 5.3 10*3/uL (ref 4.0–10.5)

## 2017-12-01 LAB — PROTEIN / CREATININE RATIO, URINE
Creatinine, Urine: 74.78 mg/dL
PROTEIN CREATININE RATIO: 12.96 mg/mg{creat} — AB (ref 0.00–0.15)
Total Protein, Urine: 969 mg/dL

## 2017-12-01 MED ORDER — CARVEDILOL 12.5 MG PO TABS
12.5000 mg | ORAL_TABLET | Freq: Two times a day (BID) | ORAL | Status: DC
  Administered 2017-12-01 – 2017-12-02 (×2): 12.5 mg via ORAL
  Filled 2017-12-01 (×2): qty 1

## 2017-12-01 MED ORDER — FUROSEMIDE 20 MG PO TABS
60.0000 mg | ORAL_TABLET | Freq: Two times a day (BID) | ORAL | Status: DC
  Administered 2017-12-01 – 2017-12-02 (×2): 60 mg via ORAL
  Filled 2017-12-01 (×2): qty 1

## 2017-12-01 NOTE — Progress Notes (Signed)
PROGRESS NOTE    Lisa Hodges  IDH:[id number] DOB: 1986/09/16 DOA: 11/26/2017 PCP: Leilani Able, MD   Brief Narrative:  HPI on 11/26/2017 by Dr. Fransisco Beau Rigg is a 32 y.o. female with medical history significant for insulin-dependent diabetes mellitus, hypertension, chronic kidney disease stage III, and chronic systolic CHF, now presenting to the emergency department for evaluation of shortness of breath and swelling.  Patient reports that she ran out of her insulin approximately a month ago and ran out of her Lasix a couple weeks ago, and has noted the insidious development of shortness of breath and leg swelling over that interval.  She reports worsening orthopnea.  Denies chest pain, palpitations, fevers, chills, or productive cough.  Has not noticed any change in her urination  Interim history Admitted for CHF exacerbation. Seen by cardiology and nephrology for renal failure.  Assessment & Plan   Acute on chronic systolic heart failure -Patient presented with progressive dyspnea, orthopnea, edema and weight gain -Needs her story has been changing regarding her Lasix versus taking more than prescribed due to her swelling -Chest x-ray showed some vascular congestion with possible infection however patient has no leukocytosis or fever -Echocardiogram EF of 35-40%, grade 2 diastolic dysfunction, trivial aortic regurgitation, mild mitral regurgitation.  Small to moderate free-flowing pericardial effusion.  Mild pulmonary hypertension. -Patient did have a stress test last year showing low risk -Cardiology consulted and appreciated -Currently on IV Lasix 80 mg twice daily- per cardiology, transition to oral lasix 60mg  BID on 12/01/2017- will order today -urine output over past 24 hours 1925cc -Continue to monitor intake and output, daily weights  Acute kidney injury on chronic kidney disease, stage III -Patient presented with a creatinine of 4.94 on admission which was up from 1 year  ago, 1.89. -Renal ultrasound unremarkable -Nephrology consulted and appreciated -Creatinine today 5.01 -SPEP, UPEP, APC, UA and PTH pending  -suspect patient will need outpatient nephrology follow up  Essential hypertension/hypertensive urgency -BP stable and improved -continue Coreg and bidil, Lasix  Diabetes mellitus, type II -Patient was only taking metformin as she ran out of her insulin -Hemoglobin A1c 7.6 -Continue insulin sliding scale, CBG monitoring  Microcytic anemia/anemia of chronic disease -Hemoglobin 8 -Currently on Feraheme and Aranesp -Continue to monitor CBC  DVT Prophylaxis  SCDs  Code Status: Full  Family Communication: None at bedside  Disposition Plan: Admitted.  Suspect home when stable. Pending further recommendations from cardiology  Consultants Cardiology Nephrology  Procedures  Renal ultrasound Echocardiogram  Antibiotics   Anti-infectives (From admission, onward)   None      Subjective:   [name] seen and examined today.  She feels her breathing as well as lower extremity swelling has improved.  She denies current chest pain, abdominal pain, nausea or vomiting, diarrhea or constipation, dizziness or headache.  Objective:   Vitals:   11/30/17 0831 11/30/17 1343 11/30/17 2102 12/01/17 0800  BP: 133/82 133/78 121/70 127/68  Pulse: 98 96 91 84  Resp: 16 18 18    Temp: 98.4 F (36.9 C) 98.4 F (36.9 C) 98.3 F (36.8 C)   TempSrc: Oral Oral Oral   SpO2: 100% 100% 99%   Weight:   91.6 kg (201 lb 14.4 oz)   Height:        Intake/Output Summary (Last 24 hours) at 12/01/2017 1118 Last data filed at 12/01/2017 0551 Gross per 24 hour  Intake 360 ml  Output 1625 ml  Net -1265 ml   Filed Weights   11/29/17  0500 11/30/17 0621 11/30/17 2102  Weight: 92.8 kg (204 lb 9.6 oz) 91.2 kg (201 lb 1.6 oz) 91.6 kg (201 lb 14.4 oz)   Exam  General: Well developed, well nourished, NAD, appears stated age  HEENT: NCAT,mucous membranes  moist.   Neck: Supple  Cardiovascular: S1 S2 auscultated, RRR, no murmur  Respiratory: Clear to auscultation bilaterally with equal chest rise  Abdomen: Soft, obese, nontender, nondistended, + bowel sounds  Extremities: warm dry without cyanosis clubbing. TED hose in place. +LE edema, improving  Neuro: AAOx3, nonfocal  Psych: Pleasant, appropriate mood and affect  Data Reviewed: I have personally reviewed following labs and imaging studies  CBC: Recent Labs  Lab 11/26/17 0200 11/28/17 0547 11/29/17 0437 11/30/17 0631 12/01/17 0554  WBC 7.3 6.1 6.2 5.5 5.3  NEUTROABS  --   --   --  3.0 3.3  HGB 8.4* 8.3* 7.5* 7.7* 8.0*  HCT 27.9* 27.1* 24.6* 25.2* 25.3*  MCV 63.8* 62.9* 62.6* 62.8* 63.6*  PLT 368 361 356 369 366   Basic Metabolic Panel: Recent Labs  Lab 11/27/17 0502 11/28/17 0547 11/29/17 0437 11/30/17 0631 12/01/17 0554  NA 141 139 139 140 139  K 3.7 3.9 3.9 3.6 3.7  CL 110 108 109 109 107  CO2 20* 21* 20* 21* 21*  GLUCOSE 125* 146* 108* 109* 145*  BUN 51* 51* 54* 54* 54*  CREATININE 5.01* 5.03* 5.24* 5.27* 5.01*  CALCIUM 7.0* 6.8* 7.0* 7.3* 7.4*  PHOS  --   --  5.7* 5.3* 5.3*   GFR: Estimated Creatinine Clearance: 15.1 mL/min (A) (by C-G formula based on SCr of 5.01 mg/dL (H)). Liver Function Tests: Recent Labs  Lab 11/29/17 0437 11/30/17 0631 12/01/17 0554  ALBUMIN 1.4* 1.6* 1.6*   No results for input(s): LIPASE, AMYLASE in the last 168 hours. No results for input(s): AMMONIA in the last 168 hours. Coagulation Profile: No results for input(s): INR, PROTIME in the last 168 hours. Cardiac Enzymes: Recent Labs  Lab 11/26/17 0200  TROPONINI <0.03   BNP (last 3 results) No results for input(s): PROBNP in the last 8760 hours. HbA1C: No results for input(s): HGBA1C in the last 72 hours. CBG: Recent Labs  Lab 11/30/17 0754 11/30/17 1143 11/30/17 1642 11/30/17 2117 12/01/17 0753  GLUCAP 117* 156* 127* 175* 131*   Lipid Profile: No  results for input(s): CHOL, HDL, LDLCALC, TRIG, CHOLHDL, LDLDIRECT in the last 72 hours. Thyroid Function Tests: No results for input(s): TSH, T4TOTAL, FREET4, T3FREE, THYROIDAB in the last 72 hours. Anemia Panel: No results for input(s): VITAMINB12, FOLATE, FERRITIN, TIBC, IRON, RETICCTPCT in the last 72 hours. Urine analysis:    Component Value Date/Time   COLORURINE YELLOW 11/29/2017 2250   APPEARANCEUR CLEAR 11/29/2017 2250   LABSPEC 1.011 11/29/2017 2250   PHURINE 6.0 11/29/2017 2250   GLUCOSEU 150 (A) 11/29/2017 2250   HGBUR SMALL (A) 11/29/2017 2250   BILIRUBINUR NEGATIVE 11/29/2017 2250   KETONESUR NEGATIVE 11/29/2017 2250   PROTEINUR >=300 (A) 11/29/2017 2250   UROBILINOGEN 0.2 09/01/2014 2124   NITRITE NEGATIVE 11/29/2017 2250   LEUKOCYTESUR NEGATIVE 11/29/2017 2250   Sepsis Labs: @LABRCNTIP (procalcitonin:4,lacticidven:4)  )No results found for this or any previous visit (from the past 240 hour(s)).    Radiology Studies: No results found.   Scheduled Meds: . carvedilol  6.25 mg Oral BID WC  . darbepoetin (ARANESP) injection - NON-DIALYSIS  200 mcg Subcutaneous Q Thu-1800  . furosemide  80 mg Intravenous BID  . insulin aspart  0-5 Units  Subcutaneous QHS  . insulin aspart  0-9 Units Subcutaneous TID WC  . isosorbide-hydrALAZINE  1 tablet Oral TID  . sevelamer carbonate  800 mg Oral TID WC  . sodium chloride flush  3 mL Intravenous Q12H   Continuous Infusions: . sodium chloride    . ferumoxytol       LOS: 5 days   Time Spent in minutes   30 minutes  Tamiki Kuba D.O. on 12/01/2017 at 11:18 AM  Between 7am to 7pm - Pager - [phone]  After 7pm go to [email or web address] - password [email or web address]  And look for the night coverage person covering for me after hours  [facility] Office  [phone]

## 2017-12-01 NOTE — Discharge Instructions (Signed)
Acute Kidney Injury, Adult  Acute kidney injury is a sudden worsening of kidney function. The kidneys are organs that have several jobs. They filter the blood to remove waste products and extra fluid. They also maintain a healthy balance of minerals and hormones in the body, which helps control blood pressure and keep bones strong. With this condition, your kidneys do not do their jobs as well as they should.  This condition ranges from mild to severe. Over time it may develop into long-lasting (chronic) kidney disease. Early detection and treatment may prevent acute kidney injury from developing into a chronic condition.  What are the causes?  Common causes of this condition include:   A problem with blood flow to the kidneys. This may be caused by:  ? Low blood pressure (hypotension) or shock.  ? Blood loss.  ? Heart and blood vessel (cardiovascular) disease.  ? Severe burns.  ? Liver disease.   Direct damage to the kidneys. This may be caused by:  ? Certain medicines.  ? A kidney infection.  ? Poisoning.  ? Being around or in contact with toxic substances.  ? A surgical wound.  ? A hard, direct hit to the kidney area.   A sudden blockage of urine flow. This may be caused by:  ? Cancer.  ? Kidney stones.  ? An enlarged prostate in males.    What are the signs or symptoms?  Symptoms of this condition may not be obvious until the condition becomes severe. Symptoms of this condition can include:   Tiredness (lethargy), or difficulty staying awake.   Nausea or vomiting.   Swelling (edema) of the face, legs, ankles, or feet.   Problems with urination, such as:  ? Abdominal pain, or pain along the side of your stomach (flank).  ? Decreased urine production.  ? Decrease in the force of urine flow.   Muscle twitches and cramps, especially in the legs.   Confusion or trouble concentrating.   Loss of appetite.   Fever.    How is this diagnosed?  This condition may be diagnosed with tests, including:   Blood  tests.   Urine tests.   Imaging tests.   A test in which a sample of tissue is removed from the kidneys to be examined under a microscope (kidney biopsy).    How is this treated?  Treatment for this condition depends on the cause and how severe the condition is. In mild cases, treatment may not be needed. The kidneys may heal on their own. In more severe cases, treatment will involve:   Treating the cause of the kidney injury. This may involve changing any medicines you are taking or adjusting your dosage.   Fluids. You may need specialized IV fluids to balance your body's needs.   Having a catheter placed to drain urine and prevent blockages.   Preventing problems from occurring. This may mean avoiding certain medicines or procedures that can cause further injury to the kidneys.    In some cases treatment may also require:   A procedure to remove toxic wastes from the body (dialysis or continuous renal replacement therapy - CRRT).   Surgery. This may be done to repair a torn kidney, or to remove the blockage from the urinary system.    Follow these instructions at home:  Medicines   Take over-the-counter and prescription medicines only as told by your health care provider.   Do not take any new medicines without your health care provider's   approval. Many medicines can worsen your kidney damage.   Do not take any vitamin and mineral supplements without your health care provider's approval. Many nutritional supplements can worsen your kidney damage.  Lifestyle   If your health care provider prescribed changes to your diet, follow them. You may need to decrease the amount of protein you eat.   Achieve and maintain a healthy weight. If you need help with this, ask your health care provider.   Start or continue an exercise plan. Try to exercise at least 30 minutes a day, 5 days a week.   Do not use any tobacco products, such as cigarettes, chewing tobacco, and e-cigarettes. If you need help quitting, ask  your health care provider.  General instructions   Keep track of your blood pressure. Report changes in your blood pressure as told by your health care provider.   Stay up to date with immunizations. Ask your health care provider which immunizations you need.   Keep all follow-up visits as told by your health care provider. This is important.  Where to find more information:   American Association of Kidney Patients: www.aakp.org   National Kidney Foundation: www.kidney.org   American Kidney Fund: www.akfinc.org   Life Options Rehabilitation Program:  ? www.lifeoptions.org  ? www.kidneyschool.org  Contact a health care provider if:   Your symptoms get worse.   You develop new symptoms.  Get help right away if:   You develop symptoms of worsening kidney disease, which include:  ? Headaches.  ? Abnormally dark or light skin.  ? Easy bruising.  ? Frequent hiccups.  ? Chest pain.  ? Shortness of breath.  ? End of menstruation in women.  ? Seizures.  ? Confusion or altered mental status.  ? Abdominal or back pain.  ? Itchiness.   You have a fever.   Your body is producing less urine.   You have pain or bleeding when you urinate.  Summary   Acute kidney injury is a sudden worsening of kidney function.   Acute kidney injury can be caused by problems with blood flow to the kidneys, direct damage to the kidneys, and sudden blockage of urine flow.   Symptoms of this condition may not be obvious until it becomes severe. Symptoms may include edema, lethargy, confusion, nausea or vomiting, and problems passing urine.   This condition can usually be diagnosed with blood tests, urine tests, and imaging tests. Sometimes a kidney biopsy is done to diagnose this condition.   Treatment for this condition often involves treating the underlying cause. It is treated with fluids, medicines, dialysis, diet changes, or surgery.  This information is not intended to replace advice given to you by your health care provider.  Make sure you discuss any questions you have with your health care provider.  Document Released: 03/27/2011 Document Revised: 01/11/2017 Document Reviewed: 09/01/2016  Elsevier Interactive Patient Education  2018 [employer] Inc[employer]  Heart Failure  Heart failure means your heart has trouble pumping blood. This makes it hard for your body to work well. Heart failure is usually a long-term (chronic) condition. You must take good care of yourself and follow your doctor's treatment plan.  Follow these instructions at home:   Take your heart medicine as told by your doctor.  ? Do not stop taking medicine unless your doctor tells you to.  ? Do not skip any dose of medicine.  ? Refill your medicines before they run out.  ? Take other medicines only as told   by your doctor or pharmacist.   Stay active if told by your doctor. The elderly and people with severe heart failure should talk with a doctor about physical activity.   Eat heart-healthy foods. Choose foods that are without trans fat and are low in saturated fat, cholesterol, and salt (sodium). This includes fresh or frozen fruits and vegetables, fish, lean meats, fat-free or low-fat dairy foods, whole grains, and high-fiber foods. Lentils and dried peas and beans (legumes) are also good choices.   Limit salt if told by your doctor.   Cook in a healthy way. Roast, grill, broil, bake, poach, steam, or stir-fry foods.   Limit fluids as told by your doctor.   Weigh yourself every morning. Do this after you pee (urinate) and before you eat breakfast. Write down your weight to give to your doctor.   Take your blood pressure and write it down if your doctor tells you to.   Ask your doctor how to check your pulse. Check your pulse as told.   Lose weight if told by your doctor.   Stop smoking or chewing tobacco. Do not use gum or patches that help you quit without your doctor's approval.   Schedule and go to doctor visits as told.   Nonpregnant women should have no more  than 1 drink a day. Men should have no more than 2 drinks a day. Talk to your doctor about drinking alcohol.   Stop illegal drug use.   Stay current with shots (immunizations).   Manage your health conditions as told by your doctor.   Learn to manage your stress.   Rest when you are tired.   If it is really hot outside:  ? Avoid intense activities.  ? Use air conditioning or fans, or get in a cooler place.  ? Avoid caffeine and alcohol.  ? Wear loose-fitting, lightweight, and light-colored clothing.   If it is really cold outside:  ? Avoid intense activities.  ? Layer your clothing.  ? Wear mittens or gloves, a hat, and a scarf when going outside.  ? Avoid alcohol.   Learn about heart failure and get support as needed.   Get help to maintain or improve your quality of life and your ability to care for yourself as needed.  Contact a doctor if:   You gain weight quickly.   You are more short of breath than usual.   You cannot do your normal activities.   You tire easily.   You cough more than normal, especially with activity.   You have any or more puffiness (swelling) in areas such as your hands, feet, ankles, or belly (abdomen).   You cannot sleep because it is hard to breathe.   You feel like your heart is beating fast (palpitations).   You get dizzy or light-headed when you stand up.  Get help right away if:   You have trouble breathing.   There is a change in mental status, such as becoming less alert or not being able to focus.   You have chest pain or discomfort.   You faint.  This information is not intended to replace advice given to you by your health care provider. Make sure you discuss any questions you have with your health care provider.  Document Released: 06/20/2008 Document Revised: 02/17/2016 Document Reviewed: 10/28/2012  Elsevier Interactive Patient Education  2017 Elsevier Inc.

## 2017-12-01 NOTE — Progress Notes (Signed)
PHASE I CARDIAC REHAB  Pt declined ambulation due to leaking IV catheter. Pt states she is walking on her own. RN made aware ambulation deferred at this time.  Deveron Furlong, RN, BSN Cardiac Pulmonary Rehab 12/01/17  2:37 PM

## 2017-12-01 NOTE — Progress Notes (Signed)
MD aware of needing urine sample and loss of pt access delayed IV feraheme  MD stated she will discharge pt tomorrow  Pt and pt family aware

## 2017-12-01 NOTE — Discharge Summary (Signed)
Physician Discharge Summary  Lisa Hodges:[id number] DOB: May 16, 1986 DOA: 11/26/2017  PCP: Lin Landsman, MD  Admit date: 11/26/2017 Discharge date: 12/02/2017  Time spent: 45 minutes  Recommendations for Outpatient Follow-up:  Patient will be discharged to home.  Patient will need to follow up with primary care provider within one week of discharge, repeat CBC and BMP.  Follow up with cardiology and nephrology. Patient should continue medications as prescribed.  Patient should follow a heart healthy/carb modified diet with 1211m fluid restriction per day.  Discharge Diagnoses:  Acute on chronic systolic heart failure Acute kidney injury on chronic kidney disease, stage III Essential hypertension/hypertensive urgency Diabetes mellitus, type II Microcytic anemia/anemia of chronic disease  Discharge Condition: Stable  Diet recommendation: heart healthy/carb modified diet with 12024mfluid restriction per day  Filed Weights   11/30/17 0621 11/30/17 2102 12/02/17 0600  Weight: 91.2 kg (201 lb 1.6 oz) 91.6 kg (201 lb 14.4 oz) 90.8 kg (200 lb 1.6 oz)    History of present illness:  on 11/26/2017 by Dr. TiEinar Pheasantilesis a [age].o.femalewith medical history significant forinsulin-dependent diabetes mellitus, hypertension, chronic kidney disease stage III, and chronic systolic CHF, now presenting to the emergency department for evaluation of shortness of breath and swelling. Patient reports that she ran out of her insulin approximately a month ago and ran out of her Lasix a couple weeks ago, and has noted the insidious development of shortness of breath and leg swelling over that interval. She reports worsening orthopnea. Denies chest pain, palpitations, fevers, chills, or productive cough. Has not noticed any change in her urination  Hospital Course:  Acute on chronic systolic heart failure -Patient presented with progressive dyspnea, orthopnea, edema and weight gain -Needs  her story has been changing regarding her Lasix versus taking more than prescribed due to her swelling -Chest x-ray showed some vascular congestion with possible infection however patient has no leukocytosis or fever -Echocardiogram EF of 3433-32%grade 2 diastolic dysfunction, trivial aortic regurgitation, mild mitral regurgitation.  Small to moderate free-flowing pericardial effusion.  Mild pulmonary hypertension. -Patient did have a stress test last year showing low risk -Cardiology consulted and appreciated -Currently on IV Lasix 80 mg twice daily- per cardiology, transitioned to oral lasix 6061mID  -urine output over past 24 hours 2200cc -Continue to monitor intake and output, daily weights  Acute kidney injury on chronic kidney disease, stage III -Patient presented with a creatinine of 4.94 on admission which was up from 1 year ago, 1.89. -Renal ultrasound unremarkable -Nephrology consulted and appreciated -Creatinine today 5.2 -protein electrophoresis non selective  -UA unremarkable for infection -PTH pending and can be followed up as an outpatient  -suspect patient will need outpatient nephrology follow up -Repeat BMP in 1 week  Essential hypertension/hypertensive urgency -BP stable and improved -continue Coreg and bidil, Lasix  Diabetes mellitus, type II -Patient was only taking metformin as she ran out of her insulin -Hemoglobin A1c 7.6 -Continue 70/30 on discharge-script sent to pharmacy -discontinue metformin  Microcytic anemia/anemia of chronic disease -Hemoglobin 8.3 -Received Feraheme and Aranesp -Patient will need outpatient repeat labs to determine if there is a further need -Would repeat CBC in 1 week  Consultants Cardiology Nephrology  Procedures  Renal ultrasound Echocardiogram  Discharge Exam: Vitals:   12/02/17 0600 12/02/17 0843  BP: 134/77 133/69  Pulse: 92 94  Resp: 18   Temp: 98.3 F (36.8 C)   SpO2: 100% 100%   Patient seen and  examined on day of  discharge.  Currently feels shortness of breath as well as lower extremity edema have improved.  Denies current chest pain, abdominal pain, nausea or vomiting, diarrhea or constipation, dizziness or headache.  Exam  General: Well developed, well nourished, NAD, appears stated age  32: NCAT,mucous membranes moist.   Neck: Supple  Cardiovascular: S1 S2 auscultated, RRR, no murmur  Respiratory: Clear to auscultation bilaterally with equal chest rise  Abdomen: Soft, obese, nontender, nondistended, + bowel sounds  Extremities: warm dry without cyanosis clubbing. TED hose in place. +LE edema, improving  Neuro: AAOx3, nonfocal  Psych: Pleasant, appropriate mood and affect  Discharge Instructions Discharge Instructions    (HEART FAILURE PATIENTS) Call MD:  Anytime you have any of the following symptoms: 1) 3 pound weight gain in 24 hours or 5 pounds in 1 week 2) shortness of breath, with or without a dry hacking cough 3) swelling in the hands, feet or stomach 4) if you have to sleep on extra pillows at night in order to breathe.   Complete by:  As directed    Discharge instructions   Complete by:  As directed    Patient will be discharged to home.  Patient will need to follow up with primary care provider within one week of discharge, repeat CBC and BMP.  Follow up with cardiology and nephrology. Patient should continue medications as prescribed.  Patient should follow a heart healthy/carb modified diet with 1221m fluid restriction per day.     Allergies as of 12/02/2017   No Known Allergies     Medication List    STOP taking these medications   hydrALAZINE 10 MG tablet Commonly known as:  APRESOLINE   isosorbide mononitrate 30 MG 24 hr tablet Commonly known as:  IMDUR     TAKE these medications   ACCU-CHEK AVIVA PLUS w/Device Kit Use as directed   ACCU-CHEK SOFTCLIX LANCET DEV Kit Use as directed   ACCU-CHEK SOFTCLIX LANCETS lancets Use as  instructed   carvedilol 12.5 MG tablet Commonly known as:  COREG Take 1 tablet (12.5 mg total) by mouth 2 (two) times daily with a meal. What changed:    medication strength  how much to take   furosemide 20 MG tablet Commonly known as:  LASIX Take 3 tablets (60 mg total) by mouth 2 (two) times daily. What changed:    medication strength  how much to take  how to take this  when to take this  additional instructions   glucose blood test strip Commonly known as:  ACCU-CHEK AVIVA PLUS Use as instructed   insulin aspart protamine- aspart (70-30) 100 UNIT/ML injection Commonly known as:  NOVOLOG MIX 70/30 Inject 0.08 mLs (8 Units total) into the skin 2 (two) times daily with a meal.   INSULIN SYRINGE .5CC/31GX5/16" 31G X 5/16" 0.5 ML Misc USE AS DIRECTED.   isosorbide-hydrALAZINE 20-37.5 MG tablet Commonly known as:  BIDIL Take 1 tablet by mouth 3 (three) times daily.   sevelamer carbonate 800 MG tablet Commonly known as:  RENVELA Take 1 tablet (800 mg total) by mouth 3 (three) times daily with meals.      No Known Allergies Follow-up Information    [facility].   Why:  at 4:30 pm; please try to keep your apt or call to reschedule Contact information: [address] [facility] tele # [phone]      [facility]. Go on 12/07/2017.  Specialty:  Cardiology Why:  10:30 AM, [facility], parking code [employer] information: [address] [id number] [place], [facility]. Call in 1 week(s).   Specialty:  Nephrology Why:  Call to schedule an appointment  Contact information: [place] [address] [phone]            The results of significant diagnostics from this hospitalization (including imaging, microbiology,  ancillary and laboratory) are listed below for reference.    Significant Diagnostic Studies: Dg Chest 2 View  Result Date: 11/26/2017 CLINICAL DATA:  Shortness of breath EXAM: CHEST  2 VIEW COMPARISON:  10/26/2016, 10/12/2016 FINDINGS: Cardiomegaly with mild central vascular congestion. Streaky atelectasis at the left lung base. Small focal opacity in the left mid lung, new compared to prior. No pneumothorax. IMPRESSION: 1. Oval focal opacity in the left mid lung, new compared to prior may represent focus of infection 2. Similar appearance of cardiomegaly with mild vascular congestion. 3. Partial but incomplete clearing of left basilar airspace disease. Electronically Signed   By: Donavan Foil M.D.   On: 11/26/2017 02:45   US Renal  Result Date: 11/26/2017 CLINICAL DATA:  32 year old diabetic hypertensive female with renal failure today. Initial encounter. EXAM: RENAL / URINARY TRACT ULTRASOUND COMPLETE COMPARISON:  10/28/2016 renal sonogram. FINDINGS: Right Kidney: Length: 10.2 cm. Echogenicity within normal limits. No mass or hydronephrosis visualized. Left Kidney: Length: 10.4 cm. Echogenicity within normal limits. No mass or hydronephrosis visualized. Bladder: Appears normal for degree of bladder distention. Small bilateral pleural effusions. Uterine fibroids incompletely evaluated. IMPRESSION: Negative renal sonogram. Small bilateral pleural effusions. Uterine fibroids incompletely assessed. Electronically Signed   By: Genia Del M.D.   On: 11/26/2017 07:02    Microbiology: No results found for this or any previous visit (from the past 240 hour(s)).   Labs: Basic Metabolic Panel: Recent Labs  Lab 11/28/17 0547 11/29/17 0437 11/30/17 0631 12/01/17 0554 12/02/17 0313  NA 139 139 140 139 138  K 3.9 3.9 3.6 3.7 3.7  CL 108 109 109 107 108  CO2 21* 20* 21* 21* 22  GLUCOSE 146* 108* 109* 145* 139*  BUN 51* 54* 54* 54* 55*  CREATININE 5.03* 5.24* 5.27* 5.01* 5.22*  CALCIUM 6.8* 7.0*  7.3* 7.4* 7.4*  PHOS  --  5.7* 5.3* 5.3* 4.9*   Liver Function Tests: Recent Labs  Lab 11/29/17 0437 11/30/17 0631 12/01/17 0554 12/02/17 0313  ALBUMIN 1.4* 1.6* 1.6* 1.6*   No results for input(s): LIPASE, AMYLASE in the last 168 hours. No results for input(s): AMMONIA in the last 168 hours. CBC: Recent Labs  Lab 11/28/17 0547 11/29/17 0437 11/30/17 0631 12/01/17 0554 12/02/17 0313  WBC 6.1 6.2 5.5 5.3 6.7  NEUTROABS  --   --  3.0 3.3 4.3  HGB 8.3* 7.5* 7.7* 8.0* 8.3*  HCT 27.1* 24.6* 25.2* 25.3* 26.9*  MCV 62.9* 62.6* 62.8* 63.6* 64.0*  PLT 361 356 369 366 360   Cardiac Enzymes: Recent Labs  Lab 11/26/17 0200  TROPONINI <0.03   BNP: BNP (last 3 results) Recent Labs    11/26/17 0241  BNP 841.6*    ProBNP (last 3 results) No results for input(s): PROBNP in the last 8760 hours.  CBG: Recent Labs  Lab 12/01/17 0753 12/01/17 1136 12/01/17 1627 12/01/17 2104 12/02/17 0749  GLUCAP 131* 142* 120* 172* 122*       Signed:  Velta Addison Taren Dymek  [facility] 12/02/2017, 10:40 AM

## 2017-12-01 NOTE — Plan of Care (Signed)
  Education: Ability to verbalize understanding of medication therapies will improve 12/01/2017 0432 - Completed/Met by Evert Kohl, RN

## 2017-12-01 NOTE — Progress Notes (Signed)
[facility] Rounding Note  Background: 32 y.o. year-old woman PMH DM, HTN, CKD 3 (no labs since 10/2016 creatinine around 1.9 at that time GFR 30's) and chronic systolic HF (EF 30-35% 10/2016)  Was taking extra lasix 2/2 edema/SOB, then ran out totally and had none for some time. (Also ran out of insulin) Presented to ED 11/26/17 c/o  PND, orthopnea, exertional dyspnea. Elevated BP, creatinine 4.9, anemia with very low tsat, CXR vascular congestion. Last weight by cards 2018  191lb, adm 208. Asked to see because of creatinine of 5. No UA this adm (+ dip protein 10/2016; never quantitated), renal US 10.2, 10.4 no hydro.    Assessment/Recommendations  1. Renal failure - presume  AKI on CKD (cardiorenal vs uncontrolled DM/HTN) but cannot exclude natural progression of proteinuric CKD 2/2 DM.  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 Creat low 5's, leveling off.  Will need close renal f/u when dc'd.  Per cardiology to change to po diuretics. Will need to arrange outpt f/u at [facility]  2. Chronic systolic heart failure - EF 35-40%. Diuresing per cards. Avoid acei/ ARB/ aldactone 3. 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 PTH  5. Anemia with Fe def - s/p Feraheme. Aranesp dosed 200 on 3/7. Repeat labs outpt to determine further need. 6. Will proceed with IDDM - out of insulin for some unclear time period. A1C 7.6  Per primary team. Avoid further use metformin at current GFR  Camille Bal, MD [facility] [phone] Pager 12/01/2017, 2:35 PM   Subjective/Interval History:   Creat leveling off low 5's  Objective Vital signs in last 24 hours: Vitals:   11/30/17 1343 11/30/17 2102 12/01/17 0800 12/01/17 1227  BP: 133/78 121/70 127/68 (!) 146/86  Pulse: 96 91 84 87  Resp: 18 18  18   Temp: 98.4 F (36.9 C) 98.3 F (36.8 C)  98.6 F (37 C)  TempSrc: Oral Oral  Oral  SpO2: 100% 99%  95%  Weight:  91.6 kg (201 lb 14.4 oz)    Height:        Weight change: 0.363 kg (12.8 oz)  Intake/Output Summary (Last 24 hours) at 12/01/2017 1434 Last data filed at 12/01/2017 0551 Gross per 24 hour  Intake 360 ml  Output 1625 ml  Net -1265 ml   Physical Exam:  Blood pressure (!) 146/86, pulse 87, temperature 98.6 F (37 C), temperature source Oral, resp. rate 18, height 4\' 9"  (1.448 m), weight 91.6 kg (201 lb 14.4 oz), last menstrual period 10/29/2017, SpO2 95 %. Soft spoken AAF No weight today NAD VS noted JVD down Chest is clear bilat S1S2 No S3 or murmur.  Heart sounds somewhat distant Abdomen obese, no focal tenderness Poorly fitting TED hose 1+ pitting edema to knees no sig change   Recent Labs  Lab 11/26/17 0200 11/27/17 0502 11/28/17 0547 11/29/17 0437 11/30/17 0631 12/01/17 0554  NA 139 141 139 139 140 139  K 3.8 3.7 3.9 3.9 3.6 3.7  CL 109 110 108 109 109 107  CO2 20* 20* 21* 20* 21* 21*  GLUCOSE 203* 125* 146* 108* 109* 145*  BUN 52* 51* 51* 54* 54* 54*  CREATININE 4.94* 5.01* 5.03* 5.24* 5.27* 5.01*  CALCIUM 6.7* 7.0* 6.8* 7.0* 7.3* 7.4*  PHOS  --   --   --  5.7* 5.3* 5.3*    Recent Labs  Lab 11/29/17 0437 11/30/17 0631 12/01/17 0554  ALBUMIN 1.4* 1.6* 1.6*  Recent Labs  Lab 11/28/17 0547 11/29/17 0437 11/30/17 0631 12/01/17 0554  WBC 6.1 6.2 5.5 5.3  NEUTROABS  --   --  3.0 3.3  HGB 8.3* 7.5* 7.7* 8.0*  HCT 27.1* 24.6* 25.2* 25.3*  MCV 62.9* 62.6* 62.8* 63.6*  PLT 361 356 369 366    Recent Labs  Lab 11/26/17 0200  TROPONINI <0.03    Recent Labs  Lab 11/30/17 1143 11/30/17 1642 11/30/17 2117 12/01/17 0753 12/01/17 1136  GLUCAP 156* 127* 175* 131* 142*    Iron Studies:  Recent Labs  Lab 11/26/17 0622  IRON 19*  TIBC 245*  FERRITIN 9*   Studies/Results: Dg Chest 2 View  Result Date: 11/26/2017 CLINICAL DATA:  Shortness of breath EXAM: CHEST  2 VIEW COMPARISON:  10/26/2016, 10/12/2016 FINDINGS: Cardiomegaly with mild central vascular congestion. Streaky atelectasis at  the left lung base. Small focal opacity in the left mid lung, new compared to prior. No pneumothorax. IMPRESSION: 1. Oval focal opacity in the left mid lung, new compared to prior may represent focus of infection 2. Similar appearance of cardiomegaly with mild vascular congestion. 3. Partial but incomplete clearing of left basilar airspace disease. Electronically Signed   By: Jasmine Pang M.D.   On: 11/26/2017 02:45   US Renal  Result Date: 11/26/2017 CLINICAL DATA:  32 year old diabetic hypertensive female with renal failure today. Initial encounter. EXAM: RENAL / URINARY TRACT ULTRASOUND COMPLETE COMPARISON:  10/28/2016 renal sonogram. FINDINGS: Right Kidney: Length: 10.2 cm. Echogenicity within normal limits. No mass or hydronephrosis visualized. Left Kidney: Length: 10.4 cm. Echogenicity within normal limits. No mass or hydronephrosis visualized. Bladder: Appears normal for degree of bladder distention. Small bilateral pleural effusions. Uterine fibroids incompletely evaluated. IMPRESSION: Negative renal sonogram. Small bilateral pleural effusions. Uterine fibroids incompletely assessed. Electronically Signed   By: Lacy Duverney M.D.   On: 11/26/2017 07:02   Medications: . sodium chloride     . carvedilol  12.5 mg Oral BID WC  . darbepoetin (ARANESP) injection - NON-DIALYSIS  200 mcg Subcutaneous Q Thu-1800  . furosemide  60 mg Oral BID  . insulin aspart  0-5 Units Subcutaneous QHS  . insulin aspart  0-9 Units Subcutaneous TID WC  . isosorbide-hydrALAZINE  1 tablet Oral TID  . sevelamer carbonate  800 mg Oral TID WC  . sodium chloride flush  3 mL Intravenous Q12H

## 2017-12-01 NOTE — Progress Notes (Signed)
Patient ID: Lisa Hodges, female   DOB: 05-Sep-1986, 32 y.o.   MRN: [id number]     Advanced Heart Failure Rounding Note  Cardiologist: Shirlee Latch  Subjective:    Yesterday she continued to diurese with IV lasix. Weight overall down and she is breathing better. Creatinine 5.24>5.3>5.01.    Objective:   Weight Range: 201 lb 14.4 oz (91.6 kg) Body mass index is 43.69 kg/m.   Vital Signs:   Temp:  [98.3 F (36.8 C)-98.4 F (36.9 C)] 98.3 F (36.8 C) (03/08 2102) Pulse Rate:  [84-96] 84 (03/09 0800) Resp:  [18] 18 (03/08 2102) BP: (121-133)/(68-78) 127/68 (03/09 0800) SpO2:  [99 %-100 %] 99 % (03/08 2102) Weight:  [201 lb 14.4 oz (91.6 kg)] 201 lb 14.4 oz (91.6 kg) (03/08 2102) Last BM Date: 03/01/18  Weight change: Filed Weights   11/29/17 0500 11/30/17 0621 11/30/17 2102  Weight: 204 lb 9.6 oz (92.8 kg) 201 lb 1.6 oz (91.2 kg) 201 lb 14.4 oz (91.6 kg)    Intake/Output:   Intake/Output Summary (Last 24 hours) at 12/01/2017 1224 Last data filed at 12/01/2017 0551 Gross per 24 hour  Intake 360 ml  Output 1625 ml  Net -1265 ml      Physical Exam  General: NAD Neck: Thick no JVD, no thyromegaly or thyroid nodule.  Lungs: Clear to auscultation bilaterally with normal respiratory effort. CV: Nondisplaced PMI.  Heart regular S1/S2, no S3/S4, no murmur.  No peripheral edema.   Abdomen: Soft, nontender, no hepatosplenomegaly, no distention.  Skin: Intact without lesions or rashes.  Neurologic: Alert and oriented x 3.  Psych: Normal affect. Extremities: No clubbing or cyanosis.  HEENT: Normal.    Telemetry   NSR around 90, personally reviewed.   Labs    CBC Recent Labs    11/30/17 0631 12/01/17 0554  WBC 5.5 5.3  NEUTROABS 3.0 3.3  HGB 7.7* 8.0*  HCT 25.2* 25.3*  MCV 62.8* 63.6*  PLT 369 366   Basic Metabolic Panel Recent Labs    40/98/11 0631 12/01/17 0554  NA 140 139  K 3.6 3.7  CL 109 107  CO2 21* 21*  GLUCOSE 109* 145*  BUN 54* 54*  CREATININE 5.27*  5.01*  CALCIUM 7.3* 7.4*  PHOS 5.3* 5.3*   Liver Function Tests Recent Labs    11/30/17 0631 12/01/17 0554  ALBUMIN 1.6* 1.6*   No results for input(s): LIPASE, AMYLASE in the last 72 hours. Cardiac Enzymes No results for input(s): CKTOTAL, CKMB, CKMBINDEX, TROPONINI in the last 72 hours.  BNP: BNP (last 3 results) Recent Labs    11/26/17 0241  BNP 841.6*    ProBNP (last 3 results) No results for input(s): PROBNP in the last 8760 hours.   D-Dimer No results for input(s): DDIMER in the last 72 hours. Hemoglobin A1C No results for input(s): HGBA1C in the last 72 hours. Fasting Lipid Panel No results for input(s): CHOL, HDL, LDLCALC, TRIG, CHOLHDL, LDLDIRECT in the last 72 hours. Thyroid Function Tests No results for input(s): TSH, T4TOTAL, T3FREE, THYROIDAB in the last 72 hours.  Invalid input(s): FREET3  Other results:   Imaging    No results found.   Medications:     Scheduled Medications: . carvedilol  12.5 mg Oral BID WC  . darbepoetin (ARANESP) injection - NON-DIALYSIS  200 mcg Subcutaneous Q Thu-1800  . furosemide  60 mg Oral BID  . insulin aspart  0-5 Units Subcutaneous QHS  . insulin aspart  0-9 Units Subcutaneous TID WC  .  isosorbide-hydrALAZINE  1 tablet Oral TID  . sevelamer carbonate  800 mg Oral TID WC  . sodium chloride flush  3 mL Intravenous Q12H    Infusions: . sodium chloride    . ferumoxytol      PRN Medications: sodium chloride, acetaminophen, ALPRAZolam, alum & mag hydroxide-simeth, ondansetron (ZOFRAN) IV, sodium chloride flush, zolpidem    Assessment/Plan   1. Acute on chronic systolic CHF: Cardiomyopathy of uncertain etiology.  Echo 3/19 with EF 35-40%, moderately dilated LV.  Has RFs for CAD with type II diabetes with onset at age 69 and HTN. HIV negative. However, never had cath due to elevated creatinine.  Also could be related to prior viral myocarditis or even long-standing diabetes or HTN. SPEP negative. Admission  complicated by ?AKI with creatinine up to 5 on presentation.  She has diuresed well with IV Lasix and now looks euvolemic. Creatinine slightly down to 5.  - Increase Coreg to 12.5 mg bid.  - Continue 1 tab Bidil three times a day. - With her cardiac meds, she will need to be on some form of birth control.  2. 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  Cause of AKI uncertain, she had been taking escalating doses of Lasix at home, ?development of ATN.  Renal US unremarkable.  Suspect underlying diabetic nephropathy.   - Creatinine down slightly to 3.   - Nephrology following, will need to see as outpatient as well.  3. HTN: Coreg and Bidil, controlled. 4. DM: Started as type II on po meds at age 62. Now supposed to be on insulin but was out for several months and went back to metformin.  Surprisingly, hgbA1c was not particularly high.  Heavy proteinuria, likely diabetic nephropathy.  5. Anemia: Fe deficient, may be due to heavy menses.   - she got feraheme.   OK for discharge from my standpoint, will need CHF clinic followup.   Length of Stay: 5  Marca Ancona, MD  12/01/2017, 12:24 PM  Advanced Heart Failure Team Pager [phone] (M-F; 7a - 4p)  Please contact [facility] for night-coverage after hours (4p -7a ) and weekends on [email or web address]

## 2017-12-02 DIAGNOSIS — Z8679 Personal history of other diseases of the circulatory system: Secondary | ICD-10-CM

## 2017-12-02 LAB — RENAL FUNCTION PANEL
ALBUMIN: 1.6 g/dL — AB (ref 3.5–5.0)
ANION GAP: 8 (ref 5–15)
BUN: 55 mg/dL — AB (ref 6–20)
CO2: 22 mmol/L (ref 22–32)
Calcium: 7.4 mg/dL — ABNORMAL LOW (ref 8.9–10.3)
Chloride: 108 mmol/L (ref 101–111)
Creatinine, Ser: 5.22 mg/dL — ABNORMAL HIGH (ref 0.44–1.00)
GFR calc Af Amer: 12 mL/min — ABNORMAL LOW (ref >60)
GFR, EST NON AFRICAN AMERICAN: 10 mL/min — AB (ref >60)
Glucose, Bld: 139 mg/dL — ABNORMAL HIGH (ref 65–99)
PHOSPHORUS: 4.9 mg/dL — AB (ref 2.5–4.6)
POTASSIUM: 3.7 mmol/L (ref 3.5–5.1)
Sodium: 138 mmol/L (ref 135–145)

## 2017-12-02 LAB — CBC WITH DIFFERENTIAL/PLATELET
BASOS ABS: 0 10*3/uL (ref 0.0–0.1)
Basophils Relative: 0 %
Eosinophils Absolute: 0.1 10*3/uL (ref 0.0–0.7)
Eosinophils Relative: 2 %
HEMATOCRIT: 26.9 % — AB (ref 36.0–46.0)
Hemoglobin: 8.3 g/dL — ABNORMAL LOW (ref 12.0–15.0)
LYMPHS ABS: 1.8 10*3/uL (ref 0.7–4.0)
LYMPHS PCT: 27 %
MCH: 19.8 pg — ABNORMAL LOW (ref 26.0–34.0)
MCHC: 30.9 g/dL (ref 30.0–36.0)
MCV: 64 fL — ABNORMAL LOW (ref 78.0–100.0)
MONOS PCT: 8 %
Monocytes Absolute: 0.5 10*3/uL (ref 0.1–1.0)
Neutro Abs: 4.3 10*3/uL (ref 1.7–7.7)
Neutrophils Relative %: 63 %
Platelets: 360 10*3/uL (ref 150–400)
RBC: 4.2 MIL/uL (ref 3.87–5.11)
RDW: 16.9 % — ABNORMAL HIGH (ref 11.5–15.5)
WBC: 6.7 10*3/uL (ref 4.0–10.5)

## 2017-12-02 LAB — GLUCOSE, CAPILLARY
GLUCOSE-CAPILLARY: 122 mg/dL — AB (ref 65–99)
Glucose-Capillary: 139 mg/dL — ABNORMAL HIGH (ref 65–99)

## 2017-12-02 MED ORDER — "INSULIN SYRINGE 31G X 5/16"" 0.5 ML MISC": 0 refills | Status: DC

## 2017-12-02 MED ORDER — CARVEDILOL 12.5 MG PO TABS: 12.5000 mg | ORAL_TABLET | Freq: Two times a day (BID) | ORAL | 0 refills | Status: DC

## 2017-12-02 MED ORDER — ISOSORB DINITRATE-HYDRALAZINE 20-37.5 MG PO TABS: 1.0000 | ORAL_TABLET | Freq: Three times a day (TID) | ORAL | 0 refills | Status: DC

## 2017-12-02 MED ORDER — FUROSEMIDE 20 MG PO TABS: 60.0000 mg | ORAL_TABLET | Freq: Two times a day (BID) | ORAL | 0 refills | Status: DC

## 2017-12-02 MED ORDER — INSULIN ASPART PROT & ASPART (70-30 MIX) 100 UNIT/ML ~~LOC~~ SUSP: 8.0000 [IU] | Freq: Two times a day (BID) | SUBCUTANEOUS | 0 refills | Status: DC

## 2017-12-02 MED ORDER — SEVELAMER CARBONATE 800 MG PO TABS: 800.0000 mg | ORAL_TABLET | Freq: Three times a day (TID) | ORAL | 0 refills | Status: DC

## 2017-12-02 NOTE — Progress Notes (Signed)
Discharge patient home.   Discussed discharge instructions including medications and follow-up appointments.  Gave handout on Bidil and sevelamer

## 2017-12-02 NOTE — Plan of Care (Signed)
  Clinical Measurements: Ability to maintain clinical measurements within normal limits will improve 12/02/2017 0545 - Completed/Met by Evert Kohl, RN

## 2017-12-03 ENCOUNTER — Telehealth (HOSPITAL_COMMUNITY): Payer: Self-pay | Admitting: Surgery

## 2017-12-03 LAB — PARATHYROID HORMONE, INTACT (NO CA): PTH: 189 pg/mL — ABNORMAL HIGH (ref 15–65)

## 2017-12-03 NOTE — Telephone Encounter (Signed)
I called Lisa Hodges to check on her after recent hospitalization.  She tells me that she is feeling "good".  She has been weighing each day and reports her weight today is 197 lbs--versus discharge weight of 200 lbs on 12/02/17.  She does say that she has not yet received her medication- Renvela.  She tells me that the "social worker" is working on this for her to assist with prior authorization?  I reminded her of her appt on 12/07/17 at 1030 in the AHF clinic.  She says that she has transportation arranged and plans to be there.  I encouraged her to call with any concerns or questions related to her HF.

## 2017-12-03 NOTE — Telephone Encounter (Signed)
I called Lisa Hodges to check on her after her recent hospitalization.  She tells me that

## 2017-12-03 NOTE — Telephone Encounter (Signed)
Spoke with pt she stated she was in the hospital and her Lasix was increase 60 mg twice a day.

## 2017-12-04 ENCOUNTER — Other Ambulatory Visit: Payer: Self-pay | Admitting: Internal Medicine

## 2017-12-05 ENCOUNTER — Telehealth: Payer: Self-pay | Admitting: Licensed Clinical Social Worker

## 2017-12-05 ENCOUNTER — Telehealth (HOSPITAL_COMMUNITY): Payer: Self-pay

## 2017-12-05 NOTE — Telephone Encounter (Signed)
CSW referred to follow up with patient for resources. Patient is a 32yo female who lives with her boyfriend and 9yo daughter. Patient states she has medicaid and was able to get her medications. She stated that there was one (kidney medication) that required a change due to medicaid coverage but was resolved and she has all of her medications now. Patient confirmed transportation and denies any other concerns at this time. CSW offered support and encouraged patient to call if further needs arise. Patient will be seen in the clinic on Friday for follow up. Lasandra Beech, LCSW, CCSW-MCS [phone]

## 2017-12-06 NOTE — Telephone Encounter (Signed)
I called pt to introduce myself and I advised her that I will meet her Friday when she comes to the heart failure clinic.  This will be our initial [facility] visit.

## 2017-12-07 ENCOUNTER — Other Ambulatory Visit (HOSPITAL_COMMUNITY): Payer: Self-pay

## 2017-12-07 ENCOUNTER — Ambulatory Visit (HOSPITAL_COMMUNITY)
Admission: RE | Admit: 2017-12-07 | Discharge: 2017-12-07 | Disposition: A | Discharge status: Home / Self Care | Payer: Medicaid Other | Source: Ambulatory Visit | Attending: Internal Medicine | Admitting: Internal Medicine

## 2017-12-07 ENCOUNTER — Telehealth (HOSPITAL_COMMUNITY): Payer: Self-pay | Admitting: *Deleted

## 2017-12-07 VITALS — BP 126/86 | HR 84 | Wt 198.0 lb

## 2017-12-07 DIAGNOSIS — N183 Chronic kidney disease, stage 3 unspecified: Secondary | ICD-10-CM

## 2017-12-07 DIAGNOSIS — I429 Cardiomyopathy, unspecified: Secondary | ICD-10-CM | POA: Insufficient documentation

## 2017-12-07 DIAGNOSIS — D509 Iron deficiency anemia, unspecified: Secondary | ICD-10-CM | POA: Insufficient documentation

## 2017-12-07 DIAGNOSIS — Z794 Long term (current) use of insulin: Secondary | ICD-10-CM | POA: Diagnosis not present

## 2017-12-07 DIAGNOSIS — I10 Essential (primary) hypertension: Secondary | ICD-10-CM

## 2017-12-07 DIAGNOSIS — E1165 Type 2 diabetes mellitus with hyperglycemia: Secondary | ICD-10-CM | POA: Diagnosis not present

## 2017-12-07 DIAGNOSIS — I5023 Acute on chronic systolic (congestive) heart failure: Secondary | ICD-10-CM | POA: Insufficient documentation

## 2017-12-07 DIAGNOSIS — Z79899 Other long term (current) drug therapy: Secondary | ICD-10-CM | POA: Diagnosis not present

## 2017-12-07 DIAGNOSIS — I5022 Chronic systolic (congestive) heart failure: Secondary | ICD-10-CM | POA: Diagnosis not present

## 2017-12-07 DIAGNOSIS — I130 Hypertensive heart and chronic kidney disease with heart failure and stage 1 through stage 4 chronic kidney disease, or unspecified chronic kidney disease: Secondary | ICD-10-CM | POA: Insufficient documentation

## 2017-12-07 DIAGNOSIS — I2510 Atherosclerotic heart disease of native coronary artery without angina pectoris: Secondary | ICD-10-CM | POA: Insufficient documentation

## 2017-12-07 DIAGNOSIS — D508 Other iron deficiency anemias: Secondary | ICD-10-CM | POA: Diagnosis not present

## 2017-12-07 DIAGNOSIS — E1122 Type 2 diabetes mellitus with diabetic chronic kidney disease: Secondary | ICD-10-CM | POA: Diagnosis not present

## 2017-12-07 LAB — BASIC METABOLIC PANEL
Anion gap: 12 (ref 5–15)
BUN: 55 mg/dL — ABNORMAL HIGH (ref 6–20)
CO2: 20 mmol/L — ABNORMAL LOW (ref 22–32)
Calcium: 7.7 mg/dL — ABNORMAL LOW (ref 8.9–10.3)
Chloride: 110 mmol/L (ref 101–111)
Creatinine, Ser: 5.75 mg/dL — ABNORMAL HIGH (ref 0.44–1.00)
GFR calc Af Amer: 10 mL/min — ABNORMAL LOW (ref >60)
GFR, EST NON AFRICAN AMERICAN: 9 mL/min — AB (ref >60)
GLUCOSE: 72 mg/dL (ref 65–99)
POTASSIUM: 4.2 mmol/L (ref 3.5–5.1)
Sodium: 142 mmol/L (ref 135–145)

## 2017-12-07 MED ORDER — FUROSEMIDE 20 MG PO TABS: 60.0000 mg | ORAL_TABLET | Freq: Every day | ORAL | 3 refills | Status: DC

## 2017-12-07 MED ORDER — ISOSORB DINITRATE-HYDRALAZINE 20-37.5 MG PO TABS: 2.0000 | ORAL_TABLET | Freq: Three times a day (TID) | ORAL | 11 refills | Status: DC

## 2017-12-07 NOTE — Telephone Encounter (Signed)
Result Notes for Basic metabolic panel   Notes recorded by Georgina Peer, RN on 12/07/2017 at 2:10 PM EDT Patient called regarding labs. She is agreeable with plan. MAR updated, bmet ordered, and lab appt scheduled. ------  Notes recorded by Tonye Becket D, NP on 12/07/2017 at 12:55 PM EDT Please call and ask her to hold lasix for 2 days then restart lasix 60 mg daily. Repeat BMEt next week.

## 2017-12-07 NOTE — Progress Notes (Signed)
I stopped in to see Ms. Flood along with Amy Clegg-NP and [facility]--HF [employer].  Ms. Ridener says that she has been "doing well" since hospital discharge.  She was referred to HF [employer] and is open to their visits.  She seemed very grateful for any assistance provided through the HF Clinic.  I encouraged she and Dee to contact me with any concerns or questions.

## 2017-12-07 NOTE — Patient Instructions (Signed)
INCREASE Bidil to 2 tab three times daily.  Routine lab work today. Will notify you of abnormal results, otherwise no news is good news!  Follow up 2 weeks with Amy Clegg NP-C.  Take all medication as prescribed the day of your appointment. Bring all medications with you to your appointment.  Do the following things EVERYDAY: 1) Weigh yourself in the morning before breakfast. Write it down and keep it in a log. 2) Take your medicines as prescribed 3) Eat low salt foods-Limit salt (sodium) to 2000 mg per day.  4) Stay as active as you can everyday 5) Limit all fluids for the day to less than 2 liters

## 2017-12-07 NOTE — Progress Notes (Signed)
Paramedicine Encounter   Patient ID: Lisa Hodges , female,   DOB: [birth year],31 y.o.,  MRN: [id number]   Met patient in clinic today with provider Amy.   Pt stated that she's taking her meds without difficulty.  During the last hospital stay her A1C was elevated but she is now watching what she's eating and drinking. Pt takes care of her pediatric daughter who is disabled and non-verbal. Her boyfriend is blind but helps her out with her daughter.  Pt uses medicaid transportation to get . Pt's current EF 35-40. No issues with her getting her medications. Pt denies dizziness and bleeding.  Amy advised pt against pregnancy at this time.    She went to her pcp yesterday and she has an appointment with her kidney doctor on 4/4 . Pt is wearing pressure stockings. She's retaining fluid.  Amy advised against pt using mrs. Dash due to the potassium it has in it.  Pt agrees to [facility] visits and she signed the HIPPA form.      Today weight 198 (clinic)  **med changes: Increase bidil to 2 pills 3 times daily  Time spent with patient [place], EMT-Paramedic 12/07/2017   ACTION: Next visit planned for wednesday

## 2017-12-07 NOTE — Progress Notes (Signed)
Primary HF Cardiologist: Dr Aundra Dubin  Primary Physician: Lin Landsman, MD Cardiologist:  Dr Percival Spanish  HPI: Lisa Hodges is a 32 year old with a history of DMI, HTN, CKD Stage III, and chronic systolic heart failure.   Diagnosed with heart failure 10/2016 during hospital admit. She had been treated for pneumonia prior to admit. Diuresed with IV lasix and transitioned to lasix 40 mg po daily, hydralazine 10 mg tid, and imdur 15 mg daily. Discharge creatinine 1.89.   Last seen by Dr Percival Spanish in May 2018. At that time lasix was increased to 60 mg daily. Weight was 191 pounds.   Admitted 3/4 with volume overload. She had been out of insulin for over 1 month and taking medications intermittently. Diuresed with IV lasix and transitioned lasix 60 mg twice a day. Creatinine on admit was 4.9 so she was referred to nephrology and has follow up April 4th.   Today she returns for HF follow. Overall feeling better. Denies SOB/PND/Orthopnea. Appetite ok. No fever or chills. Weight at home has been stable. She is not sure what she  can or cant eat. Taking all medications . Lives with her blind boyfriend and disabled daughter. Daughter has CP and confined to a wheel chair. Transported to appointment by medicaid. Takes the bus to get groceries.   11/26/2017 Echo  Left ventricle: The cavity size was normal. Systolic function was moderately reduced. The estimated ejection fraction was in the range of 35% to 40%. Moderate diffuse hypokinesis with regional variations. Features are consistent with a pseudonormal left ventricular filling pattern, with concomitant abnormal relaxation and increased filling pressure (grade 2 diastolic dysfunction). Doppler parameters are consistent with high ventricular filling pressure. - Aortic valve: There was trivial regurgitation. - Mitral valve: There was mild regurgitation. - Pulmonary arteries: PA peak pressure: 38 mm Hg (S). - Pericardium, extracardiac: A small to  moderate, free-flowing pericardial effusion was identified circumferential to the heart. The fluid had no internal echoes.  10/2016  - Left ventricle: The cavity size was normal. Wall thickness was increased in a pattern of mild LVH. Systolic function was moderately to severely reduced. The estimated ejection fraction was in the range of 30% to 35%. Diffuse hypokinesis. - Aortic valve: There was mild regurgitation. - Mitral valve: There was mild regurgitation. - Pericardium, extracardiac: A small pericardial effusion was identified.  Lexi Scan 2018  Low risk study       ROS: All systems negative except as listed in HPI, PMH and Problem List.  SH:  Social History   Socioeconomic History  . Marital status: Single    Spouse name: Not on file  . Number of children: Not on file  . Years of education: Not on file  . Highest education level: Not on file  Social Needs  . Financial resource strain: Not on file  . Food insecurity - worry: Not on file  . Food insecurity - inability: Not on file  . Transportation needs - medical: Not on file  . Transportation needs - non-medical: Not on file  Occupational History  . Not on file  Tobacco Use  . Smoking status: Never Smoker  . Smokeless tobacco: Never Used  Substance and Sexual Activity  . Alcohol use: No  . Drug use: No  . Sexual activity: Not on file  Other Topics Concern  . Not on file  Social History Narrative  . Not on file    FH:  Family History  Problem Relation Age of Onset  . Diabetes  Mother   . Kidney failure Mother     Past Medical History:  Diagnosis Date  . CKD (chronic kidney disease)   . Diabetes mellitus without complication ([place])   . Hypertension   . Systolic CHF [facility])     Current Outpatient Medications  Medication Sig Dispense Refill  . ACCU-CHEK SOFTCLIX LANCETS lancets Use as instructed 100 each 12  . Blood Glucose Monitoring Suppl (ACCU-CHEK AVIVA PLUS) w/Device KIT Use as  directed 1 kit 0  . carvedilol (COREG) 12.5 MG tablet Take 1 tablet (12.5 mg total) by mouth 2 (two) times daily with a meal. 60 tablet 0  . furosemide (LASIX) 20 MG tablet Take 3 tablets (60 mg total) by mouth 2 (two) times daily. 60 tablet 0  . glucose blood (ACCU-CHEK AVIVA PLUS) test strip Use as instructed 100 each 12  . insulin aspart protamine- aspart (NOVOLOG MIX 70/30) (70-30) 100 UNIT/ML injection Inject 0.08 mLs (8 Units total) into the skin 2 (two) times daily with a meal. 4.8 mL 0  . Insulin Syringe-Needle U-100 (INSULIN SYRINGE .5CC/31GX5/16") 31G X 5/16" 0.5 ML MISC USE AS DIRECTED. 100 each 0  . isosorbide-hydrALAZINE (BIDIL) 20-37.5 MG tablet Take 1 tablet by mouth 3 (three) times daily. 90 tablet 0  . Lancets Misc. (ACCU-CHEK SOFTCLIX LANCET DEV) KIT Use as directed 1 kit 0  . sevelamer carbonate (RENVELA) 800 MG tablet Take 1 tablet (800 mg total) by mouth 3 (three) times daily with meals. 90 tablet 0   No current facility-administered medications for this encounter.     Vitals:   12/07/17 1058  BP: 126/86  Pulse: 84  SpO2: 100%  Weight: 198 lb (89.8 kg)    PHYSICAL EXAM: General:  Well appearing. No resp difficulty HEENT: normal Neck: supple. JVP flat. Carotids 2+ bilaterally; no bruits. No lymphadenopathy or thryomegaly appreciated. Cor: PMI normal. Regular rate & rhythm. No rubs, gallops or murmurs. Lungs: clear Abdomen: soft, nontender, nondistended. No hepatosplenomegaly. No bruits or masses. Good bowel sounds. Extremities: no cyanosis, clubbing, rash, edema Neuro: alert & orientedx3, cranial nerves grossly intact. Moves all 4 extremities w/o difficulty. Affect pleasant.  ASSESSMENT & PLAN: 1. Acute on chronic systolic CHF: Cardiomyopathy of uncertain etiology. Echo 3/19 with EF 35-40%, moderately dilated LV.  Has RFs for CAD with type II diabetes with onset at age 46 and HTN. HIV negative. However, never had cath due to elevated creatinine. Also could be  related to prior viral myocarditis or even long-standing diabetes or HTN. SPEP negative.Admission complicated by ?AKI with creatinine up to 5 on presentation.   Volume status stable. Continue current dose of lasix.  - Continue carvedilol 12. 17m twice a day.  Increase bidil to 2 tabs three ties a day.  She has follow up with PCP and I have asked her to discuss contraception with Dr RLoma Sousa   2. 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 Cause of AKI uncertain, she had been taking escalating doses of Lasix at home, ?development of ATN. Renal [place]remarkable. Suspect underlying diabetic nephropathy.  - Has follow up with Nephrology April 4th.   3. HNID:POEUMP.  4. DM: Started as type II on po meds at age 32  Surprisingly, hgbA1c was not particularly high.  Heavy proteinuria, likely diabetic nephropathy. Following closely with PCP  5. Anemia: Fe deficient, may be due to heavy menses.  - she got feraheme.  Follow up in 2 weeks. Plan to repeat  ECHo in few months. HF Paramedicine picked up today. Check BMET  Greater than 50% of the (total minutes 30) visit spent in counseling/coordination of care regarding heart failure, medications changes, and tests.   Darrick Grinder NP- C 12:53 PM

## 2017-12-12 ENCOUNTER — Ambulatory Visit (HOSPITAL_COMMUNITY)
Admission: RE | Admit: 2017-12-12 | Discharge: 2017-12-12 | Disposition: A | Discharge status: Home / Self Care | Payer: Medicaid Other | Source: Ambulatory Visit | Attending: Cardiology | Admitting: Cardiology

## 2017-12-12 ENCOUNTER — Other Ambulatory Visit (HOSPITAL_COMMUNITY): Payer: Self-pay

## 2017-12-12 DIAGNOSIS — I5022 Chronic systolic (congestive) heart failure: Secondary | ICD-10-CM | POA: Diagnosis present

## 2017-12-12 LAB — BASIC METABOLIC PANEL
Anion gap: 9 (ref 5–15)
BUN: 66 mg/dL — AB (ref 6–20)
CO2: 21 mmol/L — ABNORMAL LOW (ref 22–32)
Calcium: 7.8 mg/dL — ABNORMAL LOW (ref 8.9–10.3)
Chloride: 111 mmol/L (ref 101–111)
Creatinine, Ser: 5.31 mg/dL — ABNORMAL HIGH (ref 0.44–1.00)
GFR calc Af Amer: 11 mL/min — ABNORMAL LOW (ref >60)
GFR, EST NON AFRICAN AMERICAN: 10 mL/min — AB (ref >60)
GLUCOSE: 101 mg/dL — AB (ref 65–99)
POTASSIUM: 4.6 mmol/L (ref 3.5–5.1)
Sodium: 141 mmol/L (ref 135–145)

## 2017-12-12 NOTE — Progress Notes (Signed)
Paramedicine Encounter    Patient ID: Lisa Hodges, female    DOB: [birth year], 32 y.o.   MRN: [id number]    Patient Care Team: Lin Landsman, MD as PCP - General (Family Medicine)  Patient Active Problem List   Diagnosis Date Noted  . AKI (acute kidney injury) ([place])   . Acute on chronic renal failure ([place]) 11/26/2017  . Microcytic anemia 11/26/2017  . Hypertensive urgency 11/26/2017  . Medication management 01/29/2017  . Morbid obesity ([place]) 11/16/2016  . 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 Acute respiratory failure ([place]) 10/26/2016  . Acute on chronic systolic CHF (congestive heart failure) ([place]) 10/26/2016  . Chronic renal insufficiency, stage III (moderate) ([place]) 10/26/2016  . Hypertension 10/26/2016  . Insulin dependent diabetes mellitus with complications ([place]) 86/57/8469    Current Outpatient Medications:  .  carvedilol (COREG) 12.5 MG tablet, Take 1 tablet (12.5 mg total) by mouth 2 (two) times daily with a meal., Disp: 60 tablet, Rfl: 0 .  furosemide (LASIX) 20 MG tablet, Take 3 tablets (60 mg total) by mouth daily., Disp: 90 tablet, Rfl: 3 .  isosorbide-hydrALAZINE (BIDIL) 20-37.5 MG tablet, Take 2 tablets by mouth 3 (three) times daily., Disp: 180 tablet, Rfl: 11 .  ACCU-CHEK SOFTCLIX LANCETS lancets, Use as instructed, Disp: 100 each, Rfl: 12 .  Blood Glucose Monitoring Suppl (ACCU-CHEK AVIVA PLUS) w/Device KIT, Use as directed, Disp: 1 kit, Rfl: 0 .  glucose blood (ACCU-CHEK AVIVA PLUS) test strip, Use as instructed, Disp: 100 each, Rfl: 12 .  insulin aspart protamine- aspart (NOVOLOG MIX 70/30) (70-30) 100 UNIT/ML injection, Inject 0.08 mLs (8 Units total) into the skin 2 (two) times daily with a meal., Disp: 4.8 mL, Rfl: 0 .  Insulin Syringe-Needle U-100 (INSULIN SYRINGE .5CC/31GX5/16") 31G X 5/16" 0.5 ML MISC, USE AS DIRECTED., Disp: 100 each, Rfl: 0 .  Lancets Misc. (ACCU-CHEK SOFTCLIX LANCET DEV) KIT, Use as directed, Disp: 1 kit, Rfl:  0 .  sevelamer carbonate (RENVELA) 800 MG tablet, Take 1 tablet (800 mg total) by mouth 3 (three) times daily with meals., Disp: 90 tablet, Rfl: 0 No Known Allergies   Social History   Socioeconomic History  . Marital status: Single    Spouse name: Not on file  . Number of children: Not on file  . Years of education: Not on file  . Highest education level: Not on file  Social Needs  . Financial resource strain: Not on file  . Food insecurity - worry: Not on file  . Food insecurity - inability: Not on file  . Transportation needs - medical: Not on file  . Transportation needs - non-medical: Not on file  Occupational History  . Not on file  Tobacco Use  . Smoking status: Never Smoker  . Smokeless tobacco: Never Used  Substance and Sexual Activity  . Alcohol use: No  . Drug use: No  . Sexual activity: Not on file  Other Topics Concern  . Not on file  Social History Narrative  . Not on file    Physical Exam  Pulmonary/Chest: No respiratory distress.  Abdominal: She exhibits no distension. There is no tenderness.  Musculoskeletal: She exhibits edema.  Both feet and lower legs +2  Skin: Skin is warm and dry. She is not diaphoretic.        Future Appointments  Date Time Provider [place]  12/20/2017  2:00 PM [facility] PA/NP [facility] None  12/24/2017  2:30 PM Barrett, Evelene Croon, PA-C [facility]  ATF pt CAO x4 outside of her apartment. Pt stated that she was worried a couple of days ago because she ran out of test strips.  She was able to find some for her old meter and she used it to check her blood sugar.  Pt has medicaid and is able to get her meds without difficulty at this time.  She is trying to watch her sodium intake, therefore I will give her crockpot meals to try.  Her boyfriend phillip appears to be involved and supportive at this time.  Pt checks her blood pressure and weight daily; she records both.  Pt denies sob, dizziness and chest pain.  She  was given a diagram to help her understand how to much her fluid intake.    Pt still has edema in both lower legs and feet.  She stated that she doesn't elevate her leg as she should.  We also discussed her walking around the apartment complex or going to the gym in her complex to walk. She stated that she's tired a lot during the day but she's trying to be more active.  rx bottles verified and she was given a pill box.  Pt was shown how use the pill box.    BP 140/90 (BP Location: Left Arm, Patient Position: Sitting, Cuff Size: Large)   Pulse 85   Resp 16   Wt 201 lb (91.2 kg)   SpO2 98%   BMI 43.50 kg/m  cbg 133  Weight yesterday-201     Terren Haberle, EMT Paramedic 12/12/2017    ACTION: Home visit completed Next visit planned for next wednesday

## 2017-12-20 ENCOUNTER — Ambulatory Visit (HOSPITAL_COMMUNITY)
Admission: RE | Admit: 2017-12-20 | Discharge: 2017-12-20 | Disposition: A | Discharge status: Home / Self Care | Payer: Medicaid Other | Source: Ambulatory Visit | Attending: Cardiology | Admitting: Cardiology

## 2017-12-20 ENCOUNTER — Other Ambulatory Visit (HOSPITAL_COMMUNITY): Payer: Self-pay

## 2017-12-20 ENCOUNTER — Encounter (HOSPITAL_COMMUNITY): Payer: Self-pay

## 2017-12-20 VITALS — BP 124/72 | HR 85 | Wt 210.0 lb

## 2017-12-20 DIAGNOSIS — E1122 Type 2 diabetes mellitus with diabetic chronic kidney disease: Secondary | ICD-10-CM | POA: Diagnosis not present

## 2017-12-20 DIAGNOSIS — I2510 Atherosclerotic heart disease of native coronary artery without angina pectoris: Secondary | ICD-10-CM | POA: Insufficient documentation

## 2017-12-20 DIAGNOSIS — N183 Chronic kidney disease, stage 3 unspecified: Secondary | ICD-10-CM

## 2017-12-20 DIAGNOSIS — I130 Hypertensive heart and chronic kidney disease with heart failure and stage 1 through stage 4 chronic kidney disease, or unspecified chronic kidney disease: Secondary | ICD-10-CM | POA: Diagnosis not present

## 2017-12-20 DIAGNOSIS — N179 Acute kidney failure, unspecified: Secondary | ICD-10-CM | POA: Diagnosis not present

## 2017-12-20 DIAGNOSIS — I429 Cardiomyopathy, unspecified: Secondary | ICD-10-CM | POA: Insufficient documentation

## 2017-12-20 DIAGNOSIS — D509 Iron deficiency anemia, unspecified: Secondary | ICD-10-CM | POA: Insufficient documentation

## 2017-12-20 DIAGNOSIS — Z794 Long term (current) use of insulin: Secondary | ICD-10-CM | POA: Insufficient documentation

## 2017-12-20 DIAGNOSIS — R809 Proteinuria, unspecified: Secondary | ICD-10-CM | POA: Diagnosis not present

## 2017-12-20 DIAGNOSIS — I10 Essential (primary) hypertension: Secondary | ICD-10-CM | POA: Diagnosis not present

## 2017-12-20 DIAGNOSIS — I5023 Acute on chronic systolic (congestive) heart failure: Secondary | ICD-10-CM | POA: Insufficient documentation

## 2017-12-20 DIAGNOSIS — Z79899 Other long term (current) drug therapy: Secondary | ICD-10-CM | POA: Diagnosis not present

## 2017-12-20 DIAGNOSIS — I5022 Chronic systolic (congestive) heart failure: Secondary | ICD-10-CM | POA: Diagnosis not present

## 2017-12-20 MED ORDER — FUROSEMIDE 20 MG PO TABS: 60.0000 mg | ORAL_TABLET | Freq: Two times a day (BID) | ORAL | 11 refills | Status: DC

## 2017-12-20 NOTE — Progress Notes (Signed)
Primary HF Cardiologist: Dr Aundra Dubin  Primary Physician: Lin Landsman, MD Cardiologist:  Dr Percival Spanish  HPI: Lisa Hodges is a 32 year old with a history of DMI, HTN, CKD Stage III, and chronic systolic heart failure.   Diagnosed with heart failure 10/2016 during hospital admit. She had been treated for pneumonia prior to admit. Diuresed with IV lasix and transitioned to lasix 40 mg po daily, hydralazine 10 mg tid, and imdur 15 mg daily. Discharge creatinine 1.89.   Last seen by Dr Percival Spanish in May 2018. At that time lasix was increased to 60 mg daily. Weight was 191 pounds.   Admitted 3/4 with volume overload. She had been out of insulin for over 1 month and taking medications intermittently. Diuresed with IV lasix and transitioned lasix 60 mg twice a day. Creatinine on admit was 4.9 so she was referred to nephrology and has follow up April 4th.   Today she returns for HF follow up. Last visit lasix was cut back due to elevated creatinine however weight has been trending up. Overall feeling fair. SOB with exertion. Denies PND. + Orthopnea. Increased leg edema. Appetite ok. No fever or chills. Weight at home 204-206  pounds. Missed a couple days of medications due to a cold. Lives with her blind boyfriend and disabled daughter. Daughter has CP and confined to a wheel chair. Transported to appointment by medicaid. Takes the bus to get groceries. Followed by Paramedicine.   11/26/2017 Echo  Left ventricle: The cavity size was normal. Systolic function was moderately reduced. The estimated ejection fraction was in the range of 35% to 40%. Moderate diffuse hypokinesis with regional variations. Features are consistent with a pseudonormal left ventricular filling pattern, with concomitant abnormal relaxation and increased filling pressure (grade 2 diastolic dysfunction). Doppler parameters are consistent with high ventricular filling pressure. - Aortic valve: There was trivial  regurgitation. - Mitral valve: There was mild regurgitation. - Pulmonary arteries: PA peak pressure: 38 mm Hg (S). - Pericardium, extracardiac: A small to moderate, free-flowing pericardial effusion was identified circumferential to the heart. The fluid had no internal echoes.  10/2016  - Left ventricle: The cavity size was normal. Wall thickness was increased in a pattern of mild LVH. Systolic function was moderately to severely reduced. The estimated ejection fraction was in the range of 30% to 35%. Diffuse hypokinesis. - Aortic valve: There was mild regurgitation. - Mitral valve: There was mild regurgitation. - Pericardium, extracardiac: A small pericardial effusion was identified.  Lexi Scan 2018  Low risk study   ROS: All systems negative except as listed in HPI, PMH and Problem List.  SH:  Social History   Socioeconomic History  . Marital status: Single    Spouse name: Not on file  . Number of children: Not on file  . Years of education: Not on file  . Highest education level: Not on file  Occupational History  . Not on file  Social Needs  . Financial resource strain: Not on file  . Food insecurity:    Worry: Not on file    Inability: Not on file  . Transportation needs:    Medical: Not on file    Non-medical: Not on file  Tobacco Use  . Smoking status: Never Smoker  . Smokeless tobacco: Never Used  Substance and Sexual Activity  . Alcohol use: No  . Drug use: No  . Sexual activity: Not on file  Lifestyle  . Physical activity:    Days per week: Not on file  Minutes per session: Not on file  . Stress: Not on file  Relationships  . Social connections:    Talks on phone: Not on file    Gets together: Not on file    Attends religious service: Not on file    Active member of club or organization: Not on file    Attends meetings of clubs or organizations: Not on file    Relationship status: Not on file  . Intimate partner violence:    Fear  of current or ex partner: Not on file    Emotionally abused: Not on file    Physically abused: Not on file    Forced sexual activity: Not on file  Other Topics Concern  . Not on file  Social History Narrative  . Not on file    FH:  Family History  Problem Relation Age of Onset  . Diabetes Mother   . Kidney failure Mother     Past Medical History:  Diagnosis Date  . CKD (chronic kidney disease)   . Diabetes mellitus without complication ([place])   . Hypertension   . Systolic CHF [facility])     Current Outpatient Medications  Medication Sig Dispense Refill  . ACCU-CHEK SOFTCLIX LANCETS lancets Use as instructed 100 each 12  . Blood Glucose Monitoring Suppl (ACCU-CHEK AVIVA PLUS) w/Device KIT Use as directed 1 kit 0  . carvedilol (COREG) 12.5 MG tablet Take 1 tablet (12.5 mg total) by mouth 2 (two) times daily with a meal. 60 tablet 0  . furosemide (LASIX) 20 MG tablet Take 3 tablets (60 mg total) by mouth daily. 90 tablet 3  . glucose blood (ACCU-CHEK AVIVA PLUS) test strip Use as instructed 100 each 12  . insulin aspart protamine- aspart (NOVOLOG MIX 70/30) (70-30) 100 UNIT/ML injection Inject 0.08 mLs (8 Units total) into the skin 2 (two) times daily with a meal. 4.8 mL 0  . Insulin Syringe-Needle U-100 (INSULIN SYRINGE .5CC/31GX5/16") 31G X 5/16" 0.5 ML MISC USE AS DIRECTED. 100 each 0  . isosorbide-hydrALAZINE (BIDIL) 20-37.5 MG tablet Take 2 tablets by mouth 3 (three) times daily. 180 tablet 11  . Lancets Misc. (ACCU-CHEK SOFTCLIX LANCET DEV) KIT Use as directed 1 kit 0  . sevelamer carbonate (RENVELA) 800 MG tablet Take 1 tablet (800 mg total) by mouth 3 (three) times daily with meals. 90 tablet 0   No current facility-administered medications for this encounter.     Vitals:   12/20/17 1346  BP: 124/72  Pulse: 85  SpO2: 100%  Weight: 210 lb (95.3 kg)   Filed Weights   12/20/17 1346  Weight: 210 lb (95.3 kg)   Vitals - 1 value per visit 12/20/2017 12/12/2017 12/07/2017  12/07/2017  Weight (lb) 210 201  198    PHYSICAL EXAM: General:  . No resp difficulty. Walked slowly in the clinic HEENT: normal Neck: supple. JVP to jaw. Carotids 2+ bilat; no bruits. No lymphadenopathy or thryomegaly appreciated. Cor: PMI nondisplaced. Regular rate & rhythm. No rubs, gallops or murmurs. Lungs: clear on room air.  Abdomen: soft, nontender, nondistended. No hepatosplenomegaly. No bruits or masses. Good bowel sounds. Extremities: no cyanosis, clubbing, rash, R and LLE 3+ edema Neuro: alert & orientedx3, cranial nerves grossly intact. moves all 4 extremities w/o difficulty. Affect pleasant   ASSESSMENT & PLAN: 1. Acute on chronic systolic CHF: Cardiomyopathy of uncertain etiology. Echo 3/19 with EF 35-40%, moderately dilated LV.  Has RFs for CAD with type II diabetes with onset at age 67 and  HTN. HIV negative. However, never had cath due to elevated creatinine. Also could be related to prior viral myocarditis or even long-standing diabetes or HTN. SPEP negative.Admission complicated by ?AKI with creatinine up to 5 on presentation.   - volume status elevated. Increase lasix back to 60 mg twice a day.  - Continue carvedilol 12. 90m twice a day.  - continue bidil 2 tabs three times a day.    2. 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 Cause of AKI uncertain, she had been taking escalating doses of Lasix at home, ?development of ATN. Renal [place]unremarkable. Suspect underlying diabetic nephropathy.  - last visit creatinine elevated. Lasix was cut back to 60 mg daily but today she is volume overloaded.  - Has follow up with Nephrology April 4th.   3. HTN: - Improved.    .  4. DM: Started as type II on po meds at age 32  Surprisingly, hgbA1c was not particularly high.  Heavy proteinuria, likely diabetic nephropathy. Following closely with PCP  5. Anemia: Fe deficient, may be due to heavy menses.  - she got  feraheme.  Follow up 2 weeks. I personally discussed with Paramedicine today all medication changes.   ADarrick GrinderNP- C 2:39 PM

## 2017-12-20 NOTE — Progress Notes (Signed)
Paramedicine Encounter   Patient ID: Lisa Hodges , female,   DOB: 04-Jan-1986,31 y.o.,  MRN: [id number]   Met patient in clinic today with provider Amy.   Pt reports some sob when she has to walk a lot.  She stated that it's better than it was last week but "just walking around the apartment she gets sob".  Pt stated that she is continuing to stay away from foods high in sodium and sugar.  We discussed how to read labels. I had to leave the clinic prior to her seeing the NP due to them running a little behind.    Amy called me during the visit and advised of med changes.   **rx changes: Furosemide increased to 21m BID  Time spent with patient 40 mins  Lisa Hodges, EMT-Paramedic 12/20/2017   ACTION: Next visit planned for tomorrow

## 2017-12-20 NOTE — Patient Instructions (Signed)
INCREASE Lasix to 60 mg (3 tabs) twice daily.  Follow up 2 weeks with Amy Clegg NP-C.  ________________________________________________________ [place] Code: 1100  Take all medication as prescribed the day of your appointment. Bring all medications with you to your appointment.  Do the following things EVERYDAY: 1) Weigh yourself in the morning before breakfast. Write it down and keep it in a log. 2) Take your medicines as prescribed 3) Eat low salt foods-Limit salt (sodium) to 2000 mg per day.  4) Stay as active as you can everyday 5) Limit all fluids for the day to less than 2 liters

## 2017-12-21 ENCOUNTER — Encounter (HOSPITAL_COMMUNITY): Payer: Self-pay

## 2017-12-21 ENCOUNTER — Other Ambulatory Visit (HOSPITAL_COMMUNITY): Payer: Self-pay

## 2017-12-21 NOTE — Progress Notes (Signed)
Paramedicine Encounter    Patient ID: Lisa Hodges, female    DOB: Mar 28, 1986, 32 y.o.   MRN: [id number]    Patient Care Team: Lin Landsman, MD as PCP - General (Family Medicine)  Patient Active Problem List   Diagnosis Date Noted  . AKI (acute kidney injury) ([place])   . Acute on chronic renal failure ([place]) 11/26/2017  . Microcytic anemia 11/26/2017  . Hypertensive urgency 11/26/2017  . Medication management 01/29/2017  . Morbid obesity ([place]) 11/16/2016  . 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 Acute respiratory failure ([place]) 10/26/2016  . Acute on chronic systolic CHF (congestive heart failure) ([place]) 10/26/2016  . Chronic renal insufficiency, stage III (moderate) ([place]) 10/26/2016  . Hypertension 10/26/2016  . Insulin dependent diabetes mellitus with complications ([place]) 58/05/9832    Current Outpatient Medications:  .  carvedilol (COREG) 12.5 MG tablet, Take 1 tablet (12.5 mg total) by mouth 2 (two) times daily with a meal., Disp: 60 tablet, Rfl: 0 .  furosemide (LASIX) 20 MG tablet, Take 3 tablets (60 mg total) by mouth 2 (two) times daily., Disp: 180 tablet, Rfl: 11 .  glucose blood (ACCU-CHEK AVIVA PLUS) test strip, Use as instructed, Disp: 100 each, Rfl: 12 .  insulin aspart protamine- aspart (NOVOLOG MIX 70/30) (70-30) 100 UNIT/ML injection, Inject 0.08 mLs (8 Units total) into the skin 2 (two) times daily with a meal., Disp: 4.8 mL, Rfl: 0 .  Insulin Syringe-Needle U-100 (INSULIN SYRINGE .5CC/31GX5/16") 31G X 5/16" 0.5 ML MISC, USE AS DIRECTED., Disp: 100 each, Rfl: 0 .  isosorbide-hydrALAZINE (BIDIL) 20-37.5 MG tablet, Take 2 tablets by mouth 3 (three) times daily., Disp: 180 tablet, Rfl: 11 .  Lancets Misc. (ACCU-CHEK SOFTCLIX LANCET DEV) KIT, Use as directed, Disp: 1 kit, Rfl: 0 .  ACCU-CHEK SOFTCLIX LANCETS lancets, Use as instructed, Disp: 100 each, Rfl: 12 .  Blood Glucose Monitoring Suppl (ACCU-CHEK AVIVA PLUS) w/Device KIT, Use as directed,  Disp: 1 kit, Rfl: 0 .  sevelamer carbonate (RENVELA) 800 MG tablet, Take 1 tablet (800 mg total) by mouth 3 (three) times daily with meals., Disp: 90 tablet, Rfl: 0 No Known Allergies   Social History   Socioeconomic History  . Marital status: Single    Spouse name: Not on file  . Number of children: Not on file  . Years of education: Not on file  . Highest education level: Not on file  Occupational History  . Not on file  Social Needs  . Financial resource strain: Not on file  . Food insecurity:    Worry: Not on file    Inability: Not on file  . Transportation needs:    Medical: Not on file    Non-medical: Not on file  Tobacco Use  . Smoking status: Never Smoker  . Smokeless tobacco: Never Used  Substance and Sexual Activity  . Alcohol use: No  . Drug use: No  . Sexual activity: Not on file  Lifestyle  . Physical activity:    Days per week: Not on file    Minutes per session: Not on file  . Stress: Not on file  Relationships  . Social connections:    Talks on phone: Not on file    Gets together: Not on file    Attends religious service: Not on file    Active member of club or organization: Not on file    Attends meetings of clubs or organizations: Not on file    Relationship status: Not on file  .  Intimate partner violence:    Fear of current or ex partner: Not on file    Emotionally abused: Not on file    Physically abused: Not on file    Forced sexual activity: Not on file  Other Topics Concern  . Not on file  Social History Narrative  . Not on file    Physical Exam      Future Appointments  Date Time Provider [place]  12/24/2017  2:30 PM Barrett, Felisa Bonier [facility]  01/03/2018  3:00 PM [facility] PA/NP [facility] None    ATF pt CAO x4 getting her daughter ready for the day.  Pt stated that she did not sleep good last night.  Pt's eyes are swollen, she said from allergies.  Pt has already taken her meds this morning.  Pt denies sob  and chest pain.  Today is re-visit for med rec.  Pt's lasix was increased 29m BID.  rx bottles verified and pill box refiiled.   Pt is wearing the compression stockings.  BP 118/78 (BP Location: Left Arm, Patient Position: Sitting, Cuff Size: Large)   Pulse 95   Resp 16   Wt 206 lb (93.4 kg)   SpO2 98%   BMI 44.58 kg/m   Weight yesterday-206 Last visit weight-201    Lillyann Ahart, EMT Paramedic 12/21/2017    ACTION: Home visit completed

## 2017-12-24 ENCOUNTER — Telehealth: Payer: Self-pay

## 2017-12-24 ENCOUNTER — Ambulatory Visit: Payer: Medicaid Other | Admitting: Physician Assistant

## 2017-12-24 NOTE — Telephone Encounter (Signed)
This encounter was created in error - please disregard.

## 2017-12-24 NOTE — Telephone Encounter (Signed)
Pt aware she does not need appointment today in our office. Told her about 6/7 appt. With DR. Hochrein and reminded her of HF Clinic appt on 4/11. Pt verbalized understanding no additional questions at this time.

## 2017-12-24 NOTE — Progress Notes (Deleted)
Cardiology Office Note   Date:  12/24/2017   ID:  Lisa Hodges, DOB 02-27-1986, MRN [id number]  PCP:  Lin Landsman, MD  Cardiologist:  Dr Percival Spanish, 01/2017 Darrick Grinder, NP CHF Clinic 12/20/2017  Rosaria Ferries, PA-C   No chief complaint on file.   History of Present Illness: Lisa Hodges is a 32 y.o. female with a history of DMI, HTN, CKD Stage III-IV, and chronic systolic heart failure.  03/28 office visit, wt 204-206 lbs at home, 210 in clinic, +volume overload, Lasix increased to 60 mg bid 03/29 Paramedicine visit, meds confirmed, wt [place] presents for ***   Past Medical History:  Diagnosis Date  . CKD (chronic kidney disease)   . Diabetes mellitus without complication ([place]isa Hodges)   . Hypertension   . Systolic CHF [facility])     Past Surgical History:  Procedure Laterality Date  . CESAREAN SECTION      Current Outpatient Medications  Medication Sig Dispense Refill  . ACCU-CHEK SOFTCLIX LANCETS lancets Use as instructed 100 each 12  . Blood Glucose Monitoring Suppl (ACCU-CHEK AVIVA PLUS) w/Device KIT Use as directed 1 kit 0  . carvedilol (COREG) 12.5 MG tablet Take 1 tablet (12.5 mg total) by mouth 2 (two) times daily with a meal. 60 tablet 0  . furosemide (LASIX) 20 MG tablet Take 3 tablets (60 mg total) by mouth 2 (two) times daily. 180 tablet 11  . glucose blood (ACCU-CHEK AVIVA PLUS) test strip Use as instructed 100 each 12  . insulin aspart protamine- aspart (NOVOLOG MIX 70/30) (70-30) 100 UNIT/ML injection Inject 0.08 mLs (8 Units total) into the skin 2 (two) times daily with a meal. 4.8 mL 0  . Insulin Syringe-Needle U-100 (INSULIN SYRINGE .5CC/31GX5/16") 31G X 5/16" 0.5 ML MISC USE AS DIRECTED. 100 each 0  . isosorbide-hydrALAZINE (BIDIL) 20-37.5 MG tablet Take 2 tablets by mouth 3 (three) times daily. 180 tablet 11  . Lancets Misc. (ACCU-CHEK SOFTCLIX LANCET DEV) KIT Use as directed 1 kit 0  . sevelamer carbonate (RENVELA) 800 MG tablet Take 1 tablet (800  mg total) by mouth 3 (three) times daily with meals. 90 tablet 0   No current facility-administered medications for this visit.     Allergies:   Patient has no known allergies.    Social History:  The patient  reports that she has never smoked. She has never used smokeless tobacco. She reports that she does not drink alcohol or use drugs.   Family History:  The patient's family history includes Diabetes in her mother; Kidney failure in her mother.    ROS:  Please see the history of present illness. All other systems are reviewed and negative.    PHYSICAL EXAM: VS:  There were no vitals taken for this visit. , BMI There is no height or weight on file to calculate BMI. GEN: Well nourished, well developed, female in no acute distress  HEENT: normal for age  Neck: no JVD, no carotid bruit, no masses Cardiac: RRR; no murmur, no rubs, or gallops Respiratory:  clear to auscultation bilaterally, normal work of breathing GI: soft, nontender, nondistended, + BS MS: no deformity or atrophy; no edema; distal pulses are 2+ in all 4 extremities   Skin: warm and dry, no rash Neuro:  Strength and sensation are intact Psych: euthymic mood, full affect   EKG:  EKG {ACTION; IS/IS GSU:[id number]} ordered today. The ekg ordered today demonstrates ***   Recent Labs: 11/26/2017: B Natriuretic Peptide 841.6 12/02/2017:  Hemoglobin 8.3; Platelets 360 12/12/2017: BUN 66; Creatinine, Ser 5.31; Potassium 4.6; Sodium 141    Lipid Panel No results found for: CHOL, TRIG, HDL, CHOLHDL, VLDL, LDLCALC, LDLDIRECT   Wt Readings from Last 3 Encounters:  12/21/17 206 lb (93.4 kg)  12/20/17 210 lb (95.3 kg)  12/12/17 201 lb (91.2 kg)     Other studies Reviewed: Additional studies/ records that were reviewed today include: ***.  ASSESSMENT AND PLAN:  1.  ***   Current medicines are reviewed at length with the patient today.  The patient {ACTIONS; HAS/DOES NOT HAVE:19233} concerns regarding  medicines.  The following changes have been made:  {PLAN; NO CHANGE:13088:s}  Labs/ tests ordered today include: *** No orders of the defined types were placed in this encounter.    Disposition:   FU with Dr Percival Spanish  Signed, Rosaria Ferries, PA-C  12/24/2017 8:58 AM    [place] Phone: [phone]; Fax: [phone]  This note was written with the assistance of speech recognition software. Please excuse any transcriptional errors.

## 2017-12-24 NOTE — Telephone Encounter (Signed)
Pt had recent appointment in HF clinic does not need today's appt 4/1; called LM for pt to call back so she knows she does not need today's appointment; scheduled 3 month f/u with [facility] 6/7 @ 1:40p. Pt has f/u scheduled 4/11 in HF clinic.

## 2017-12-31 ENCOUNTER — Other Ambulatory Visit: Payer: Self-pay

## 2017-12-31 DIAGNOSIS — N185 Chronic kidney disease, stage 5: Secondary | ICD-10-CM

## 2018-01-01 ENCOUNTER — Other Ambulatory Visit (HOSPITAL_COMMUNITY): Payer: Medicaid Other

## 2018-01-01 ENCOUNTER — Telehealth: Payer: Self-pay | Admitting: Licensed Clinical Social Worker

## 2018-01-01 ENCOUNTER — Other Ambulatory Visit (HOSPITAL_COMMUNITY): Payer: Self-pay

## 2018-01-01 ENCOUNTER — Telehealth (HOSPITAL_COMMUNITY): Payer: Self-pay | Admitting: *Deleted

## 2018-01-01 ENCOUNTER — Encounter (HOSPITAL_COMMUNITY): Payer: Self-pay

## 2018-01-01 ENCOUNTER — Encounter (HOSPITAL_COMMUNITY): Payer: Medicaid Other

## 2018-01-01 ENCOUNTER — Telehealth: Payer: Self-pay | Admitting: Cardiology

## 2018-01-01 MED ORDER — FUROSEMIDE 40 MG PO TABS: 80.0000 mg | ORAL_TABLET | Freq: Two times a day (BID) | ORAL | 3 refills | Status: DC

## 2018-01-01 NOTE — Telephone Encounter (Signed)
Received records from [facility] on 12/31/17, Appt 03/01/18 @ 1:40pm. [place]

## 2018-01-01 NOTE — Telephone Encounter (Signed)
Patient has been taking over the counter iron 100 mg daily. Will send to Dr. Shirlee Latch to verify this is ok for patient to continue.

## 2018-01-01 NOTE — Telephone Encounter (Signed)
Called and spoke with patient and instructed her to increase lasix to 80 mg BID.  MAR updated. No further questions.

## 2018-01-01 NOTE — Telephone Encounter (Signed)
CSW received request from [facility] paramedic to contact patient as she was told yesterday that she may need dialysis. Patient shared the news form the MD and states "I'm scared" and has been told various stories by others of their experiences with dialysis and kidney transplants. Patient reports her sister has not been very supportive and has made comments to her that she "may die". Patient feeling overwhelmed with her current medical issues and unsure of what lies ahead. Patient has a follow up appointment with the kidney MD on the 18th and states she is hopeful for further information and a plan for her care. CSW provided supportive intervention and will continue to coordinate care with [employer] program. Lasandra Beech, LCSW, CCSW-MCS [phone]

## 2018-01-01 NOTE — Telephone Encounter (Signed)
   Please call.   Instruct her to take lasix 80 mg twice a day.   Ernestyne Caldwell NP-C 2:55 PM

## 2018-01-01 NOTE — Progress Notes (Signed)
Paramedicine Encounter    Patient ID: Lisa Hodges, female    DOB: 11-01-1985, 32 y.o.   MRN: [id number]    Patient Care Team: Lin Landsman, MD as PCP - General (Family Medicine)  Patient Active Problem List   Diagnosis Date Noted  . AKI (acute kidney injury) ([place])   . Acute on chronic renal failure ([place]) 11/26/2017  . Microcytic anemia 11/26/2017  . Hypertensive urgency 11/26/2017  . Medication management 01/29/2017  . Morbid obesity ([place]) 11/16/2016  . 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 Acute respiratory failure ([place]) 10/26/2016  . Acute on chronic systolic CHF (congestive heart failure) ([place]) 10/26/2016  . Chronic renal insufficiency, stage III (moderate) ([place]) 10/26/2016  . Hypertension 10/26/2016  . Insulin dependent diabetes mellitus with complications ([place]) 35/36/1443    Current Outpatient Medications:  .  ACCU-CHEK SOFTCLIX LANCETS lancets, Use as instructed, Disp: 100 each, Rfl: 12 .  Blood Glucose Monitoring Suppl (ACCU-CHEK AVIVA PLUS) w/Device KIT, Use as directed, Disp: 1 kit, Rfl: 0 .  carvedilol (COREG) 12.5 MG tablet, Take 1 tablet (12.5 mg total) by mouth 2 (two) times daily with a meal., Disp: 60 tablet, Rfl: 0 .  furosemide (LASIX) 20 MG tablet, Take 3 tablets (60 mg total) by mouth 2 (two) times daily., Disp: 180 tablet, Rfl: 11 .  glucose blood (ACCU-CHEK AVIVA PLUS) test strip, Use as instructed, Disp: 100 each, Rfl: 12 .  insulin aspart protamine- aspart (NOVOLOG MIX 70/30) (70-30) 100 UNIT/ML injection, Inject 0.08 mLs (8 Units total) into the skin 2 (two) times daily with a meal., Disp: 4.8 mL, Rfl: 0 .  Insulin Syringe-Needle U-100 (INSULIN SYRINGE .5CC/31GX5/16") 31G X 5/16" 0.5 ML MISC, USE AS DIRECTED., Disp: 100 each, Rfl: 0 .  isosorbide-hydrALAZINE (BIDIL) 20-37.5 MG tablet, Take 2 tablets by mouth 3 (three) times daily., Disp: 180 tablet, Rfl: 11 .  Lancets Misc. (ACCU-CHEK SOFTCLIX LANCET DEV) KIT, Use as directed,  Disp: 1 kit, Rfl: 0 .  sevelamer carbonate (RENVELA) 800 MG tablet, Take 1 tablet (800 mg total) by mouth 3 (three) times daily with meals., Disp: 90 tablet, Rfl: 0 No Known Allergies   Social History   Socioeconomic History  . Marital status: Single    Spouse name: Not on file  . Number of children: Not on file  . Years of education: Not on file  . Highest education level: Not on file  Occupational History  . Not on file  Social Needs  . Financial resource strain: Not on file  . Food insecurity:    Worry: Not on file    Inability: Not on file  . Transportation needs:    Medical: Not on file    Non-medical: Not on file  Tobacco Use  . Smoking status: Never Smoker  . Smokeless tobacco: Never Used  Substance and Sexual Activity  . Alcohol use: No  . Drug use: No  . Sexual activity: Not on file  Lifestyle  . Physical activity:    Days per week: Not on file    Minutes per session: Not on file  . Stress: Not on file  Relationships  . Social connections:    Talks on phone: Not on file    Gets together: Not on file    Attends religious service: Not on file    Active member of club or organization: Not on file    Attends meetings of clubs or organizations: Not on file    Relationship status: Not on file  .  Intimate partner violence:    Fear of current or ex partner: Not on file    Emotionally abused: Not on file    Physically abused: Not on file    Forced sexual activity: Not on file  Other Topics Concern  . Not on file  Social History Narrative  . Not on file    Physical Exam  Pulmonary/Chest: Effort normal. No respiratory distress. She has no wheezes. She has no rales.  Abdominal: She exhibits no distension.  Musculoskeletal: She exhibits edema.  Edema from feet to just below knees  Skin: She is not diaphoretic.        Future Appointments  Date Time Provider [place]  01/03/2018  3:00 PM [facility] PA/NP [facility] None  01/10/2018  9:00 AM [facility] HS  VASC 1 - HC [facility]  01/10/2018  9:30 AM [facility] HS VASC 1 - HC [facility]  01/10/2018 10:30 AM Serafina Mitchell, MD [facility]  03/01/2018  1:40 PM Minus Breeding, MD [facility]    ATF pt CAO x4 sitting in her recliner with a comforter draped over her c/o feeling cold all day.  Pt denies fever, she stated that she is anemic and has started back taking iron.  Pt is taking 100% iron over the counter; that's what she was given in the hospital according to her boyfriend.  Pt is still very upset from the news that she received Friday afternoon.  She stated that she has been reserching on YouTube and Google about her disease/condition.  I was able to pick up the information package from the kidney specialist today and brought the info to her to read over.  I advised her to read the information given and limit researching on the internet. I also advised to write down any questions and ask Mrs. Queen during her next visit or call her.  Pt has no other complaints this time.  I told pt that she should get out of the house for a few mins.  She has taken all of her meds without missing them.  I encouraged her to continue to eat foods low in sodium and watch what she's eating. rx bottles verified. Pt is no longer using the pill box.    BP (!) 148/90 (BP Location: Left Arm, Patient Position: Sitting, Cuff Size: Large)   Pulse 77   Resp 16   Wt 205 lb (93 kg)   SpO2 98%   BMI 44.36 kg/m   Weight yesterday-205 Last visit weight-206    Kaleab Frasier, EMT Paramedic 01/01/2018    ACTION: Home visit completed

## 2018-01-01 NOTE — Telephone Encounter (Signed)
Dee with paramedicine called back and she stated that patient does have increased lower extremity edema.  No other symptoms reported.  Continues to wear her compression stockings.  Recently found out that she will have to start dialysis and will go for her initial appointment next Thursday.  She currently is taking lasix 60 BID.  Will forward to Tonye Becket, NP to review.

## 2018-01-03 ENCOUNTER — Encounter (HOSPITAL_COMMUNITY): Payer: Self-pay

## 2018-01-03 ENCOUNTER — Ambulatory Visit (HOSPITAL_COMMUNITY)
Admission: RE | Admit: 2018-01-03 | Discharge: 2018-01-03 | Disposition: A | Discharge status: Home / Self Care | Payer: Medicaid Other | Source: Ambulatory Visit | Attending: Internal Medicine | Admitting: Internal Medicine

## 2018-01-03 VITALS — BP 132/84 | HR 78 | Wt 209.0 lb

## 2018-01-03 DIAGNOSIS — Z794 Long term (current) use of insulin: Secondary | ICD-10-CM | POA: Insufficient documentation

## 2018-01-03 DIAGNOSIS — N184 Chronic kidney disease, stage 4 (severe): Secondary | ICD-10-CM | POA: Diagnosis not present

## 2018-01-03 DIAGNOSIS — I10 Essential (primary) hypertension: Secondary | ICD-10-CM

## 2018-01-03 DIAGNOSIS — I5023 Acute on chronic systolic (congestive) heart failure: Secondary | ICD-10-CM | POA: Insufficient documentation

## 2018-01-03 DIAGNOSIS — E1122 Type 2 diabetes mellitus with diabetic chronic kidney disease: Secondary | ICD-10-CM | POA: Diagnosis not present

## 2018-01-03 DIAGNOSIS — I130 Hypertensive heart and chronic kidney disease with heart failure and stage 1 through stage 4 chronic kidney disease, or unspecified chronic kidney disease: Secondary | ICD-10-CM | POA: Insufficient documentation

## 2018-01-03 DIAGNOSIS — I5022 Chronic systolic (congestive) heart failure: Secondary | ICD-10-CM | POA: Diagnosis not present

## 2018-01-03 DIAGNOSIS — N183 Chronic kidney disease, stage 3 unspecified: Secondary | ICD-10-CM

## 2018-01-03 DIAGNOSIS — Z79899 Other long term (current) drug therapy: Secondary | ICD-10-CM | POA: Diagnosis not present

## 2018-01-03 NOTE — Progress Notes (Signed)
Primary HF Cardiologist: Dr Aundra Dubin  Primary Physician: Lin Landsman, MD Cardiologist:  Dr Percival Spanish  HPI: Lisa Hodges is a 32 year old with a history of DMI, HTN, CKD Stage III, and chronic systolic heart failure.   Diagnosed with heart failure 10/2016 during hospital admit. She had been treated for pneumonia prior to admit. Diuresed with IV lasix and transitioned to lasix 40 mg po daily, hydralazine 10 mg tid, and imdur 15 mg daily. Discharge creatinine 1.89.   Last seen by Dr Percival Spanish in May 2018. At that time lasix was increased to 60 mg daily. Weight was 191 pounds.   Admitted 3/4 with volume overload. She had been out of insulin for over 1 month and taking medications intermittently. Diuresed with IV lasix and transitioned lasix 60 mg twice a day. Creatinine on admit was 4.9 so she was referred to nephrology and has follow up April 4th.   Today she returns for HF follow up. Complaining of fatigue. SOB with steps. Denies PND/Orthopnea. Appetite ok. No fever or chills. Weight at home 203-205 pounds. Just increased lasix to 80 mg twice a day on 4/10. . Taking all medications. Requires transportation assistance.  Lives with her blind boyfriend and disabled daughter. Daughter has CP and is confined to a wheel chair. Transported to appointment by medicaid. Takes the bus to get groceries. Followed by Paramedicine.      11/26/2017 Echo  Left ventricle: The cavity size was normal. Systolic function was moderately reduced. The estimated ejection fraction was in the range of 35% to 40%. Moderate diffuse hypokinesis with regional variations. Features are consistent with a pseudonormal left ventricular filling pattern, with concomitant abnormal relaxation and increased filling pressure (grade 2 diastolic dysfunction). Doppler parameters are consistent with high ventricular filling pressure. - Aortic valve: There was trivial regurgitation. - Mitral valve: There was mild  regurgitation. - Pulmonary arteries: PA peak pressure: 38 mm Hg (S). - Pericardium, extracardiac: A small to moderate, free-flowing pericardial effusion was identified circumferential to the heart. The fluid had no internal echoes.  10/2016  - Left ventricle: The cavity size was normal. Wall thickness was increased in a pattern of mild LVH. Systolic function was moderately to severely reduced. The estimated ejection fraction was in the range of 30% to 35%. Diffuse hypokinesis. - Aortic valve: There was mild regurgitation. - Mitral valve: There was mild regurgitation. - Pericardium, extracardiac: A small pericardial effusion was identified.  Lexi Scan 2018  Low risk study   ROS: All systems negative except as listed in HPI, PMH and Problem List.  SH:  Social History   Socioeconomic History  . Marital status: Single    Spouse name: Not on file  . Number of children: Not on file  . Years of education: Not on file  . Highest education level: Not on file  Occupational History  . Not on file  Social Needs  . Financial resource strain: Not on file  . Food insecurity:    Worry: Not on file    Inability: Not on file  . Transportation needs:    Medical: Not on file    Non-medical: Not on file  Tobacco Use  . Smoking status: Never Smoker  . Smokeless tobacco: Never Used  Substance and Sexual Activity  . Alcohol use: No  . Drug use: No  . Sexual activity: Not on file  Lifestyle  . Physical activity:    Days per week: Not on file    Minutes per session: Not on file  .  Stress: Not on file  Relationships  . Social connections:    Talks on phone: Not on file    Gets together: Not on file    Attends religious service: Not on file    Active member of club or organization: Not on file    Attends meetings of clubs or organizations: Not on file    Relationship status: Not on file  . Intimate partner violence:    Fear of current or ex partner: Not on file     Emotionally abused: Not on file    Physically abused: Not on file    Forced sexual activity: Not on file  Other Topics Concern  . Not on file  Social History Narrative  . Not on file    FH:  Family History  Problem Relation Age of Onset  . Diabetes Mother   . Kidney failure Mother     Past Medical History:  Diagnosis Date  . CKD (chronic kidney disease)   . Diabetes mellitus without complication ([place])   . Hypertension   . Systolic CHF [facility])     Current Outpatient Medications  Medication Sig Dispense Refill  . ACCU-CHEK SOFTCLIX LANCETS lancets Use as instructed 100 each 12  . Blood Glucose Monitoring Suppl (ACCU-CHEK AVIVA PLUS) w/Device KIT Use as directed 1 kit 0  . carvedilol (COREG) 12.5 MG tablet Take 1 tablet (12.5 mg total) by mouth 2 (two) times daily with a meal. 60 tablet 0  . furosemide (LASIX) 40 MG tablet Take 2 tablets (80 mg total) by mouth 2 (two) times daily. 120 tablet 3  . glucose blood (ACCU-CHEK AVIVA PLUS) test strip Use as instructed 100 each 12  . insulin aspart protamine- aspart (NOVOLOG MIX 70/30) (70-30) 100 UNIT/ML injection Inject 0.08 mLs (8 Units total) into the skin 2 (two) times daily with a meal. 4.8 mL 0  . Insulin Syringe-Needle U-100 (INSULIN SYRINGE .5CC/31GX5/16") 31G X 5/16" 0.5 ML MISC USE AS DIRECTED. 100 each 0  . isosorbide-hydrALAZINE (BIDIL) 20-37.5 MG tablet Take 2 tablets by mouth 3 (three) times daily. 180 tablet 11  . Lancets Misc. (ACCU-CHEK SOFTCLIX LANCET DEV) KIT Use as directed 1 kit 0  . sevelamer carbonate (RENVELA) 800 MG tablet Take 1 tablet (800 mg total) by mouth 3 (three) times daily with meals. 90 tablet 0   No current facility-administered medications for this encounter.     Vitals:   01/03/18 1522  BP: 132/84  Pulse: 78  SpO2: 100%  Weight: 209 lb (94.8 kg)   Filed Weights   01/03/18 1522  Weight: 209 lb (94.8 kg)   Filed Weights   01/03/18 1522  Weight: 209 lb (94.8 kg)   PHYSICAL EXAM: General:   . No resp difficulty HEENT: normal Neck: supple. JVP 10-11. Carotids 2+ bilat; no bruits. No lymphadenopathy or thryomegaly appreciated. Cor: PMI nondisplaced. Regular rate & rhythm. No rubs, gallops or murmurs. Lungs: clear Abdomen: soft, nontender, nondistended. No hepatosplenomegaly. No bruits or masses. Good bowel sounds. Extremities: no cyanosis, clubbing, rash, R and LLE 2+edema Neuro: alert & orientedx3, cranial nerves grossly intact. moves all 4 extremities w/o difficulty. Affect pleasant   ASSESSMENT & PLAN: 1. Acute on chronic systolic CHF: Cardiomyopathy of uncertain etiology. Echo 3/19 with EF 35-40%, moderately dilated LV.  Has RFs for CAD with type II diabetes with onset at age 60 and HTN. HIV negative. However, never had cath due to elevated creatinine. Also could be related to prior viral myocarditis or even long-standing  diabetes or HTN. SPEP negative.Admission complicated by ?AKI with creatinine up to 5 on presentation.  NYHA  IIIb. Volume status mildly elevated. Lasix was just increased to 80 mg twice a day.  - Continue carvedilol 12. 59m twice a day.  - continue bidil 2 tabs three times a day.   2. CKD Stage IV.  Renal [place]. Suspect underlying diabetic nephropathy.  -Followed by nephrology. Planning to start HD soon. Has follow up with [facility] next week.   3. HTN: - Improved.    .  4. DM: Started as type II on po meds at age 89  Surprisingly, hgbA1c was not particularly high.  Heavy proteinuria, likely diabetic nephropathy.  Following closely with PCP  5. Anemia: Fe deficient, may be due to heavy menses.  - she got feraheme.  Follow up in 6 weeks. Continue HF paramedicine.   ADarrick GrinderNP- C 3:23 PM

## 2018-01-03 NOTE — Patient Instructions (Signed)
No changes to medication at this time.  No lab work.  Follow up 6 weeks.  ____________________________________________________________ [place] Code: 1200  Take all medication as prescribed the day of your appointment. Bring all medications with you to your appointment.  Do the following things EVERYDAY: 1) Weigh yourself in the morning before breakfast. Write it down and keep it in a log. 2) Take your medicines as prescribed 3) Eat low salt foods-Limit salt (sodium) to 2000 mg per day.  4) Stay as active as you can everyday 5) Limit all fluids for the day to less than 2 liters

## 2018-01-04 ENCOUNTER — Encounter: Payer: Medicaid Other | Admitting: Vascular Surgery

## 2018-01-10 ENCOUNTER — Ambulatory Visit (INDEPENDENT_AMBULATORY_CARE_PROVIDER_SITE_OTHER): Payer: Medicaid Other | Admitting: Surgery

## 2018-01-10 ENCOUNTER — Ambulatory Visit (INDEPENDENT_AMBULATORY_CARE_PROVIDER_SITE_OTHER)
Admission: RE | Admit: 2018-01-10 | Discharge: 2018-01-10 | Disposition: A | Discharge status: Home / Self Care | Payer: Medicaid Other | Source: Ambulatory Visit | Attending: Surgery | Admitting: Surgery

## 2018-01-10 ENCOUNTER — Encounter: Payer: Self-pay | Admitting: *Deleted

## 2018-01-10 ENCOUNTER — Ambulatory Visit (HOSPITAL_COMMUNITY)
Admission: RE | Admit: 2018-01-10 | Discharge: 2018-01-10 | Disposition: A | Discharge status: Home / Self Care | Payer: Medicaid Other | Source: Ambulatory Visit | Attending: Surgery | Admitting: Surgery

## 2018-01-10 ENCOUNTER — Other Ambulatory Visit: Payer: Self-pay | Admitting: *Deleted

## 2018-01-10 ENCOUNTER — Encounter: Payer: Self-pay | Admitting: Surgery

## 2018-01-10 ENCOUNTER — Other Ambulatory Visit: Payer: Self-pay

## 2018-01-10 VITALS — BP 143/93 | HR 78 | Temp 98.2°F | Resp 16 | Ht <= 58 in | Wt 204.0 lb

## 2018-01-10 DIAGNOSIS — Z4931 Encounter for adequacy testing for hemodialysis: Secondary | ICD-10-CM | POA: Diagnosis present

## 2018-01-10 DIAGNOSIS — N185 Chronic kidney disease, stage 5: Secondary | ICD-10-CM

## 2018-01-10 NOTE — H&P (View-Only) (Signed)
 [facility] [place]  Patient name: Lisa Hodges MRN: [id number] DOB: 06/24/1986 Sex: female   REQUESTING PROVIDER:    Dr. Dunham   REASON FOR CONSULT:    Renal failure  HISTORY OF PRESENT ILLNESS:   Lisa Hodges is a 32 y.o. female, who  was admitted to [facility] on 11/28/2017 with a CHF exacerbation and a creatinine around 5.  At that time it was unclear if this was an acute exacerbation or disease progression.  The patient has a history of diabetes and hypertension.  She has not had any improvement of her renal function and is here today for dialysis discussions.  She is left-handed.  PAST MEDICAL HISTORY    Past Medical History:  Diagnosis Date  . CKD (chronic kidney disease)   . Diabetes mellitus without complication ([facility])   . Hypertension   . Systolic CHF ([facility])      FAMILY HISTORY   Family History  Problem Relation Age of Onset  . Diabetes Mother   . Kidney failure Mother     SOCIAL HISTORY:   Social History   Socioeconomic History  . Marital status: Single    Spouse name: Not on file  . Number of children: Not on file  . Years of education: Not on file  . Highest education level: Not on file  Occupational History  . Not on file  Social Needs  . Financial resource strain: Not on file  . Food insecurity:    Worry: Not on file    Inability: Not on file  . Transportation needs:    Medical: Not on file    Non-medical: Not on file  Tobacco Use  . Smoking status: Never Smoker  . Smokeless tobacco: Never Used  Substance and Sexual Activity  . Alcohol use: No  . Drug use: No  . Sexual activity: Not on file  Lifestyle  . Physical activity:    Days per week: Not on file    Minutes per session: Not on file  . Stress: Not on file  Relationships  . Social connections:    Talks on phone: Not on file    Gets together: Not on file    Attends religious service: Not on file    Active member  of club or organization: Not on file    Attends meetings of clubs or organizations: Not on file    Relationship status: Not on file  . Intimate partner violence:    Fear of current or ex partner: Not on file    Emotionally abused: Not on file    Physically abused: Not on file    Forced sexual activity: Not on file  Other Topics Concern  . Not on file  Social History Narrative  . Not on file    ALLERGIES:    No Known Allergies  CURRENT MEDICATIONS:    Current Outpatient Medications  Medication Sig Dispense Refill  . ACCU-CHEK SOFTCLIX LANCETS lancets Use as instructed 100 each 12  . Blood Glucose Monitoring Suppl (ACCU-CHEK AVIVA PLUS) w/Device KIT Use as directed 1 kit 0  . carvedilol (COREG) 12.5 MG tablet Take 1 tablet (12.5 mg total) by mouth 2 (two) times daily with a meal. 60 tablet 0  . furosemide (LASIX) 40 MG tablet Take 2 tablets (80 mg total) by mouth 2 (two) times daily. 120 tablet 3  . glucose blood (ACCU-CHEK AVIVA PLUS) test strip Use as instructed 100 each 12  . Insulin Syringe-Needle U-100 (  INSULIN SYRINGE 1CC/31GX5/16") 31G X 5/16" 1 ML MISC USE UTD  0  . isosorbide-hydrALAZINE (BIDIL) 20-37.5 MG tablet Take 2 tablets by mouth 3 (three) times daily. 180 tablet 11  . Lancets Misc. (ACCU-CHEK SOFTCLIX LANCET DEV) KIT Use as directed 1 kit 0  . insulin aspart protamine- aspart (NOVOLOG MIX 70/30) (70-30) 100 UNIT/ML injection Inject 0.08 mLs (8 Units total) into the skin 2 (two) times daily with a meal. 4.8 mL 0  . Insulin Syringe-Needle U-100 (INSULIN SYRINGE .5CC/31GX5/16") 31G X 5/16" 0.5 ML MISC USE AS DIRECTED. 100 each 0  . RENVELA 0.8 g PACK packet USE 1 PACKET  PO TID UTD  WC  0  . sevelamer carbonate (RENVELA) 800 MG tablet Take 1 tablet (800 mg total) by mouth 3 (three) times daily with meals. (Patient not taking: Reported on 01/10/2018) 90 tablet 0   No current facility-administered medications for this visit.     REVIEW OF SYSTEMS:   [X] denotes  positive finding, [ ] denotes negative finding Cardiac  Comments:  Chest pain or chest pressure:    Shortness of breath upon exertion:    Short of breath when lying flat:    Irregular heart rhythm:        Vascular    Pain in calf, thigh, or hip brought on by ambulation:    Pain in feet at night that wakes you up from your sleep:     Blood clot in your veins:    Leg swelling:  x       Pulmonary    Oxygen at home:    Productive cough:     Wheezing:         Neurologic    Sudden weakness in arms or legs:     Sudden numbness in arms or legs:     Sudden onset of difficulty speaking or slurred speech:    Temporary loss of vision in one eye:     Problems with dizziness:         Gastrointestinal    Blood in stool:      Vomited blood:         Genitourinary    Burning when urinating:     Blood in urine:        Psychiatric    Major depression:         Hematologic    Bleeding problems:    Problems with blood clotting too easily:        Skin    Rashes or ulcers:        Constitutional    Fever or chills:     PHYSICAL EXAM:   Vitals:   01/10/18 0949 01/10/18 0955  BP: (!) 143/86 (!) 143/93  Pulse: 78 78  Resp: 16   Temp: 98.2 F (36.8 C)   TempSrc: Oral   SpO2: 99%   Weight: 204 lb (92.5 kg)   Height: 4' 9" (1.448 m)     GENERAL: The patient is a well-nourished female, in no acute distress. The vital signs are documented above. CARDIAC: There is a regular rate and rhythm.  VASCULAR: Palpable radial brachial pulse bilateral PULMONARY: Nonlabored respirations I have ordered and reviewed her vascular lab studies with the following findings  Arterial duplex: Patent brachial radial and ulnar arteries bilaterally with triphasic waveforms  MUSCULOSKELETAL: There are no major deformities or cyanosis. NEUROLOGIC: No focal weakness or paresthesias are detected. SKIN: There are no ulcers or rashes noted. PSYCHIATRIC: The patient has a   normal affect.  STUDIES:   Vein  mapping: Marginal bilateral cephalic vein  ASSESSMENT and PLAN   Stage V renal insufficiency: A permanent access and tunneled catheter had been requested.  The patient is left-handed and therefore a right arm access will be pursued.  I discussed that we would evaluate her cephalic and basilic vein in the operating room to see if she could be a candidate for fistula.  However based on her vein mapping, there is a high chance that she may require a Gore-Tex graft.  She understands that a fistula or a graft could be inserted and additional procedures may be performed if a fistula is needed in order to get it to mature.  She will also need a tunneled catheter.  I am unavailable next week and therefore I discussed that 1 of my partners would be performing the procedure.  She is in understanding and agreement with that.   Annamarie Major, MD [facility] [phone] Pager [phone]

## 2018-01-10 NOTE — Progress Notes (Signed)
Vascular and Vein Specialist of [facility]  Patient name: Lisa Hodges MRN: [id number] DOB: [birth year] Sex: female   REQUESTING PROVIDER:    Dr. Lorrene Reid   REASON FOR CONSULT:    Renal failure  HISTORY OF PRESENT ILLNESS:   Lisa Hodges is a 32 y.o. female, who  was admitted to [facility] on 11/28/2017 with a CHF exacerbation and a creatinine around 5.  At that time it was unclear if this was an acute exacerbation or disease progression.  The patient has a history of diabetes and hypertension.  She has not had any improvement of her renal function and is here today for dialysis discussions.  She is left-handed.  PAST MEDICAL HISTORY    Past Medical History:  Diagnosis Date  . CKD (chronic kidney disease)   . Diabetes mellitus without complication ([place])   . Hypertension   . Systolic CHF ([place])      FAMILY HISTORY   Family History  Problem Relation Age of Onset  . Diabetes Mother   . Kidney failure Mother     SOCIAL HISTORY:   Social History   Socioeconomic History  . Marital status: Single    Spouse name: Not on file  . Number of children: Not on file  . Years of education: Not on file  . Highest education level: Not on file  Occupational History  . Not on file  Social Needs  . Financial resource strain: Not on file  . Food insecurity:    Worry: Not on file    Inability: Not on file  . Transportation needs:    Medical: Not on file    Non-medical: Not on file  Tobacco Use  . Smoking status: Never Smoker  . Smokeless tobacco: Never Used  Substance and Sexual Activity  . Alcohol use: No  . Drug use: No  . Sexual activity: Not on file  Lifestyle  . Physical activity:    Days per week: Not on file    Minutes per session: Not on file  . Stress: Not on file  Relationships  . Social connections:    Talks on phone: Not on file    Gets together: Not on file    Attends religious service: Not on file    Active member  of club or organization: Not on file    Attends meetings of clubs or organizations: Not on file    Relationship status: Not on file  . Intimate partner violence:    Fear of current or ex partner: Not on file    Emotionally abused: Not on file    Physically abused: Not on file    Forced sexual activity: Not on file  Other Topics Concern  . Not on file  Social History Narrative  . Not on file    ALLERGIES:    No Known Allergies  CURRENT MEDICATIONS:    Current Outpatient Medications  Medication Sig Dispense Refill  . ACCU-CHEK SOFTCLIX LANCETS lancets Use as instructed 100 each 12  . Blood Glucose Monitoring Suppl (ACCU-CHEK AVIVA PLUS) w/Device KIT Use as directed 1 kit 0  . carvedilol (COREG) 12.5 MG tablet Take 1 tablet (12.5 mg total) by mouth 2 (two) times daily with a meal. 60 tablet 0  . furosemide (LASIX) 40 MG tablet Take 2 tablets (80 mg total) by mouth 2 (two) times daily. 120 tablet 3  . glucose blood (ACCU-CHEK AVIVA PLUS) test strip Use as instructed 100 each 12  . Insulin Syringe-Needle U-100 (  INSULIN SYRINGE 1CC/31GX5/16") 31G X 5/16" 1 ML MISC USE UTD  0  . isosorbide-hydrALAZINE (BIDIL) 20-37.5 MG tablet Take 2 tablets by mouth 3 (three) times daily. 180 tablet 11  . Lancets Misc. (ACCU-CHEK SOFTCLIX LANCET DEV) KIT Use as directed 1 kit 0  . insulin aspart protamine- aspart (NOVOLOG MIX 70/30) (70-30) 100 UNIT/ML injection Inject 0.08 mLs (8 Units total) into the skin 2 (two) times daily with a meal. 4.8 mL 0  . Insulin Syringe-Needle U-100 (INSULIN SYRINGE .5CC/31GX5/16") 31G X 5/16" 0.5 ML MISC USE AS DIRECTED. 100 each 0  . RENVELA 0.8 g PACK packet USE 1 PACKET  PO TID UTD  WC  0  . sevelamer carbonate (RENVELA) 800 MG tablet Take 1 tablet (800 mg total) by mouth 3 (three) times daily with meals. (Patient not taking: Reported on 01/10/2018) 90 tablet 0   No current facility-administered medications for this visit.     REVIEW OF SYSTEMS:   [X] denotes  positive finding, [ ] denotes negative finding Cardiac  Comments:  Chest pain or chest pressure:    Shortness of breath upon exertion:    Short of breath when lying flat:    Irregular heart rhythm:        Vascular    Pain in calf, thigh, or hip brought on by ambulation:    Pain in feet at night that wakes you up from your sleep:     Blood clot in your veins:    Leg swelling:  x       Pulmonary    Oxygen at home:    Productive cough:     Wheezing:         Neurologic    Sudden weakness in arms or legs:     Sudden numbness in arms or legs:     Sudden onset of difficulty speaking or slurred speech:    Temporary loss of vision in one eye:     Problems with dizziness:         Gastrointestinal    Blood in stool:      Vomited blood:         Genitourinary    Burning when urinating:     Blood in urine:        Psychiatric    Major depression:         Hematologic    Bleeding problems:    Problems with blood clotting too easily:        Skin    Rashes or ulcers:        Constitutional    Fever or chills:     PHYSICAL EXAM:   Vitals:   01/10/18 0949 01/10/18 0955  BP: (!) 143/86 (!) 143/93  Pulse: 78 78  Resp: 16   Temp: 98.2 F (36.8 C)   TempSrc: Oral   SpO2: 99%   Weight: 204 lb (92.5 kg)   Height: 4' 9" (1.448 m)     GENERAL: The patient is a well-nourished female, in no acute distress. The vital signs are documented above. CARDIAC: There is a regular rate and rhythm.  VASCULAR: Palpable radial brachial pulse bilateral PULMONARY: Nonlabored respirations I have ordered and reviewed her vascular lab studies with the following findings  Arterial duplex: Patent brachial radial and ulnar arteries bilaterally with triphasic waveforms  MUSCULOSKELETAL: There are no major deformities or cyanosis. NEUROLOGIC: No focal weakness or paresthesias are detected. SKIN: There are no ulcers or rashes noted. PSYCHIATRIC: The patient has a  normal affect.  STUDIES:   Vein  mapping: Marginal bilateral cephalic vein  ASSESSMENT and PLAN   Stage V renal insufficiency: A permanent access and tunneled catheter had been requested.  The patient is left-handed and therefore a right arm access will be pursued.  I discussed that we would evaluate her cephalic and basilic vein in the operating room to see if she could be a candidate for fistula.  However based on her vein mapping, there is a high chance that she may require a Gore-Tex graft.  She understands that a fistula or a graft could be inserted and additional procedures may be performed if a fistula is needed in order to get it to mature.  She will also need a tunneled catheter.  I am unavailable next week and therefore I discussed that 1 of my partners would be performing the procedure.  She is in understanding and agreement with that.   Annamarie Major, MD Vascular and Vein Specialists of [facility] [phone] Pager [phone]

## 2018-01-11 ENCOUNTER — Other Ambulatory Visit (HOSPITAL_COMMUNITY): Payer: Self-pay

## 2018-01-11 ENCOUNTER — Encounter (HOSPITAL_COMMUNITY): Payer: Self-pay

## 2018-01-11 NOTE — Progress Notes (Signed)
Paramedicine Encounter    Patient ID: Lisa Hodges, female    DOB: 04/11/1986, 32 y.o.   MRN: [id number]    Patient Care Team: Lin Landsman, MD as PCP - General (Family Medicine)  Patient Active Problem List   Diagnosis Date Noted  . AKI (acute kidney injury) ([place])   . Acute on chronic renal failure ([place]) 11/26/2017  . Microcytic anemia 11/26/2017  . Hypertensive urgency 11/26/2017  . Medication management 01/29/2017  . Morbid obesity ([place]) 11/16/2016  . 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 Acute respiratory failure ([place]) 10/26/2016  . Acute on chronic systolic CHF (congestive heart failure) ([place]) 10/26/2016  . Chronic renal insufficiency, stage III (moderate) ([place]) 10/26/2016  . Hypertension 10/26/2016  . Insulin dependent diabetes mellitus with complications ([place]) 03/49/1791    Current Outpatient Medications:  .  carvedilol (COREG) 12.5 MG tablet, Take 1 tablet (12.5 mg total) by mouth 2 (two) times daily with a meal., Disp: 60 tablet, Rfl: 0 .  furosemide (LASIX) 40 MG tablet, Take 2 tablets (80 mg total) by mouth 2 (two) times daily., Disp: 120 tablet, Rfl: 3 .  isosorbide-hydrALAZINE (BIDIL) 20-37.5 MG tablet, Take 2 tablets by mouth 3 (three) times daily., Disp: 180 tablet, Rfl: 11 .  RENVELA 0.8 g PACK packet, Take 1 packet by mouth 3 times daily with meals, Disp: , Rfl: 0 .  ACCU-CHEK SOFTCLIX LANCETS lancets, Use as instructed (Patient not taking: Reported on 01/10/2018), Disp: 100 each, Rfl: 12 .  Blood Glucose Monitoring Suppl (ACCU-CHEK AVIVA PLUS) w/Device KIT, Use as directed (Patient not taking: Reported on 01/10/2018), Disp: 1 kit, Rfl: 0 .  glucose blood (ACCU-CHEK AVIVA PLUS) test strip, Use as instructed (Patient not taking: Reported on 01/10/2018), Disp: 100 each, Rfl: 12 .  insulin aspart protamine- aspart (NOVOLOG MIX 70/30) (70-30) 100 UNIT/ML injection, Inject 0.08 mLs (8 Units total) into the skin 2 (two) times daily with a meal.,  Disp: 4.8 mL, Rfl: 0 .  Insulin Syringe-Needle U-100 (INSULIN SYRINGE .5CC/31GX5/16") 31G X 5/16" 0.5 ML MISC, USE AS DIRECTED. (Patient not taking: Reported on 01/10/2018), Disp: 100 each, Rfl: 0 .  IRON PO, Take 1 tablet by mouth daily., Disp: , Rfl:  .  Lancets Misc. (ACCU-CHEK SOFTCLIX LANCET DEV) KIT, Use as directed (Patient not taking: Reported on 01/10/2018), Disp: 1 kit, Rfl: 0 No Known Allergies   Social History   Socioeconomic History  . Marital status: Single    Spouse name: Not on file  . Number of children: Not on file  . Years of education: Not on file  . Highest education level: Not on file  Occupational History  . Not on file  Social Needs  . Financial resource strain: Not on file  . Food insecurity:    Worry: Not on file    Inability: Not on file  . Transportation needs:    Medical: Not on file    Non-medical: Not on file  Tobacco Use  . Smoking status: Never Smoker  . Smokeless tobacco: Never Used  Substance and Sexual Activity  . Alcohol use: No  . Drug use: No  . Sexual activity: Not on file  Lifestyle  . Physical activity:    Days per week: Not on file    Minutes per session: Not on file  . Stress: Not on file  Relationships  . Social connections:    Talks on phone: Not on file    Gets together: Not on file  Attends religious service: Not on file    Active member of club or organization: Not on file    Attends meetings of clubs or organizations: Not on file    Relationship status: Not on file  . Intimate partner violence:    Fear of current or ex partner: Not on file    Emotionally abused: Not on file    Physically abused: Not on file    Forced sexual activity: Not on file  Other Topics Concern  . Not on file  Social History Narrative  . Not on file    Physical Exam      Future Appointments  Date Time Provider [place]  02/13/2018  3:00 PM [facility] PA/NP [facility] None  03/01/2018  1:40 PM Minus Breeding, MD [facility]    ATF pt CAO x4 sitting in the recliner c/o feeling really cold.  She stated that she feels a lot better this week but she's concerend because she's been out of the medications for her kidneys.  She stated that she had her consult with the kidney physician about getting the shunt placed; she feels better about the procedure.  She will need about 3 months to heal before she will start going to diaylsis.  Pt denies sob, dizziness and chest pain. She also denies issues urinating. Her legs are still swollen but she hasn't been elevating them.  Her bf stated that she refuses to walk around for excerise and he's concern. Pt reports taking her medications as prescribed.  rx bottles verified.   BP (!) 150/90 (BP Location: Right Arm, Patient Position: Sitting, Cuff Size: Normal)   Pulse 84   Resp 16   Wt 199 lb (90.3 kg)   SpO2 99%   BMI 43.06 kg/m   Weight yesterday-201 Last visit weight-205  **rx called in: Furosemide carvediol reneav  Angles Trevizo, EMT Paramedic 01/15/2018    ACTION: Home visit completed

## 2018-01-14 ENCOUNTER — Encounter (HOSPITAL_COMMUNITY): Payer: Self-pay | Admitting: *Deleted

## 2018-01-14 ENCOUNTER — Other Ambulatory Visit: Payer: Self-pay

## 2018-01-14 NOTE — Progress Notes (Signed)
Lisa Hodges reports that her CBGs run 180- 190, I encouraged patient to check CBG more frequently and see if anything specific is causing it to be that high.  Informed her that the goal is under 180 for healing and decreasing chances of infection. I instructed patient to check CBG after awaking and every 2 hours until arrival  to the hospital.  I Instructed patient if CBG is less than 70 to drink 1/2 cup of a clear juice. Recheck CBG in 15 minutes then call pre- op desk at [phone] for further instructions. If scheduled to receive Insulin, do not take Insulin.  Patient states that her Coreg bottle states to take with a meal, so she is afraid to take it on an empty stomach, afraid she will get sick.   I told patient to make sure she takes Bidil.

## 2018-01-15 ENCOUNTER — Telehealth (HOSPITAL_COMMUNITY): Payer: Self-pay

## 2018-01-15 NOTE — Anesthesia Preprocedure Evaluation (Addendum)
Anesthesia Evaluation  Patient identified by MRN, date of birth, ID band Patient awake    Reviewed: Allergy & Precautions, NPO status , Patient's Chart, lab work & pertinent test results, reviewed documented beta blocker date and time   Airway Mallampati: III  TM Distance: >3 FB Neck ROM: Full    Dental  (+) Dental Advisory Given, Poor Dentition, Chipped,    Pulmonary neg pulmonary ROS,    breath sounds clear to auscultation       Cardiovascular hypertension, Pt. on home beta blockers and Pt. on medications +CHF   Rhythm:Regular Rate:Normal  '19 TTE - EF 35% to 40%. Moderate diffuse hypokinesis with regional  variations. Grade 2 diastolic dysfunction.Trivial AI, mild MR. PASP 30mHg. A small to moderate, free-flowing pericardial effusion was identified circumferential to the heart. The fluid had no internal echoes.  '18 Perfusion Scan - EF moderately decreased (30-44%).  Nuclear stress EF: 40%. There was no ST segment deviation noted during stress. No T wave inversion was noted during stress. This is a low risk study.   Neuro/Psych Depression negative neurological ROS     GI/Hepatic negative GI ROS, Neg liver ROS,   Endo/Other  diabetes, Insulin DependentMorbid obesity  Renal/GU ESRFRenal disease  negative genitourinary   Musculoskeletal negative musculoskeletal ROS (+)   Abdominal (+) + obese,   Peds  Hematology negative hematology ROS (+)   Anesthesia Other Findings   Reproductive/Obstetrics                            Anesthesia Physical Anesthesia Plan  ASA: IV  Anesthesia Plan: MAC   Post-op Pain Management:    Induction: Intravenous  PONV Risk Score and Plan: Propofol infusion and Treatment may vary due to age or medical condition  Airway Management Planned: Nasal Cannula and Natural Airway  Additional Equipment: None  Intra-op Plan:   Post-operative Plan:    Informed Consent: I have reviewed the patients History and Physical, chart, labs and discussed the procedure including the risks, benefits and alternatives for the proposed anesthesia with the patient or authorized representative who has indicated his/her understanding and acceptance.     Plan Discussed with: CRNA and Anesthesiologist  Anesthesia Plan Comments:         Anesthesia Quick Evaluation

## 2018-01-15 NOTE — Telephone Encounter (Signed)
Pt called earlier because she hadn't heard from the pharmacy concerning her medication refills.  I called [facility] pharmacy and the pharmacist stated that they haven't received a fax back from the prescribing physician.  Pt is worried because she has been out for over a week.  I called the physician at the kidney specialist that pt see's.  I spoke with [facility] from Dr. Eliott Nine office, she stated that she will call the refill in to the [facility] pharmacy on [address].  I called pt back to f/u with her, I will pick up her meds Thursday if she can't pick them up after her procedure tomorrow.

## 2018-01-16 ENCOUNTER — Other Ambulatory Visit: Payer: Self-pay

## 2018-01-16 ENCOUNTER — Encounter (HOSPITAL_COMMUNITY)
Admission: RE | Disposition: A | Discharge status: Home / Self Care | Payer: Self-pay | Source: Ambulatory Visit | Attending: Vascular Surgery

## 2018-01-16 ENCOUNTER — Ambulatory Visit (HOSPITAL_COMMUNITY): Payer: Medicaid Other | Admitting: Anesthesiology

## 2018-01-16 ENCOUNTER — Observation Stay (HOSPITAL_COMMUNITY): Payer: Medicaid Other

## 2018-01-16 ENCOUNTER — Observation Stay (HOSPITAL_COMMUNITY)
Admission: RE | Admit: 2018-01-16 | Discharge: 2018-01-17 | Disposition: A | Discharge status: Home / Self Care | Payer: Medicaid Other | Source: Ambulatory Visit | Attending: Vascular Surgery | Admitting: Vascular Surgery

## 2018-01-16 ENCOUNTER — Encounter (HOSPITAL_COMMUNITY): Payer: Self-pay | Admitting: *Deleted

## 2018-01-16 ENCOUNTER — Ambulatory Visit (HOSPITAL_COMMUNITY): Payer: Medicaid Other

## 2018-01-16 DIAGNOSIS — I132 Hypertensive heart and chronic kidney disease with heart failure and with stage 5 chronic kidney disease, or end stage renal disease: Secondary | ICD-10-CM | POA: Diagnosis not present

## 2018-01-16 DIAGNOSIS — Z6841 Body Mass Index (BMI) 40.0 and over, adult: Secondary | ICD-10-CM | POA: Insufficient documentation

## 2018-01-16 DIAGNOSIS — N189 Chronic kidney disease, unspecified: Secondary | ICD-10-CM | POA: Diagnosis present

## 2018-01-16 DIAGNOSIS — I5022 Chronic systolic (congestive) heart failure: Secondary | ICD-10-CM | POA: Insufficient documentation

## 2018-01-16 DIAGNOSIS — Z992 Dependence on renal dialysis: Secondary | ICD-10-CM

## 2018-01-16 DIAGNOSIS — Z23 Encounter for immunization: Secondary | ICD-10-CM | POA: Diagnosis not present

## 2018-01-16 DIAGNOSIS — Z794 Long term (current) use of insulin: Secondary | ICD-10-CM | POA: Diagnosis not present

## 2018-01-16 DIAGNOSIS — N185 Chronic kidney disease, stage 5: Secondary | ICD-10-CM | POA: Diagnosis not present

## 2018-01-16 DIAGNOSIS — Z79899 Other long term (current) drug therapy: Secondary | ICD-10-CM | POA: Diagnosis not present

## 2018-01-16 DIAGNOSIS — N186 End stage renal disease: Secondary | ICD-10-CM | POA: Diagnosis not present

## 2018-01-16 DIAGNOSIS — E1122 Type 2 diabetes mellitus with diabetic chronic kidney disease: Secondary | ICD-10-CM | POA: Diagnosis not present

## 2018-01-16 DIAGNOSIS — Z419 Encounter for procedure for purposes other than remedying health state, unspecified: Secondary | ICD-10-CM

## 2018-01-16 HISTORY — DX: Dyspnea, unspecified: R06.00

## 2018-01-16 HISTORY — DX: Chronic kidney disease, stage 5: N18.5

## 2018-01-16 HISTORY — PX: AV FISTULA PLACEMENT: SHX1204

## 2018-01-16 HISTORY — DX: Nausea with vomiting, unspecified: R11.2

## 2018-01-16 HISTORY — PX: INSERTION OF DIALYSIS CATHETER: SHX1324

## 2018-01-16 HISTORY — DX: Migraine, unspecified, not intractable, without status migrainosus: G43.909

## 2018-01-16 HISTORY — DX: Nausea with vomiting, unspecified: Z98.890

## 2018-01-16 HISTORY — DX: Major depressive disorder, single episode, unspecified: F32.9

## 2018-01-16 HISTORY — DX: Depression, unspecified: F32.A

## 2018-01-16 HISTORY — DX: Type 2 diabetes mellitus without complications: E11.9

## 2018-01-16 LAB — POCT I-STAT 4, (NA,K, GLUC, HGB,HCT)
GLUCOSE: 89 mg/dL (ref 65–99)
HCT: 31 % — ABNORMAL LOW (ref 36.0–46.0)
Hemoglobin: 10.5 g/dL — ABNORMAL LOW (ref 12.0–15.0)
Potassium: 4.2 mmol/L (ref 3.5–5.1)
Sodium: 141 mmol/L (ref 135–145)

## 2018-01-16 LAB — GLUCOSE, CAPILLARY
GLUCOSE-CAPILLARY: 101 mg/dL — AB (ref 65–99)
GLUCOSE-CAPILLARY: 81 mg/dL (ref 65–99)
GLUCOSE-CAPILLARY: 142 mg/dL — AB (ref 65–99)
Glucose-Capillary: 80 mg/dL (ref 65–99)
Glucose-Capillary: 120 mg/dL — ABNORMAL HIGH (ref 65–99)

## 2018-01-16 LAB — HCG, SERUM, QUALITATIVE: Preg, Serum: NEGATIVE

## 2018-01-16 SURGERY — ARTERIOVENOUS (AV) FISTULA CREATION
Anesthesia: Monitor Anesthesia Care | Site: Neck | Laterality: Right

## 2018-01-16 MED ORDER — FENTANYL CITRATE (PF) 100 MCG/2ML IJ SOLN
INTRAMUSCULAR | Status: DC | PRN
  Administered 2018-01-16 (×3): 25 ug via INTRAVENOUS

## 2018-01-16 MED ORDER — OXYCODONE HCL 5 MG PO TABS
ORAL_TABLET | ORAL | Status: AC
  Filled 2018-01-16: qty 1

## 2018-01-16 MED ORDER — CARVEDILOL 12.5 MG PO TABS
12.5000 mg | ORAL_TABLET | Freq: Two times a day (BID) | ORAL | Status: DC
  Administered 2018-01-16 – 2018-01-17 (×2): 12.5 mg via ORAL
  Filled 2018-01-16 (×3): qty 1

## 2018-01-16 MED ORDER — ACETAMINOPHEN 325 MG PO TABS: 325.0000 mg | ORAL_TABLET | ORAL | Status: DC | PRN

## 2018-01-16 MED ORDER — ISOSORB DINITRATE-HYDRALAZINE 20-37.5 MG PO TABS
2.0000 | ORAL_TABLET | Freq: Three times a day (TID) | ORAL | Status: DC
  Administered 2018-01-16 – 2018-01-17 (×2): 2 via ORAL
  Filled 2018-01-16 (×3): qty 2

## 2018-01-16 MED ORDER — OXYCODONE-ACETAMINOPHEN 5-325 MG PO TABS: 1.0000 | ORAL_TABLET | ORAL | Status: DC | PRN

## 2018-01-16 MED ORDER — FENTANYL CITRATE (PF) 250 MCG/5ML IJ SOLN
INTRAMUSCULAR | Status: AC
  Filled 2018-01-16: qty 5

## 2018-01-16 MED ORDER — LIDOCAINE-EPINEPHRINE 0.5 %-1:200000 IJ SOLN
INTRAMUSCULAR | Status: AC
  Filled 2018-01-16: qty 1

## 2018-01-16 MED ORDER — MORPHINE SULFATE (PF) 2 MG/ML IV SOLN: 2.0000 mg | INTRAVENOUS | Status: AC | PRN

## 2018-01-16 MED ORDER — LIDOCAINE-EPINEPHRINE 0.5 %-1:200000 IJ SOLN
INTRAMUSCULAR | Status: DC | PRN
  Administered 2018-01-16: 10 mL

## 2018-01-16 MED ORDER — PROPOFOL 500 MG/50ML IV EMUL
INTRAVENOUS | Status: DC | PRN
  Administered 2018-01-16: 100 ug/kg/min via INTRAVENOUS

## 2018-01-16 MED ORDER — INSULIN ASPART 100 UNIT/ML ~~LOC~~ SOLN
0.0000 [IU] | Freq: Three times a day (TID) | SUBCUTANEOUS | Status: DC
  Administered 2018-01-17: 2 [IU] via SUBCUTANEOUS

## 2018-01-16 MED ORDER — ONDANSETRON HCL 4 MG/2ML IJ SOLN
INTRAMUSCULAR | Status: AC
  Filled 2018-01-16: qty 2

## 2018-01-16 MED ORDER — ONDANSETRON HCL 4 MG/2ML IJ SOLN
4.0000 mg | Freq: Four times a day (QID) | INTRAMUSCULAR | Status: DC | PRN
  Administered 2018-01-16: 4 mg via INTRAVENOUS
  Filled 2018-01-16: qty 2

## 2018-01-16 MED ORDER — HYDROCODONE-ACETAMINOPHEN 5-325 MG PO TABS: 1.0000 | ORAL_TABLET | Freq: Four times a day (QID) | ORAL | 0 refills | Status: DC | PRN

## 2018-01-16 MED ORDER — OXYCODONE HCL 5 MG/5ML PO SOLN: 5.0000 mg | Freq: Once | ORAL | Status: AC | PRN

## 2018-01-16 MED ORDER — ONDANSETRON HCL 4 MG/2ML IJ SOLN: 4.0000 mg | Freq: Once | INTRAMUSCULAR | Status: DC | PRN

## 2018-01-16 MED ORDER — HEPARIN SODIUM (PORCINE) 5000 UNIT/ML IJ SOLN
INTRAMUSCULAR | Status: AC
  Filled 2018-01-16: qty 1.2

## 2018-01-16 MED ORDER — HYDRALAZINE HCL 20 MG/ML IJ SOLN: 5.0000 mg | INTRAMUSCULAR | Status: DC | PRN

## 2018-01-16 MED ORDER — SODIUM CHLORIDE 0.9 % IV SOLN
INTRAVENOUS | Status: DC | PRN
  Administered 2018-01-16: 10:00:00

## 2018-01-16 MED ORDER — SODIUM CHLORIDE 0.9 % IV SOLN: INTRAVENOUS | Status: DC

## 2018-01-16 MED ORDER — METOPROLOL TARTRATE 5 MG/5ML IV SOLN: 2.0000 mg | INTRAVENOUS | Status: DC | PRN

## 2018-01-16 MED ORDER — MIDAZOLAM HCL 2 MG/2ML IJ SOLN
INTRAMUSCULAR | Status: AC
  Filled 2018-01-16: qty 2

## 2018-01-16 MED ORDER — 0.9 % SODIUM CHLORIDE (POUR BTL) OPTIME
TOPICAL | Status: DC | PRN
  Administered 2018-01-16: 1000 mL

## 2018-01-16 MED ORDER — MIDAZOLAM HCL 5 MG/5ML IJ SOLN
INTRAMUSCULAR | Status: DC | PRN
  Administered 2018-01-16: 2 mg via INTRAVENOUS

## 2018-01-16 MED ORDER — PNEUMOCOCCAL VAC POLYVALENT 25 MCG/0.5ML IJ INJ
0.5000 mL | INJECTION | INTRAMUSCULAR | Status: AC
  Administered 2018-01-17: 0.5 mL via INTRAMUSCULAR
  Filled 2018-01-16: qty 0.5

## 2018-01-16 MED ORDER — ONDANSETRON HCL 4 MG/2ML IJ SOLN
INTRAMUSCULAR | Status: DC | PRN
  Administered 2018-01-16: 4 mg via INTRAVENOUS

## 2018-01-16 MED ORDER — CHLORHEXIDINE GLUCONATE 4 % EX LIQD: 60.0000 mL | Freq: Once | CUTANEOUS | Status: DC

## 2018-01-16 MED ORDER — OXYCODONE HCL 5 MG PO TABS
5.0000 mg | ORAL_TABLET | Freq: Once | ORAL | Status: AC | PRN
  Administered 2018-01-16: 5 mg via ORAL

## 2018-01-16 MED ORDER — FUROSEMIDE 80 MG PO TABS
80.0000 mg | ORAL_TABLET | Freq: Two times a day (BID) | ORAL | Status: DC
  Administered 2018-01-16 – 2018-01-17 (×2): 80 mg via ORAL
  Filled 2018-01-16 (×3): qty 1

## 2018-01-16 MED ORDER — ACETAMINOPHEN 650 MG RE SUPP: 325.0000 mg | RECTAL | Status: DC | PRN

## 2018-01-16 MED ORDER — HEPARIN SODIUM (PORCINE) 1000 UNIT/ML IJ SOLN
INTRAMUSCULAR | Status: AC
  Filled 2018-01-16: qty 1

## 2018-01-16 MED ORDER — LABETALOL HCL 5 MG/ML IV SOLN
10.0000 mg | INTRAVENOUS | Status: DC | PRN
  Filled 2018-01-16: qty 4

## 2018-01-16 MED ORDER — HEPARIN SODIUM (PORCINE) 1000 UNIT/ML IJ SOLN
INTRAMUSCULAR | Status: DC | PRN
  Administered 2018-01-16: 3400 [IU]

## 2018-01-16 MED ORDER — NEPRO/CARBSTEADY PO LIQD
237.0000 mL | Freq: Two times a day (BID) | ORAL | Status: DC
  Administered 2018-01-17 (×2): 237 mL via ORAL
  Filled 2018-01-16 (×4): qty 237

## 2018-01-16 MED ORDER — SODIUM CHLORIDE 0.9 % IV SOLN
INTRAVENOUS | Status: DC
  Administered 2018-01-16: 07:00:00 via INTRAVENOUS

## 2018-01-16 MED ORDER — CEFAZOLIN SODIUM-DEXTROSE 2-4 GM/100ML-% IV SOLN
2.0000 g | INTRAVENOUS | Status: AC
  Administered 2018-01-16: 2 g via INTRAVENOUS
  Filled 2018-01-16: qty 100

## 2018-01-16 MED ORDER — PHENYLEPHRINE HCL 10 MG/ML IJ SOLN
INTRAVENOUS | Status: DC | PRN
  Administered 2018-01-16: 15 ug/min via INTRAVENOUS

## 2018-01-16 MED ORDER — FENTANYL CITRATE (PF) 100 MCG/2ML IJ SOLN: 25.0000 ug | INTRAMUSCULAR | Status: DC | PRN

## 2018-01-16 MED ORDER — PROPOFOL 10 MG/ML IV BOLUS
INTRAVENOUS | Status: DC | PRN
  Administered 2018-01-16: 10 mg via INTRAVENOUS

## 2018-01-16 SURGICAL SUPPLY — 54 items
ARMBAND PINK RESTRICT EXTREMIT (MISCELLANEOUS) ×4 IMPLANT
BAG DECANTER FOR FLEXI CONT (MISCELLANEOUS) IMPLANT
BIOPATCH RED 1 DISK 7.0 (GAUZE/BANDAGES/DRESSINGS) ×3 IMPLANT
BIOPATCH RED 1IN DISK 7.0MM (GAUZE/BANDAGES/DRESSINGS) ×1
CANISTER SUCT 3000ML PPV (MISCELLANEOUS) ×4 IMPLANT
CANNULA VESSEL 3MM 2 BLNT TIP (CANNULA) ×4 IMPLANT
CATH PALINDROME RT-P 15FX19CM (CATHETERS) IMPLANT
CATH PALINDROME RT-P 15FX23CM (CATHETERS) ×4 IMPLANT
CATH PALINDROME RT-P 15FX28CM (CATHETERS) IMPLANT
CATH PALINDROME RT-P 15FX55CM (CATHETERS) IMPLANT
CLIP LIGATING EXTRA MED SLVR (CLIP) ×4 IMPLANT
CLIP LIGATING EXTRA SM BLUE (MISCELLANEOUS) ×4 IMPLANT
COVER PROBE W GEL 5X96 (DRAPES) ×4 IMPLANT
COVER SURGICAL LIGHT HANDLE (MISCELLANEOUS) IMPLANT
DECANTER SPIKE VIAL GLASS SM (MISCELLANEOUS) ×4 IMPLANT
DERMABOND ADVANCED (GAUZE/BANDAGES/DRESSINGS) ×4
DERMABOND ADVANCED .7 DNX12 (GAUZE/BANDAGES/DRESSINGS) ×4 IMPLANT
DRAPE C-ARM 42X72 X-RAY (DRAPES) ×4 IMPLANT
DRAPE CHEST BREAST 15X10 FENES (DRAPES) ×4 IMPLANT
ELECT REM PT RETURN 9FT ADLT (ELECTROSURGICAL) ×4
ELECTRODE REM PT RTRN 9FT ADLT (ELECTROSURGICAL) ×2 IMPLANT
GLOVE BIO SURGEON STRL SZ7.5 (GLOVE) ×4 IMPLANT
GLOVE BIOGEL PI IND STRL 6.5 (GLOVE) ×12 IMPLANT
GLOVE BIOGEL PI INDICATOR 6.5 (GLOVE) ×12
GLOVE ECLIPSE 6.5 STRL STRAW (GLOVE) ×4 IMPLANT
GLOVE SS BIOGEL STRL SZ 7.5 (GLOVE) ×4 IMPLANT
GLOVE SUPERSENSE BIOGEL SZ 7.5 (GLOVE) ×4
GOWN STRL REUS W/ TWL LRG LVL3 (GOWN DISPOSABLE) ×8 IMPLANT
GOWN STRL REUS W/ TWL XL LVL3 (GOWN DISPOSABLE) ×2 IMPLANT
GOWN STRL REUS W/TWL LRG LVL3 (GOWN DISPOSABLE) ×8
GOWN STRL REUS W/TWL XL LVL3 (GOWN DISPOSABLE) ×2
KIT BASIN OR (CUSTOM PROCEDURE TRAY) ×4 IMPLANT
KIT TURNOVER KIT B (KITS) ×4 IMPLANT
NEEDLE 18GX1X1/2 (RX/OR ONLY) (NEEDLE) ×4 IMPLANT
NEEDLE 22X1 1/2 (OR ONLY) (NEEDLE) ×4 IMPLANT
NEEDLE HYPO 25GX1X1/2 BEV (NEEDLE) IMPLANT
NS IRRIG 1000ML POUR BTL (IV SOLUTION) ×4 IMPLANT
PACK CV ACCESS (CUSTOM PROCEDURE TRAY) ×4 IMPLANT
PACK SURGICAL SETUP 50X90 (CUSTOM PROCEDURE TRAY) IMPLANT
PAD ARMBOARD 7.5X6 YLW CONV (MISCELLANEOUS) ×8 IMPLANT
SOAP 2 % CHG 4 OZ (WOUND CARE) ×4 IMPLANT
SUT ETHILON 3 0 PS 1 (SUTURE) ×4 IMPLANT
SUT PROLENE 6 0 CC (SUTURE) ×4 IMPLANT
SUT VIC AB 3-0 SH 27 (SUTURE) ×2
SUT VIC AB 3-0 SH 27X BRD (SUTURE) ×2 IMPLANT
SUT VICRYL 4-0 PS2 18IN ABS (SUTURE) ×4 IMPLANT
SYR 10ML LL (SYRINGE) IMPLANT
SYR 20CC LL (SYRINGE) IMPLANT
SYR 5ML LL (SYRINGE) ×8 IMPLANT
SYR CONTROL 10ML LL (SYRINGE) IMPLANT
TOWEL GREEN STERILE (TOWEL DISPOSABLE) ×4 IMPLANT
TOWEL GREEN STERILE FF (TOWEL DISPOSABLE) IMPLANT
UNDERPAD 30X30 (UNDERPADS AND DIAPERS) ×4 IMPLANT
WATER STERILE IRR 1000ML POUR (IV SOLUTION) ×4 IMPLANT

## 2018-01-16 NOTE — Anesthesia Postprocedure Evaluation (Signed)
Anesthesia Post Note  Patient: Jazz L Uselton  Procedure(s) Performed: ARTERIOVENOUS (AV) FISTULA CREATION RIGHT ARM (Right ) INSERTION OF DIALYSIS CATHETER DIATEK RIGHT INTERNAL JUGULAR (Right Neck)     Patient location during evaluation: PACU Anesthesia Type: MAC Level of consciousness: awake and alert Pain management: pain level controlled Vital Signs Assessment: post-procedure vital signs reviewed and stable Respiratory status: spontaneous breathing, nonlabored ventilation and respiratory function stable Cardiovascular status: stable and blood pressure returned to baseline Anesthetic complications: no    Last Vitals:  Vitals:   01/16/18 1104 01/16/18 1116  BP: (!) 147/90 137/88  Pulse: 80 87  Resp: 16 13  Temp:    SpO2: 100% 96%    Last Pain:  Vitals:   01/16/18 1116  TempSrc:   PainSc: 0-No pain                 Audry Pili

## 2018-01-16 NOTE — Interval H&P Note (Signed)
History and Physical Interval Note:  01/16/2018 7:15 AM  Lisa Hodges  has presented today for surgery, with the diagnosis of CHRONIC KIDNEY DISEASE STAGE V FOR HEMODIALYSIS ACCESS  The various methods of treatment have been discussed with the patient and family. After consideration of risks, benefits and other options for treatment, the patient has consented to  Procedure(s): ARTERIOVENOUS (AV) FISTULA CREATION VERSUS GRAFT RIGHT ARM (Right) INSERTION OF DIALYSIS CATHETER DIATEK (N/A) as a surgical intervention .  The patient's history has been reviewed, patient examined, no change in status, stable for surgery.  I have reviewed the patient's chart and labs.  Questions were answered to the patient's satisfaction.     Gretta Began

## 2018-01-16 NOTE — Op Note (Signed)
    OPERATIVE REPORT  DATE OF SURGERY: 01/16/2018  PATIENT: Lisa Hodges, 32 y.o. female MRN: [id number]  DOB: August 23, 1986  PRE-OPERATIVE DIAGNOSIS: End-stage renal disease  POST-OPERATIVE DIAGNOSIS:  Same  PROCEDURE: #1 right IJ hemodialysis tunneled catheter with ultrasound visualization, #2 right brachiocephalic AV fistula creation  SURGEON:  Gretta Began, M.D.  PHYSICIAN ASSISTANT: Matt Eveland, PA-C  ANESTHESIA: Local with sedation  EBL: Minimal ml  No intake/output data recorded.  BLOOD ADMINISTERED: None  DRAINS: None  SPECIMEN: None  COUNTS CORRECT:  YES  PLAN OF CARE: PACU with chest x-ray pending  PATIENT DISPOSITION:  PACU - hemodynamically stable  PROCEDURE DETAILS: Patient was taken to the operative placed in supine position where the area of the right and left neck and chest were prepped and draped in usual sterile fashion.  Using SonoSite visualization with the patient in Trendelenburg position and local anesthesia the right internal jugular vein was entered at the base of the neck and a guidewire was passed down to the level of the right atrium.  This was confirmed with fluoroscopy.  Dilator and peel-away sheath was passed over the guidewire and the dilator and guidewire removed.  A 23 cm hemodialysis catheter was passed through the peel-away sheath and was position of the level of the distal right atrium.  The peel-away sheath was removed.  Catheter was brought through a subcutaneous tunnel through a separate stab incision and the 2 lm ports were attached.  Both lumens flushed and aspirated easily and were locked with 1000 units/cc heparin.  The catheter was secured to the skin with 3-0 nylon stitch and the entry site was closed with a 4-0 subcuticular Vicryl stitch.  Attention was then turned to the right arm.  Right arm was prepped and draped in usual sterile fashion.  SonoSite ultrasound was used to visualize the veins.  The patient did have a good caliber  antecubital vein and patency of her cephalic vein throughout the upper arm.  Incision was made using local anesthesia across antecubital space and could not isolate the cephalic vein which was of good caliber in the brachial artery which was also of good caliber.  The cephalic vein was ligated distally and divided and was mobilized to the level of the brachial artery.  The artery was occluded proximally distally and was opened with an 11 blade and sent longitudinally with Potts scissors.  A small arteriotomy was created.  The vein was cut to the appropriate length and was sewn end-to-side to the artery with a running 6-0 Prolene suture.  Clamps removed and excellent thrill was noted.  The wounds irrigated with saline.  Hemostasis checked cautery.  Wounds were closed with 3-0 Vicryl in the subcutaneous and subcuticular tissue.  The patient did have a good Doppler flow at the radial artery with slight augmentation with occlusion of the cephalic vein fistula.  Patient was transferred to the recovery room in stable condition with chest x-ray pending   [name], M.D., [facility] 01/16/2018 10:46 AM

## 2018-01-16 NOTE — Transfer of Care (Signed)
Immediate Anesthesia Transfer of Care Note  Patient: Lisa Hodges  Procedure(s) Performed: ARTERIOVENOUS (AV) FISTULA CREATION RIGHT ARM (Right ) INSERTION OF DIALYSIS CATHETER DIATEK RIGHT INTERNAL JUGULAR (Right Neck)  Patient Location: PACU  Anesthesia Type:MAC  Level of Consciousness: drowsy and patient cooperative  Airway & Oxygen Therapy: Patient Spontanous Breathing and Patient connected to face mask oxygen  Post-op Assessment: Report given to RN, Post -op Vital signs reviewed and stable and Patient moving all extremities X 4  Post vital signs: Reviewed and stable  Last Vitals:  Vitals Value Taken Time  BP 126/80 01/16/2018 10:49 AM  Temp 36.8 C 01/16/2018 10:49 AM  Pulse 82 01/16/2018 10:50 AM  Resp 18 01/16/2018 10:50 AM  SpO2 100 % 01/16/2018 10:50 AM  Vitals shown include unvalidated device data.  Last Pain:  Vitals:   01/16/18 0703  TempSrc:   PainSc: 0-No pain      Patients Stated Pain Goal: 3 (32/35/57 3220)  Complications: No apparent anesthesia complications

## 2018-01-17 ENCOUNTER — Telehealth: Payer: Self-pay | Admitting: Vascular Surgery

## 2018-01-17 ENCOUNTER — Encounter (HOSPITAL_COMMUNITY): Payer: Self-pay | Admitting: Vascular Surgery

## 2018-01-17 DIAGNOSIS — I132 Hypertensive heart and chronic kidney disease with heart failure and with stage 5 chronic kidney disease, or end stage renal disease: Secondary | ICD-10-CM | POA: Diagnosis not present

## 2018-01-17 LAB — CBC
HCT: 32.1 % — ABNORMAL LOW (ref 36.0–46.0)
Hemoglobin: 9.9 g/dL — ABNORMAL LOW (ref 12.0–15.0)
MCH: 20.8 pg — ABNORMAL LOW (ref 26.0–34.0)
MCHC: 30.8 g/dL (ref 30.0–36.0)
MCV: 67.3 fL — ABNORMAL LOW (ref 78.0–100.0)
PLATELETS: 256 10*3/uL (ref 150–400)
RBC: 4.77 MIL/uL (ref 3.87–5.11)
RDW: 19.1 % — AB (ref 11.5–15.5)
WBC: 5 10*3/uL (ref 4.0–10.5)

## 2018-01-17 LAB — COMPREHENSIVE METABOLIC PANEL
ALBUMIN: 1.9 g/dL — AB (ref 3.5–5.0)
ALT: 10 U/L — ABNORMAL LOW (ref 14–54)
ANION GAP: 10 (ref 5–15)
AST: 23 U/L (ref 15–41)
Alkaline Phosphatase: 53 U/L (ref 38–126)
BILIRUBIN TOTAL: 0.4 mg/dL (ref 0.3–1.2)
BUN: 52 mg/dL — AB (ref 6–20)
CHLORIDE: 109 mmol/L (ref 101–111)
CO2: 21 mmol/L — ABNORMAL LOW (ref 22–32)
Calcium: 7.4 mg/dL — ABNORMAL LOW (ref 8.9–10.3)
Creatinine, Ser: 5.73 mg/dL — ABNORMAL HIGH (ref 0.44–1.00)
GFR calc Af Amer: 10 mL/min — ABNORMAL LOW (ref >60)
GFR, EST NON AFRICAN AMERICAN: 9 mL/min — AB (ref >60)
Glucose, Bld: 103 mg/dL — ABNORMAL HIGH (ref 65–99)
POTASSIUM: 3.9 mmol/L (ref 3.5–5.1)
Sodium: 140 mmol/L (ref 135–145)
TOTAL PROTEIN: 5.2 g/dL — AB (ref 6.5–8.1)

## 2018-01-17 LAB — GLUCOSE, CAPILLARY
Glucose-Capillary: 88 mg/dL (ref 65–99)
Glucose-Capillary: 150 mg/dL — ABNORMAL HIGH (ref 65–99)

## 2018-01-17 NOTE — Progress Notes (Signed)
01/17/2018 11:50 AM   Pascal Lux MD, PA-Tina returned paged.   Informed Clinical research associate that education and discharge information has been already been provided to patient around 7am by [place] PA.   Inetta Fermo PA stated that if further education is needed then the HD RNs will need to further educate the patient.   HD RNs paged. Melissa RN will return phone call back.   IV consult ordered.   Makena Mcgrady MSN, RN-BC, CNML [facility] Renal Phone: [phone]

## 2018-01-17 NOTE — Discharge Instructions (Signed)
° °  [facility]  Discharge Instructions  AV Fistula or Graft Surgery for Dialysis Access  Please refer to the following instructions for your post-procedure care. Your surgeon or physician assistant will discuss any changes with you.  Activity  You may drive the day following your surgery, if you are comfortable and no longer taking prescription pain medication. Resume full activity as the soreness in your incision resolves.  Bathing/Showering  You may shower after you go home. Keep your incision dry for 48 hours. Do not soak in a bathtub, hot tub, or swim until the incision heals completely. You may not shower if you have a hemodialysis catheter.  Incision Care  Clean your incision with mild soap and water after 48 hours. Pat the area dry with a clean towel. You do not need a bandage unless otherwise instructed. Do not apply any ointments or creams to your incision. You may have skin glue on your incision. Do not peel it off. It will come off on its own in about one week. Your arm may swell a bit after surgery. To reduce swelling use pillows to elevate your arm so it is above your heart. Your doctor will tell you if you need to lightly wrap your arm with an ACE bandage.  Diet  Resume your normal diet. There are not special food restrictions following this procedure. In order to heal from your surgery, it is CRITICAL to get adequate nutrition. Your body requires vitamins, minerals, and protein. Vegetables are the best source of vitamins and minerals. Vegetables also provide the perfect balance of protein. Processed food has little nutritional value, so try to avoid this.  Medications  Resume taking all of your medications. If your incision is causing pain, you may take over-the counter pain relievers such as acetaminophen (Tylenol). If you were prescribed a stronger pain medication, please be aware these medications can cause nausea and constipation. Prevent  nausea by taking the medication with a snack or meal. Avoid constipation by drinking plenty of fluids and eating foods with high amount of fiber, such as fruits, vegetables, and grains. Do not take Tylenol if you are taking prescription pain medications.     Follow up Your surgeon may want to see you in the office following your access surgery. If so, this will be arranged at the time of your surgery.  Please call us immediately for any of the following conditions:  Increased pain, redness, drainage (pus) from your incision site Fever of 101 degrees or higher Severe or worsening pain at your incision site Hand pain or numbness.  Reduce your risk of vascular disease:  Stop smoking. If you would like help, call [facility] at 1-800-QUIT-NOW (214-383-5922) or [place] at 604-751-7144  Manage your cholesterol Maintain a desired weight Control your diabetes Keep your blood pressure down  Dialysis  It will take several weeks to several months for your new dialysis access to be ready for use. Your surgeon will determine when it is OK to use it. Your nephrologist will continue to direct your dialysis. You can continue to use your Permcath until your new access is ready for use.  If you have any questions, please call the office at [phone].    Dialysis catheter care Keep catheter site dry Do not shower with catheter Can wash visible dirt with soap and water; pat to dry after cleansing Call your Nephrologist with any further questions about catheter care

## 2018-01-17 NOTE — Progress Notes (Signed)
Called  Vascular Dr. Arbie Cookey.  Office stated to call Dr. Pascal Lux who here today.  PA Matt stated for patient to follow-up with Nephrology for dialysis.  Pt of [facility].  Advised to follow-up with nephrologist.  Vascular will put in nephrology consult.

## 2018-01-17 NOTE — Telephone Encounter (Signed)
unable to LVM for pt # not taking calls, mld lttr for appt 6/11 [place] and OV

## 2018-01-17 NOTE — Progress Notes (Signed)
01/17/2018 11:37 AM  Gabriel Cirri NS made appointment for patient to follow up with Eliott Nine Renal MD on 5/2.   Paged [facility].   Spoke with her in regards to patient hew HD catheter and fistula. Per patient, she has no knowledge of how to care for the catheter. Delorise Shiner PA stated this education should have been completed by the surgeons.   Paged Pascal Lux MD.   Informed attending nurse Inetta Fermo and Charge Nurse Kami.   Chassity Ludke MSN, RN-BC, CNML [facility] Phone: [phone]

## 2018-01-17 NOTE — Progress Notes (Signed)
Repaged Dr. Pascal Lux.

## 2018-01-17 NOTE — Discharge Summary (Addendum)
Discharge Summary    Lisa Hodges [birth year] 32 y.o. female  [id number]  Admission Date: 01/16/2018  Discharge Date: 01/17/18  Physician: [name], MD  Admission Diagnosis: CHRONIC KIDNEY DISEASE STAGE V FOR HEMODIALYSIS ACCESS   HPI:   This is a 32 y.o. female with CKD stage V not yet on dialysis  Hospital Course:  The patient was admitted to the hospital and taken to the operating room on 01/16/2018 and underwent: Right IJ tunneled dialysis catheter placement as well as right brachiocephalic fistula creation by Dr. Arbie Cookey.  The pt tolerated the procedure well and was transported to the PACU in good condition.   The remainder of the hospital course consisted of increasing mobilization and increasing intake of solids without difficulty.  POD #1 right tunneled dialysis catheter exit site from skin is unremarkable with no palpable hematoma.  Right AC fossa incision also unremarkable without hematoma or drainage.  There is a palpable thrill and audible bruit in right upper arm to mid upper arm indicating a patent brachiocephalic fistula.  There is also a palpable right radial pulse and patient denies any signs or symptoms of a steal syndrome in her right hand.  She will follow-up in 4 to 6 weeks with fistula duplex to assess maturation of right arm brachiocephalic fistula.  She will be prescribed 1 to 2 days of narcotic pain medication for continued postoperative pain control.  Discharge instructions were reviewed with the patient and she voices her understanding.  Social work has been consulted this morning to help arrange transportation home.  She will be discharged after transportation is arranged today in stable condition.  CBC    Component Value Date/Time   WBC 5.0 01/17/2018 0544   RBC 4.77 01/17/2018 0544   HGB 9.9 (L) 01/17/2018 0544   HCT 32.1 (L) 01/17/2018 0544   PLT 256 01/17/2018 0544   MCV 67.3 (L) 01/17/2018 0544   MCH 20.8 (L) 01/17/2018 0544   MCHC 30.8  01/17/2018 0544   RDW 19.1 (H) 01/17/2018 0544   LYMPHSABS 1.8 12/02/2017 0313   MONOABS 0.5 12/02/2017 0313   EOSABS 0.1 12/02/2017 0313   BASOSABS 0.0 12/02/2017 0313    BMET    Component Value Date/Time   NA 140 01/17/2018 0544   K 3.9 01/17/2018 0544   CL 109 01/17/2018 0544   CO2 21 (L) 01/17/2018 0544   GLUCOSE 103 (H) 01/17/2018 0544   BUN 52 (H) 01/17/2018 0544   CREATININE 5.73 (H) 01/17/2018 0544   CALCIUM 7.4 (L) 01/17/2018 0544   GFRNONAA 9 (L) 01/17/2018 0544   GFRAA 10 (L) 01/17/2018 0544        Discharge Diagnosis:  CHRONIC KIDNEY DISEASE STAGE V FOR HEMODIALYSIS ACCESS  Secondary Diagnosis: Patient Active Problem List   Diagnosis Date Noted  . CKD (chronic kidney disease) 01/16/2018  . AKI (acute kidney injury) (HCC)   . Acute on chronic renal failure (HCC) 11/26/2017  . Microcytic anemia 11/26/2017  . Hypertensive urgency 11/26/2017  . Medication management 01/29/2017  . Morbid obesity (HCC) 11/16/2016  . 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 Acute respiratory failure (HCC) 10/26/2016  . Acute on chronic systolic CHF (congestive heart failure) (HCC) 10/26/2016  . Chronic renal insufficiency, stage III (moderate) (HCC) 10/26/2016  . Hypertension 10/26/2016  . Insulin dependent diabetes mellitus with complications (HCC) 10/26/2015   Past Medical History:  Diagnosis Date  . CKD (chronic kidney disease), stage V (HCC)    Sees Dr Eliott Nine -  [facility]  . Depression   . Dyspnea    with too much fluid  . Hypertension   . Migraine    "a few/year" (01/16/2018)  . PONV (postoperative nausea and vomiting)   . Systolic CHF (HCC)   . Type II diabetes mellitus (HCC)      Allergies as of 01/17/2018   No Known Allergies     Medication List    TAKE these medications   carvedilol 12.5 MG tablet Commonly known as:  COREG Take 1 tablet (12.5 mg total) by mouth 2 (two) times daily with a meal.   furosemide 40 MG  tablet Commonly known as:  LASIX Take 2 tablets (80 mg total) by mouth 2 (two) times daily.   HYDROcodone-acetaminophen 5-325 MG tablet Commonly known as:  NORCO Take 1 tablet by mouth every 6 (six) hours as needed for moderate pain.   insulin aspart protamine- aspart (70-30) 100 UNIT/ML injection Commonly known as:  NOVOLOG MIX 70/30 Inject 0.08 mLs (8 Units total) into the skin 2 (two) times daily with a meal.   IRON PO Take 1 tablet by mouth daily.   isosorbide-hydrALAZINE 20-37.5 MG tablet Commonly known as:  BIDIL Take 2 tablets by mouth 3 (three) times daily.   RENVELA 0.8 g Pack packet Generic drug:  sevelamer carbonate Take 1 packet by mouth 3 times daily with meals        Instructions: [facility] Discharge Instructions AV Fistula or Graft Surgery for Dialysis Access  Please refer to the following instructions for your post-procedure care. Your surgeon or physician assistant will discuss any changes with you.  Activity  You may drive the day following your surgery, if you are comfortable and no longer taking prescription pain medication. Resume full activity as the soreness in your incision resolves.  Bathing/Showering  You may shower after you go home. Keep your incision dry for 48 hours. Do not soak in a bathtub, hot tub, or swim until the incision heals completely. You may not shower if you have a hemodialysis catheter.  Incision Care  Clean your incision with mild soap and water after 48 hours. Pat the area dry with a clean towel. You do not need a bandage unless otherwise instructed. Do not apply any ointments or creams to your incision. You may have skin glue on your incision. Do not peel it off. It will come off on its own in about one week. Your arm may swell a bit after surgery. To reduce swelling use pillows to elevate your arm so it is above your heart. Your doctor will tell you if you need to lightly wrap your arm with an  ACE bandage.  Diet  Resume your normal diet. There are not special food restrictions following this procedure. In order to heal from your surgery, it is CRITICAL to get adequate nutrition. Your body requires vitamins, minerals, and protein. Vegetables are the best source of vitamins and minerals. Vegetables also provide the perfect balance of protein. Processed food has little nutritional value, so try to avoid this.  Medications  Resume taking all of your medications. If your incision is causing pain, you may take over-the counter pain relievers such as acetaminophen (Tylenol). If you were prescribed a stronger pain medication, please be aware these medications can cause nausea and constipation. Prevent nausea by taking the medication with a snack or meal. Avoid constipation by drinking plenty of fluids and eating foods with high amount of fiber, such as  fruits, vegetables, and grains. Do not take Tylenol if you are taking prescription pain medications.  Follow up Your surgeon may want to see you in the office following your access surgery. If so, this will be arranged at the time of your surgery.  Please call us immediately for any of the following conditions:  Increased pain, redness, drainage (pus) from your incision site Fever of 101 degrees or higher Severe or worsening pain at your incision site Hand pain or numbness.  Reduce your risk of vascular disease:  Stop smoking. If you would like help, call [facility] at 1-800-QUIT-NOW (310-383-3947) or [place] at (406)493-4555  Manage your cholesterol Maintain a desired weight Control your diabetes Keep your blood pressure down  Dialysis  It will take several weeks to several months for your new dialysis access to be ready for use. Your surgeon will determine when it is OK to use it. Your nephrologist will continue to direct your dialysis. You can continue to use your Permcath until your new access is ready for  use.   01/17/2018 [facility] [id number] 1985/09/30  Surgeon(s): Early, Kristen Loader, MD  Procedure(s): ARTERIOVENOUS (AV) FISTULA CREATION RIGHT ARM INSERTION OF DIALYSIS CATHETER DIATEK RIGHT INTERNAL JUGULAR        x Do not stick fistula for 12 weeks    If you have any questions, please call the office at [phone].  Disposition: home  Patient's condition: is Good  Follow up: 1. Dr. Arbie Cookey in 4-6 weeks   Emilie Rutter, PA-C [facility] [phone] 01/17/2018  7:35 AM

## 2018-01-17 NOTE — Telephone Encounter (Signed)
-----   Message from Sharee Pimple, RN sent at 01/16/2018  7:56 PM EDT ----- Regarding: 4-6 weeks   ----- Message ----- From: Forestine Na Sent: 01/16/2018  10:48 AM To: Vvs Charge Pool  Can you schedule an appt for this pt in 4-6 weeks with fistula duplex to see Dr. Arbie Cookey.  PO R brachiocephalic creation. Thanks, [employer]

## 2018-01-17 NOTE — Progress Notes (Signed)
Pt discharge instructions given with prescription.  Pt verbalized understanding.  Instructed and educated patient to follow-up with nephrologist and not to take baths or get HD site wet until Nephrology appt.  Educational material given for observation of R fistula and R HD sites. Nephrology appt 01-24-18 at [facility].

## 2018-01-23 ENCOUNTER — Other Ambulatory Visit: Payer: Self-pay

## 2018-01-23 DIAGNOSIS — N185 Chronic kidney disease, stage 5: Secondary | ICD-10-CM

## 2018-01-23 DIAGNOSIS — Z48812 Encounter for surgical aftercare following surgery on the circulatory system: Secondary | ICD-10-CM

## 2018-01-28 ENCOUNTER — Telehealth: Payer: Self-pay | Admitting: Cardiology

## 2018-01-28 NOTE — Telephone Encounter (Signed)
Received records from [facility] on 01/28/18, Appt 03/01/18 @ 1:40pm. [place]

## 2018-02-08 ENCOUNTER — Telehealth (HOSPITAL_COMMUNITY): Payer: Self-pay

## 2018-02-08 ENCOUNTER — Telehealth (HOSPITAL_COMMUNITY): Payer: Self-pay | Admitting: Surgery

## 2018-02-08 NOTE — Telephone Encounter (Signed)
Pt called to schedule CHP visit; pt is on monthly visits due to her taking all of her meds without difficulty.  Pt still had issues with retaining fluid and she's awaiting dialysis.  No answer/f/u call next week.

## 2018-02-08 NOTE — Telephone Encounter (Signed)
Ms. Lisa Hodges will be discharge from the HF [facility] at this time.  [facility] [employer] reports that patient has now started dialysis.  She is also independently managing her own medications and appointments.  She was encouraged to call the HF Clinic with any concerns or questions.

## 2018-02-12 ENCOUNTER — Other Ambulatory Visit (HOSPITAL_COMMUNITY): Payer: Self-pay

## 2018-02-13 ENCOUNTER — Telehealth (HOSPITAL_COMMUNITY): Payer: Self-pay | Admitting: Surgery

## 2018-02-13 ENCOUNTER — Encounter (HOSPITAL_COMMUNITY): Payer: Medicaid Other

## 2018-02-13 NOTE — Progress Notes (Signed)
Pt called today and asked if I could pick up her medications.  Pt is unable to afford them and has no way to pick them up.  Annice Pih from heart failure clinic provided money from the heart failure fund.  The medication was dropped off to pt.  Due to her current fiancial situation and her being new to dialysis, Ramond Craver and I agree to keep pt on [facility] for a little while longer.

## 2018-02-13 NOTE — Telephone Encounter (Signed)
Upon further conversation with [facility] Par[facility]-the decision was made to retain Lisa Hodges in the [employer] secondary to depression and financial contraints at this time.  She will be seen monthly and continue to follow with the AHF Clinic for now.

## 2018-02-20 NOTE — Progress Notes (Signed)
Primary HF Cardiologist: Dr Shirlee Latch  Primary Physician: Leilani Able, MD Cardiologist:  Dr Antoine Poche  HPI: Lisa Hodges is a 32 y.o. female with a history of DMI, HTN, CKD Stage III, and chronic systolic heart failure.   Diagnosed with heart failure 10/2016 during hospital admit. She had been treated for pneumonia prior to admit. Diuresed with IV lasix and transitioned to lasix 40 mg po daily, hydralazine 10 mg tid, and imdur 15 mg daily. Discharge creatinine 1.89.   Last seen by Dr Antoine Poche in May 2018. At that time lasix was increased to 60 mg daily. Weight was 191 pounds.   Admitted 3/4 with volume overload. She had been out of insulin for over 1 month and taking medications intermittently. Diuresed with IV lasix and transitioned lasix 60 mg twice a day. Creatinine on admit was 4.9 so she was referred to nephrology and has follow up April 4th.   She presents today for regular follow up. No change at last visit. Now has HD placement. Started HD May 14th. Going M/W/F. Meds have been adjusted and not taking on HD days. Weight down ~20 lbs at home since starting. She is taking all medication as directed. She lives with her blind boyfriend and disabled daughter (Cerebral Palsy). Has transport through medicaid.  Paramedicine following.   11/26/2017 Echo  Left ventricle: The cavity size was normal. Systolic function was moderately reduced. The estimated ejection fraction was in the range of 35% to 40%. Moderate diffuse hypokinesis with regional variations. Features are consistent with a pseudonormal left ventricular filling pattern, with concomitant abnormal relaxation and increased filling pressure (grade 2 diastolic dysfunction). Doppler parameters are consistent with high ventricular filling pressure. - Aortic valve: There was trivial regurgitation. - Mitral valve: There was mild regurgitation. - Pulmonary arteries: PA peak pressure: 38 mm Hg (S). - Pericardium, extracardiac:  A small to moderate, free-flowing pericardial effusion was identified circumferential to the heart. The fluid had no internal echoes.  10/2016  - Left ventricle: The cavity size was normal. Wall thickness was increased in a pattern of mild LVH. Systolic function was moderately to severely reduced. The estimated ejection fraction was in the range of 30% to 35%. Diffuse hypokinesis. - Aortic valve: There was mild regurgitation. - Mitral valve: There was mild regurgitation. - Pericardium, extracardiac: A small pericardial effusion was identified.  Lexi Scan 2018  Low risk study   Review of systems complete and found to be negative unless listed in HPI.    SH:  Social History   Socioeconomic History  . Marital status: Single    Spouse name: Not on file  . Number of children: Not on file  . Years of education: Not on file  . Highest education level: Not on file  Occupational History  . Not on file  Social Needs  . Financial resource strain: Not on file  . Food insecurity:    Worry: Not on file    Inability: Not on file  . Transportation needs:    Medical: Not on file    Non-medical: Not on file  Tobacco Use  . Smoking status: Never Smoker  . Smokeless tobacco: Never Used  Substance and Sexual Activity  . Alcohol use: No  . Drug use: No  . Sexual activity: Not Currently  Lifestyle  . Physical activity:    Days per week: Not on file    Minutes per session: Not on file  . Stress: Not on file  Relationships  . Social connections:  Talks on phone: Not on file    Gets together: Not on file    Attends religious service: Not on file    Active member of club or organization: Not on file    Attends meetings of clubs or organizations: Not on file    Relationship status: Not on file  . Intimate partner violence:    Fear of current or ex partner: Not on file    Emotionally abused: Not on file    Physically abused: Not on file    Forced sexual activity: Not  on file  Other Topics Concern  . Not on file  Social History Narrative  . Not on file    FH:  Family History  Problem Relation Age of Onset  . Diabetes Mother   . Kidney failure Mother     Past Medical History:  Diagnosis Date  . CKD (chronic kidney disease), stage V (HCC)    Sees Dr Eliott Nine - [facility]  . Depression   . Dyspnea    with too much fluid  . Hypertension   . Migraine    "a few/year" (01/16/2018)  . PONV (postoperative nausea and vomiting)   . Systolic CHF (HCC)   . Type II diabetes mellitus (HCC)     Current Outpatient Medications  Medication Sig Dispense Refill  . carvedilol (COREG) 12.5 MG tablet Take 1 tablet (12.5 mg total) by mouth 2 (two) times daily with a meal. 60 tablet 0  . insulin aspart protamine- aspart (NOVOLOG MIX 70/30) (70-30) 100 UNIT/ML injection Inject 0.08 mLs (8 Units total) into the skin 2 (two) times daily with a meal. 4.8 mL 0  . isosorbide-hydrALAZINE (BIDIL) 20-37.5 MG tablet Take 2 tablets by mouth 3 (three) times daily. 180 tablet 11  . RENVELA 0.8 g PACK packet Take 1 packet by mouth 3 times daily with meals  0   No current facility-administered medications for this encounter.    Vitals:   02/21/18 0832  BP: 120/78  Pulse: 93  SpO2: 100%  Weight: 186 lb (84.4 kg)    Wt Readings from Last 3 Encounters:  02/21/18 186 lb (84.4 kg)  01/16/18 205 lb 14.6 oz (93.4 kg)  01/11/18 199 lb (90.3 kg)   PHYSICAL EXAM: General: Well appearing. No resp difficulty. HEENT: Normal Neck: Supple. JVP 7-8 cm. Carotids 2+ bilat; no bruits. No thyromegaly or nodule noted. Cor: PMI nondisplaced. RRR, No M/G/R noted. RIJ tunneled HD cath. Lungs: CTAB, normal effort. Abdomen: Soft, non-tender, non-distended, no HSM. No bruits or masses. +BS  Extremities: No cyanosis, clubbing, or rash. R and LLE no edema. +RUE AVF. Neuro: Alert & orientedx3, cranial nerves grossly intact. moves all 4 extremities w/o difficulty. Affect pleasant    ASSESSMENT & PLAN: 1. Acute on chronic systolic CHF: Cardiomyopathy of uncertain etiology. Echo 3/19 with EF 35-40%, moderately dilated LV.  Has RFs for CAD with type II diabetes with onset at age 52 and HTN. HIV negative. However, never had cath due to elevated creatinine. Also could be related to prior viral myocarditis or even long-standing diabetes or HTN. SPEP negative.Recent admission complicated by ?AKI with creatinine up to 5 on presentation.  - Myoview 02/13/17 with no areas of reversible ischemia, overall appears to be NICM.  - NYHA II now on dialysis - Volume status much improved.  - Volume per HD.  - Continue carvedilol 12. 5mg  BID. OK to hold on HD days.  - Continue bidil 2 tabs TID. OK to hold on HD  days.  - Reinforced fluid restriction to < 2 L daily, sodium restriction to less than 2000 mg daily, and the importance of daily weights.    2. CKD Stage IV - Renal US 11/26/2017 unremarkable. Suspect underlying diabetic nephropathy.  - Followed by nephrology. Nearing HD.  - RIJ tunneld HD cat and R AVF placed 01/16/18.   3. HTN: - Meds as above.   4. DM:  - Started as type II on po meds at age 40.  - Per PCP. Has heavy proteinuria, likely diabetic nephropathy.  5. Anemia:  - Fe deficient, may be due to heavy menses.  - Has required feraheme previously  She is now ESRD on dialysis. Will have see Dr. Shirlee Latch in 3-4 months, then can likely [facility] back to [facility]. Pt knows to call back sooner with symptoms.   Graciella Freer, PA-C  8:55 AM   Greater than 50% of the 25 minute visit was spent in counseling/coordination of care regarding disease state education, salt/fluid restriction, sliding scale diuretics, and medication compliance.

## 2018-02-21 ENCOUNTER — Other Ambulatory Visit (HOSPITAL_COMMUNITY): Payer: Self-pay

## 2018-02-21 ENCOUNTER — Encounter (HOSPITAL_COMMUNITY): Payer: Self-pay

## 2018-02-21 ENCOUNTER — Ambulatory Visit (HOSPITAL_COMMUNITY)
Admission: RE | Admit: 2018-02-21 | Discharge: 2018-02-21 | Disposition: A | Discharge status: Home / Self Care | Payer: Medicaid Other | Source: Ambulatory Visit | Attending: Cardiology | Admitting: Cardiology

## 2018-02-21 VITALS — BP 120/78 | HR 93 | Wt 186.0 lb

## 2018-02-21 DIAGNOSIS — E118 Type 2 diabetes mellitus with unspecified complications: Secondary | ICD-10-CM

## 2018-02-21 DIAGNOSIS — D509 Iron deficiency anemia, unspecified: Secondary | ICD-10-CM | POA: Diagnosis not present

## 2018-02-21 DIAGNOSIS — E1122 Type 2 diabetes mellitus with diabetic chronic kidney disease: Secondary | ICD-10-CM | POA: Diagnosis not present

## 2018-02-21 DIAGNOSIS — N179 Acute kidney failure, unspecified: Secondary | ICD-10-CM | POA: Diagnosis not present

## 2018-02-21 DIAGNOSIS — I132 Hypertensive heart and chronic kidney disease with heart failure and with stage 5 chronic kidney disease, or end stage renal disease: Secondary | ICD-10-CM | POA: Diagnosis not present

## 2018-02-21 DIAGNOSIS — R809 Proteinuria, unspecified: Secondary | ICD-10-CM | POA: Insufficient documentation

## 2018-02-21 DIAGNOSIS — N186 End stage renal disease: Secondary | ICD-10-CM | POA: Insufficient documentation

## 2018-02-21 DIAGNOSIS — Z833 Family history of diabetes mellitus: Secondary | ICD-10-CM | POA: Insufficient documentation

## 2018-02-21 DIAGNOSIS — I5023 Acute on chronic systolic (congestive) heart failure: Secondary | ICD-10-CM | POA: Insufficient documentation

## 2018-02-21 DIAGNOSIS — IMO0001 Reserved for inherently not codable concepts without codable children: Secondary | ICD-10-CM

## 2018-02-21 DIAGNOSIS — Z841 Family history of disorders of kidney and ureter: Secondary | ICD-10-CM | POA: Insufficient documentation

## 2018-02-21 DIAGNOSIS — Z79899 Other long term (current) drug therapy: Secondary | ICD-10-CM | POA: Diagnosis not present

## 2018-02-21 DIAGNOSIS — I5022 Chronic systolic (congestive) heart failure: Secondary | ICD-10-CM | POA: Diagnosis not present

## 2018-02-21 DIAGNOSIS — Z992 Dependence on renal dialysis: Secondary | ICD-10-CM

## 2018-02-21 DIAGNOSIS — I429 Cardiomyopathy, unspecified: Secondary | ICD-10-CM | POA: Insufficient documentation

## 2018-02-21 DIAGNOSIS — Z794 Long term (current) use of insulin: Secondary | ICD-10-CM | POA: Diagnosis not present

## 2018-02-21 MED ORDER — CARVEDILOL 12.5 MG PO TABS: 12.5000 mg | ORAL_TABLET | Freq: Two times a day (BID) | ORAL | 11 refills | Status: DC

## 2018-02-21 MED ORDER — ISOSORB DINITRATE-HYDRALAZINE 20-37.5 MG PO TABS: 2.0000 | ORAL_TABLET | Freq: Three times a day (TID) | ORAL | 11 refills | Status: DC

## 2018-02-21 NOTE — Progress Notes (Signed)
Paramedicine Encounter   Patient ID: Lisa Hodges , female,   DOB: 12-24-1985,32 y.o.,  MRN: [id number]   Met patient in clinic today with provider Jonni Sanger.   Pt reports that her physician at dialysis has stopped lasix, decreased bidil, and increase Renvela; no change in novolog and carvedilol.  Pt has no issues with affording her medications. She stated that she's depressed as much as before. She stated that her breathing is "much better" and she can get around better. Vitals are a lot better also. rx verified by nurses during the visit.    Time spent with patient 15 mins  Jermain Curt, EMT-Paramedic 02/21/2018   ACTION: Next visit planned for next month

## 2018-02-21 NOTE — Patient Instructions (Signed)
Refills sent to pharmacy.  No changes to medication at this time.  Follow up 3-4 months with Dr. Shirlee Latch.  ___________________________________________________________ [place] Code:   Take all medication as prescribed the day of your appointment. Bring all medications with you to your appointment.  Do the following things EVERYDAY: 1) Weigh yourself in the morning before breakfast. Write it down and keep it in a log. 2) Take your medicines as prescribed 3) Eat low salt foods-Limit salt (sodium) to 2000 mg per day.  4) Stay as active as you can everyday 5) Limit all fluids for the day to less than 2 liters

## 2018-02-21 NOTE — Progress Notes (Signed)
Should get right/left cath now that on HD. Could do next week.

## 2018-02-22 ENCOUNTER — Telehealth: Payer: Self-pay | Admitting: Licensed Clinical Social Worker

## 2018-02-22 NOTE — Telephone Encounter (Signed)
CSW received referral to follow up with patient from clinic visit. Patient in the past had shared concerns of depression and feeling overwhelmed with health issues. CSW contacted patient via phone and patient adamantly denied any concerns stating I am feeling fine. Patient states she has all her medications and denies any concerns. Patient grateful for the call. Lasandra Beech, LCSW, CCSW-MCS [phone]

## 2018-02-28 NOTE — Progress Notes (Deleted)
Cardiology Office Note   Date:  02/28/2018   ID:  Lisa Hodges, DOB 10-18-1985, MRN [id number]  PCP:  Leilani Able, MD  Cardiologist:   Rollene Rotunda, MD    CC:  Cardiomyopathy    History of Present Illness: Lisa Hodges is a 32 y.o. female who presents for follow up of cardiomyopathy with an EF of 30% (presumably NICM with global HK).  She had a [employer] which had no ischemia.   She was last seen in the HF clinic.  Her last EF was 30 - 35% in May.  She has had a negative SPEP.   Dr. Shirlee Latch had suggested a right and left heart cath.   ***    by [name] in Feb.  She was doing well at that time and seemed to be euvolemic.  Of note she has not yet had a work up of her cardiomyopathy.    She has been working very hard watching her salt, weighing herself daily, understanding and taking her medications. She takes care of her disabled daughter. She doesn't drive and so her transportation is limited. She is really doing a great job. She does get some shortness of breath with activity but this is better than when she was admitted. She still has lower extremity swelling in her weights up a couple of pounds although typically fluctuates around the 185 which was her discharge weight. The patient denies any new symptoms such as chest discomfort, neck or arm discomfort. There has been no new shortness of breath, PND or orthopnea. There have been no reported palpitations, presyncope or syncope.  Past Medical History:  Diagnosis Date  . CKD (chronic kidney disease), stage V (HCC)    Sees Dr Eliott Nine - [facility]  . Depression   . Dyspnea    with too much fluid  . Hypertension   . Migraine    "a few/year" (01/16/2018)  . PONV (postoperative nausea and vomiting)   . Systolic CHF (HCC)   . Type II diabetes mellitus (HCC)     Past Surgical History:  Procedure Laterality Date  . AV FISTULA PLACEMENT Right 01/16/2018  . AV FISTULA PLACEMENT Right 01/16/2018   Procedure:  ARTERIOVENOUS (AV) FISTULA CREATION RIGHT ARM;  Surgeon: [name], MD;  Location: [facility] OR;  Service: Vascular;  Laterality: Right;  . CESAREAN SECTION  2009  . INSERTION OF DIALYSIS CATHETER Right 01/16/2018   DIATEK RIGHT INTERNAL JUGULAR  . INSERTION OF DIALYSIS CATHETER Right 01/16/2018   Procedure: INSERTION OF DIALYSIS CATHETER DIATEK RIGHT INTERNAL JUGULAR;  Surgeon: [name], MD;  Location: [facility] OR;  Service: Vascular;  Laterality: Right;     Current Outpatient Medications  Medication Sig Dispense Refill  . carvedilol (COREG) 12.5 MG tablet Take 1 tablet (12.5 mg total) by mouth 2 (two) times daily with a meal. 60 tablet 11  . insulin aspart protamine- aspart (NOVOLOG MIX 70/30) (70-30) 100 UNIT/ML injection Inject 0.08 mLs (8 Units total) into the skin 2 (two) times daily with a meal. 4.8 mL 0  . isosorbide-hydrALAZINE (BIDIL) 20-37.5 MG tablet Take 2 tablets by mouth 3 (three) times daily. 180 tablet 11  . RENVELA 0.8 g PACK packet Take 1 packet by mouth 3 times daily with meals  0   No current facility-administered medications for this visit.     Allergies:   Patient has no known allergies.     ROS:  Please see the history of present illness.  Otherwise, review of systems are positive for none.   All other systems are reviewed and negative.    PHYSICAL EXAM: VS:  There were no vitals taken for this visit. , BMI There is no height or weight on file to calculate BMI. GENERAL:  Well appearing NECK:  Positive jugular venous distention, waveform within normal limits, carotid upstroke brisk and symmetric, no bruits, no thyromegaly LUNGS:  Clear to auscultation bilaterally BACK:  No CVA tenderness CHEST:  Unremarkable HEART:  PMI not displaced or sustained,S1 and S2 within normal limits, no S3, no S4, no clicks, no rubs, no murmurs ABD:  Flat, positive bowel sounds normal in frequency in pitch, no bruits, no rebound, no guarding, no midline pulsatile mass, no hepatomegaly,  no splenomegaly EXT:  2 plus pulses throughout, moderate calf edema, no cyanosis no clubbing SKIN:  No rashes no nodules   EKG:  EKG is not ordered today.    Recent Labs: 11/26/2017: B Natriuretic Peptide 841.6 01/17/2018: ALT 10; BUN 52; Creatinine, Ser 5.73; Hemoglobin 9.9; Platelets 256; Potassium 3.9; Sodium 140    Lipid Panel No results found for: CHOL, TRIG, HDL, CHOLHDL, VLDL, LDLCALC, LDLDIRECT    Wt Readings from Last 3 Encounters:  02/21/18 186 lb (84.4 kg)  01/16/18 205 lb 14.6 oz (93.4 kg)  01/11/18 199 lb (90.3 kg)      Other studies Reviewed: Additional studies/ records that were reviewed today include: Labs. Review of the above records demonstrates:  Please see elsewhere in the note.     ASSESSMENT AND PLAN:   Chronic systolic congestive heart failure (HCC) Right and left heart cath was suggested.  ***  I am going to slowly titrate meds.  She had hypotension with symptoms in the hospital.  Today I will increase the Imdur to 30 mg.  Next we might go up on the hydralazine.  She has continued edema and I will increase her Lasix to 60 mg daily and she will get a BMET in 10 days.     ESRD ***  Hypertension ***  Insulin dependent diabetes mellitus with complications (HCC) ***  She had an appt to have this treated and is going to get a new primary MD.  When she returns we need to make sure that she has a primary MD.   Morbid obesity (HCC) ***  We talked about diet and exercise.    DM ***   Current medicines are reviewed at length with the patient today.  The patient does not have concerns regarding medicines.  The following changes have been made:  ***  Labs/ tests ordered today include: ***  No orders of the defined types were placed in this encounter.    Disposition:   FU with ***   Signed, Rollene Rotunda, MD  02/28/2018 9:45 PM    [facility]

## 2018-03-01 ENCOUNTER — Ambulatory Visit: Payer: Medicaid Other | Admitting: Cardiology

## 2018-03-04 ENCOUNTER — Encounter: Payer: Self-pay | Admitting: Cardiology

## 2018-03-05 ENCOUNTER — Encounter: Payer: Medicaid Other | Admitting: Vascular Surgery

## 2018-03-05 ENCOUNTER — Encounter (HOSPITAL_COMMUNITY): Payer: Medicaid Other

## 2018-03-19 ENCOUNTER — Telehealth (HOSPITAL_COMMUNITY): Payer: Self-pay

## 2018-03-20 ENCOUNTER — Telehealth (HOSPITAL_COMMUNITY): Payer: Self-pay

## 2018-03-20 NOTE — Telephone Encounter (Signed)
I called Lisa Hodges to schedule an appointment. She stated she would be available tomorrow morning and asked me to come then. We agreed to meet at 09:00.

## 2018-03-20 NOTE — Telephone Encounter (Signed)
I called Lisa Hodges to schedule an appointment Her phone did not ring but went directly to an automated message saying that the person I was trying to reach is not available. No option was given to leave a voicemail.  

## 2018-03-20 NOTE — Telephone Encounter (Signed)
I called Lisa Hodges to schedule an appointment Her phone did not ring but went directly to an automated message saying that the person I was trying to reach is not available. No option was given to leave a voicemail.   Note: I have been using the improper area code which has resulted in my inability to reach Lisa Molzahn. I will correct this and try to contacted her again.

## 2018-03-20 NOTE — Telephone Encounter (Signed)
I called Lisa Hodges to schedule an appointment Her phone did not ring but went directly to an automated message saying that the person I was trying to reach is not available. No option was given to leave a voicemail.

## 2018-03-21 ENCOUNTER — Telehealth (HOSPITAL_COMMUNITY): Payer: Self-pay | Admitting: Cardiology

## 2018-03-21 ENCOUNTER — Other Ambulatory Visit (HOSPITAL_COMMUNITY): Payer: Self-pay

## 2018-03-21 NOTE — Progress Notes (Signed)
Paramedicine Encounter    Patient ID: Lisa Hodges, female    DOB: 01-09-1986, 32 y.o.   MRN: [id number]   Patient Care Team: Leilani Able, MD as PCP - General (Family Medicine)  Patient Active Problem List   Diagnosis Date Noted  . ESRD on dialysis (HCC) 02/21/2018  . Microcytic anemia 11/26/2017  . Hypertensive urgency 11/26/2017  . Medication management 01/29/2017  . Morbid obesity (HCC) 11/16/2016  . 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 Chronic systolic heart failure (HCC) 10/26/2016  . Hypertension 10/26/2016  . Insulin dependent diabetes mellitus with complications (HCC) 10/26/2015    Current Outpatient Medications:  .  carvedilol (COREG) 12.5 MG tablet, Take 1 tablet (12.5 mg total) by mouth 2 (two) times daily with a meal., Disp: 60 tablet, Rfl: 11 .  insulin aspart protamine- aspart (NOVOLOG MIX 70/30) (70-30) 100 UNIT/ML injection, Inject 0.08 mLs (8 Units total) into the skin 2 (two) times daily with a meal., Disp: 4.8 mL, Rfl: 0 .  isosorbide-hydrALAZINE (BIDIL) 20-37.5 MG tablet, Take 2 tablets by mouth 3 (three) times daily., Disp: 180 tablet, Rfl: 11 .  RENVELA 0.8 g PACK packet, Take 1 packet by mouth 3 times daily with meals, Disp: , Rfl: 0 No Known Allergies    Social History   Socioeconomic History  . Marital status: Single    Spouse name: Not on file  . Number of children: Not on file  . Years of education: Not on file  . Highest education level: Not on file  Occupational History  . Not on file  Social Needs  . Financial resource strain: Not on file  . Food insecurity:    Worry: Not on file    Inability: Not on file  . Transportation needs:    Medical: Not on file    Non-medical: Not on file  Tobacco Use  . Smoking status: Never Smoker  . Smokeless tobacco: Never Used  Substance and Sexual Activity  . Alcohol use: No  . Drug use: No  . Sexual activity: Not Currently  Lifestyle  . Physical activity:    Days  per week: Not on file    Minutes per session: Not on file  . Stress: Not on file  Relationships  . Social connections:    Talks on phone: Not on file    Gets together: Not on file    Attends religious service: Not on file    Active member of club or organization: Not on file    Attends meetings of clubs or organizations: Not on file    Relationship status: Not on file  . Intimate partner violence:    Fear of current or ex partner: Not on file    Emotionally abused: Not on file    Physically abused: Not on file    Forced sexual activity: Not on file  Other Topics Concern  . Not on file  Social History Narrative  . Not on file    Physical Exam  Constitutional: She is oriented to person, place, and time.  Cardiovascular: Regular rhythm.  Pulmonary/Chest: Effort normal and breath sounds normal.  Abdominal: Soft. Bowel sounds are normal.  Musculoskeletal: Normal range of motion. She exhibits no edema.  Neurological: She is alert and oriented to person, place, and time.  Skin: Skin is warm and dry.  Psychiatric: She has a normal mood and affect.        Future Appointments  Date Time Provider Department Center  03/26/2018  3:00 PM [facility]  03/26/2018  3:45 PM Early, Kristen Loader, MD [facility]  06/20/2018  2:20 PM Laurey Morale, MD [facility] None    BP (!) 80/60 (BP Location: Left Arm, Patient Position: Sitting, Cuff Size: Large)   Pulse (!) 108   Resp 16   Wt 180 lb (81.6 kg)   SpO2 98%   BMI 38.95 kg/m   Weight yesterday- 179 lb Last visit weight- 186 lb  Lisa Hodges was seen at home today and reported feeling tired. She stated that she had been going to dialysis and had been having episodes of hypotension so the kidney center decreased her Bidil to 2 tablets, two times daily. She was found to be hypotensive today but denied having any dizziness. I contacted the clinic who advised to have her hold evening Bidil and carvedilol and consult the dialysis  center tomorrow about when to restart. Lisa Hodges was understanding and advised she would call me if she had any problems today.   Jacqualine Code, EMT 03/21/18  ACTION: Home visit completed Next visit planned for 1 week

## 2018-03-21 NOTE — Telephone Encounter (Signed)
Lisa Hodges, with [facility] called during patients home visit to report hypotension episodes SBP 78  Patient reports her bidil was decreased to BID by nephrology during her dialysis treatment 6/26 Already took AM meds today  Patient reports she does have increased fatigue   Above reviewed with Duwaine Maxin, NP Hold all afternoon medications and be sure to follow up with dialysis tomorrow for further evaluation   Zach aware

## 2018-03-25 ENCOUNTER — Telehealth (HOSPITAL_COMMUNITY): Payer: Self-pay

## 2018-03-25 NOTE — Telephone Encounter (Signed)
I called Lisa Hodges to schedule an appointment. She did not answer so I left a voicemail requesting she call me back.

## 2018-03-25 NOTE — Telephone Encounter (Signed)
Lisa Hodges returned my call and reported feeling well. She denied any dizziness and said she was not able to speak with the dialysis nurse on Friday because she was not there but Lisa Loleta Chance has stayed off her blood pressure medicine since I last saw her and has not had any dizzy spells or fatigue like she was having. She stated that she would be at dialysis today, Wednesday and Friday so she agreed to meet me tomorrow at 15:30. She is also going to speak to the dialysis nurse today regarding her blood pressure.

## 2018-03-26 ENCOUNTER — Other Ambulatory Visit: Payer: Self-pay | Admitting: *Deleted

## 2018-03-26 ENCOUNTER — Telehealth (HOSPITAL_COMMUNITY): Payer: Self-pay

## 2018-03-26 ENCOUNTER — Encounter: Payer: Self-pay | Admitting: *Deleted

## 2018-03-26 ENCOUNTER — Encounter: Payer: Self-pay | Admitting: Vascular Surgery

## 2018-03-26 ENCOUNTER — Ambulatory Visit (HOSPITAL_COMMUNITY)
Admission: RE | Admit: 2018-03-26 | Discharge: 2018-03-26 | Disposition: A | Discharge status: Home / Self Care | Payer: Medicaid Other | Source: Ambulatory Visit | Attending: Vascular Surgery | Admitting: Vascular Surgery

## 2018-03-26 ENCOUNTER — Ambulatory Visit (INDEPENDENT_AMBULATORY_CARE_PROVIDER_SITE_OTHER): Payer: Self-pay | Admitting: Vascular Surgery

## 2018-03-26 ENCOUNTER — Other Ambulatory Visit: Payer: Self-pay

## 2018-03-26 VITALS — BP 137/88 | Temp 97.6°F | Resp 18 | Ht <= 58 in | Wt 184.9 lb

## 2018-03-26 DIAGNOSIS — Z992 Dependence on renal dialysis: Secondary | ICD-10-CM

## 2018-03-26 DIAGNOSIS — Z48812 Encounter for surgical aftercare following surgery on the circulatory system: Secondary | ICD-10-CM | POA: Diagnosis not present

## 2018-03-26 DIAGNOSIS — N185 Chronic kidney disease, stage 5: Secondary | ICD-10-CM | POA: Diagnosis present

## 2018-03-26 DIAGNOSIS — N186 End stage renal disease: Secondary | ICD-10-CM

## 2018-03-26 NOTE — Progress Notes (Signed)
   Patient name: Lisa Hodges MRN: [id number] DOB: 05/16/1986 Sex: female  REASON FOR VISIT: Follow-up right brachiocephalic AV fistula  HPI: Lisa Hodges is a 32 y.o. female here today for follow-up.  She had a right brachiocephalic AV fistula creation by myself on 01/16/2018.  She currently has hemodialysis via tunneled catheter which is working well for her.  No steal symptoms.  Current Outpatient Medications  Medication Sig Dispense Refill  . carvedilol (COREG) 12.5 MG tablet Take 1 tablet (12.5 mg total) by mouth 2 (two) times daily with a meal. 60 tablet 11  . isosorbide-hydrALAZINE (BIDIL) 20-37.5 MG tablet Take 2 tablets by mouth 3 (three) times daily. (Patient taking differently: Take 2 tablets by mouth 2 (two) times daily. ) 180 tablet 11  . RENVELA 0.8 g PACK packet Take 1 packet by mouth 3 times daily with meals  0  . insulin aspart protamine- aspart (NOVOLOG MIX 70/30) (70-30) 100 UNIT/ML injection Inject 0.08 mLs (8 Units total) into the skin 2 (two) times daily with a meal. 4.8 mL 0   No current facility-administered medications for this visit.      PHYSICAL EXAM: Vitals:   03/26/18 1528  BP: 137/88  Resp: 18  Temp: 97.6 F (36.4 C)  TempSrc: Oral  SpO2: 99%  Weight: 184 lb 14.4 oz (83.9 kg)  Height: 4' 9" (1.448 m)    GENERAL: The patient is a well-nourished female, in no acute distress. The vital signs are documented above. Excellent thrill in her right upper arm brachiocephalic fistula.  The vein has a good diameter at the antecubital space.  It does run deep in her upper arm due to her obesity  Duplex shows good size maturation over 6 mm throughout the course.  The vein does run deep with several large branches  MEDICAL ISSUES: I discussed this with the patient.  Explained that her fistula will not be usable since it is running deeply the way it currently is located anatomically.  I have recommended superficial mobilization  and ligation of competing branches to make this usable for her.  Explained this is an outpatient procedure.  She has high hemodialysis on Monday Wednesday and Friday.  I am in the office on Tuesday and Thursday.  I did offer to alter her dialysis day off of the usual.  She reports this is a very difficult for her due to public transportation to the dialysis center and would prefer surgery on a nondialysis day with 1 of my partners.  We will can coordinate this at her convenience   Keaston Pile F. Nattie Lazenby, MD FACS [facility] Office Tel [phone] Pager [phone] 

## 2018-03-26 NOTE — Telephone Encounter (Signed)
Ms Doughty called me today to advise that she had another appointment come up and she would be unable to keep our scheduled appointment. She said that she would not be free until next Thursday so I told he I would call her next week to set up a time.

## 2018-03-26 NOTE — H&P (View-Only) (Signed)
   Patient name: Lisa Hodges MRN: [id number] DOB: 1986-02-28 Sex: female  REASON FOR VISIT: Follow-up right brachiocephalic AV fistula  HPI: Lisa Hodges is a 32 y.o. female here today for follow-up.  She had a right brachiocephalic AV fistula creation by myself on 01/16/2018.  She currently has hemodialysis via tunneled catheter which is working well for her.  No steal symptoms.  Current Outpatient Medications  Medication Sig Dispense Refill  . carvedilol (COREG) 12.5 MG tablet Take 1 tablet (12.5 mg total) by mouth 2 (two) times daily with a meal. 60 tablet 11  . isosorbide-hydrALAZINE (BIDIL) 20-37.5 MG tablet Take 2 tablets by mouth 3 (three) times daily. (Patient taking differently: Take 2 tablets by mouth 2 (two) times daily. ) 180 tablet 11  . RENVELA 0.8 g PACK packet Take 1 packet by mouth 3 times daily with meals  0  . insulin aspart protamine- aspart (NOVOLOG MIX 70/30) (70-30) 100 UNIT/ML injection Inject 0.08 mLs (8 Units total) into the skin 2 (two) times daily with a meal. 4.8 mL 0   No current facility-administered medications for this visit.      PHYSICAL EXAM: Vitals:   03/26/18 1528  BP: 137/88  Resp: 18  Temp: 97.6 F (36.4 C)  TempSrc: Oral  SpO2: 99%  Weight: 184 lb 14.4 oz (83.9 kg)  Height: 4\' 9"  (1.448 m)    GENERAL: The patient is a well-nourished female, in no acute distress. The vital signs are documented above. Excellent thrill in her right upper arm brachiocephalic fistula.  The vein has a good diameter at the antecubital space.  It does run deep in her upper arm due to her obesity  Duplex shows good size maturation over 6 mm throughout the course.  The vein does run deep with several large branches  MEDICAL ISSUES: I discussed this with the patient.  Explained that her fistula will not be usable since it is running deeply the way it currently is located anatomically.  I have recommended superficial mobilization  and ligation of competing branches to make this usable for her.  Explained this is an outpatient procedure.  She has high hemodialysis on Monday Wednesday and Friday.  I am in the office on Tuesday and Thursday.  I did offer to alter her dialysis day off of the usual.  She reports this is a very difficult for her due to public transportation to the dialysis center and would prefer surgery on a nondialysis day with 1 of my partners.  We will can coordinate this at her convenience   [name], MD [facility] Tel [phone] Pager [phone]

## 2018-03-31 IMAGING — US US RENAL
1 series · 14 of 25 positions shown · non-contrast
Comparison: 10/28/2016 renal sonogram.

CLINICAL DATA: 31-year-old diabetic hypertensive female with renal
failure today. Initial encounter.

EXAM:
RENAL / URINARY TRACT ULTRASOUND COMPLETE

[Series 1: us renal · 0.21mm/px · 14 of 29 slices shown]
[im 1/29]
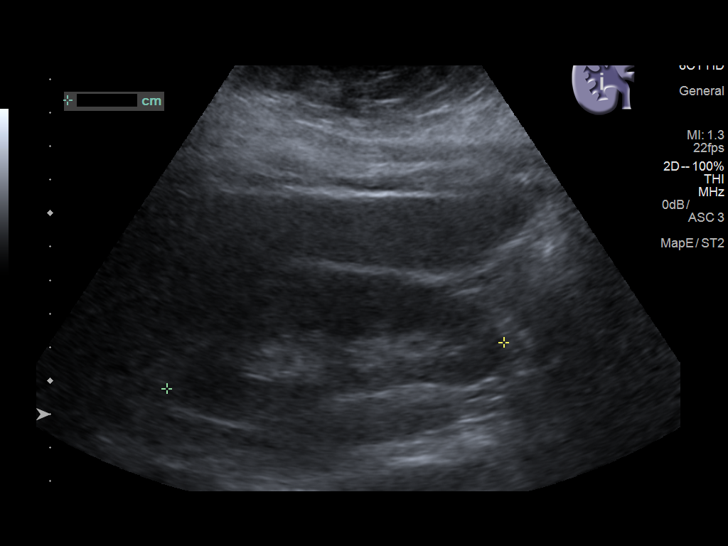
[im 3/29]
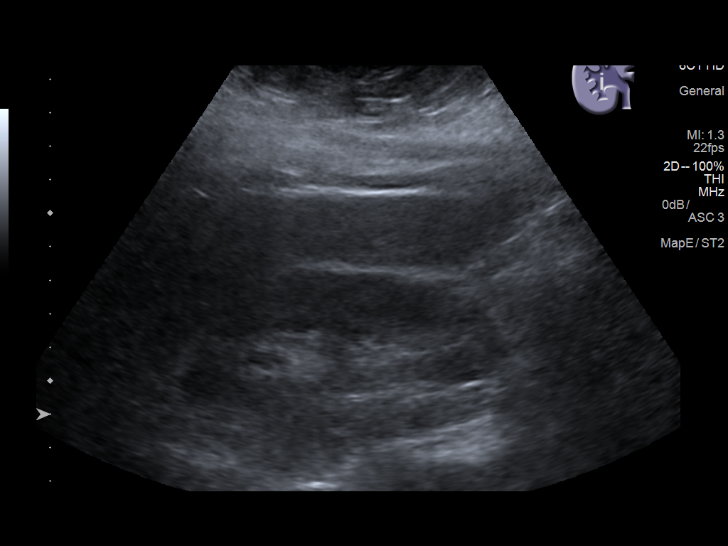
[im 5/29]
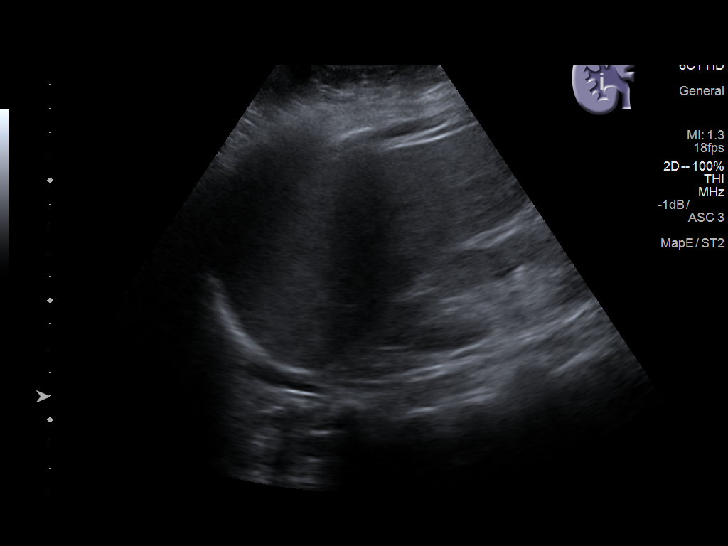
[im 8/29]
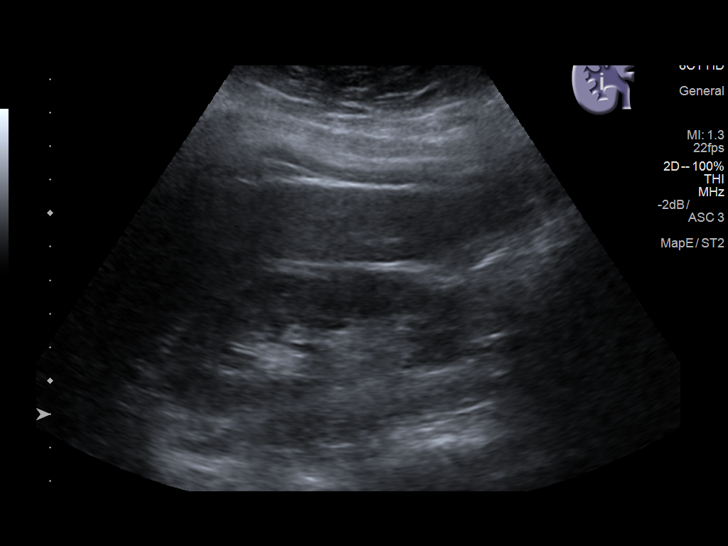
[im 10/29]
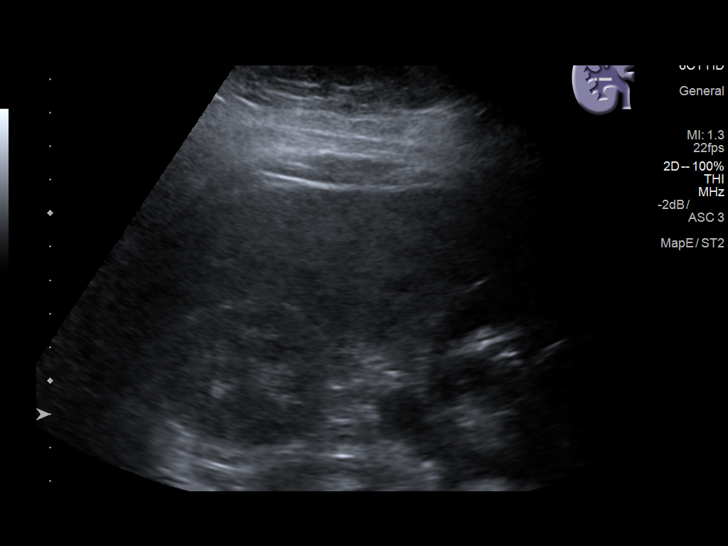
[im 11/29]
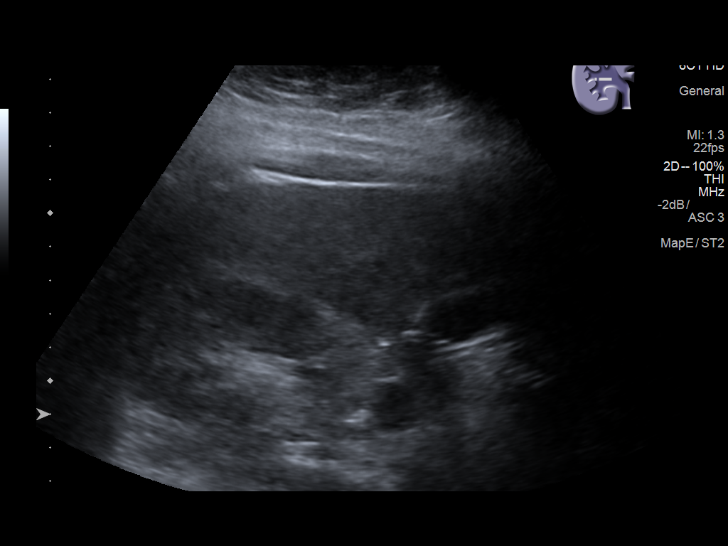
[im 13/29]
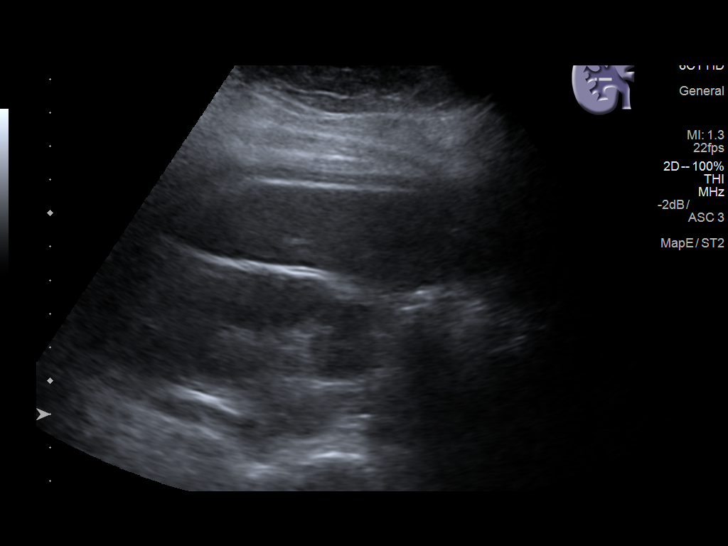
[im 16/29]
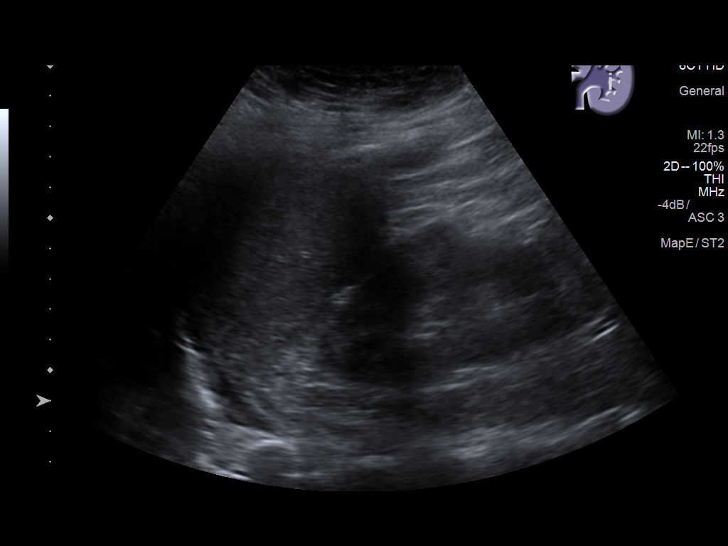
[im 18/29]
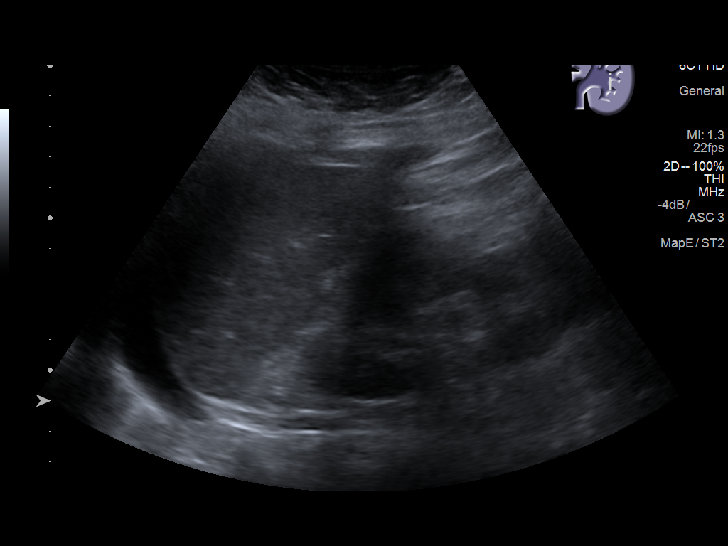
[im 19/29]
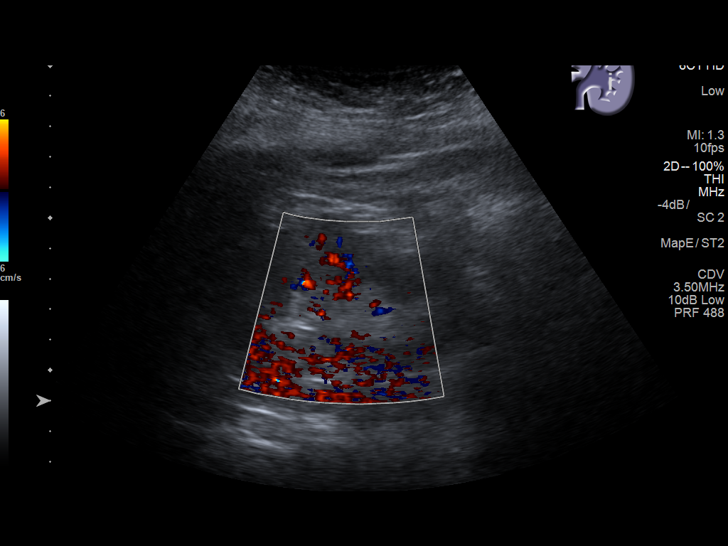
[im 22/29]
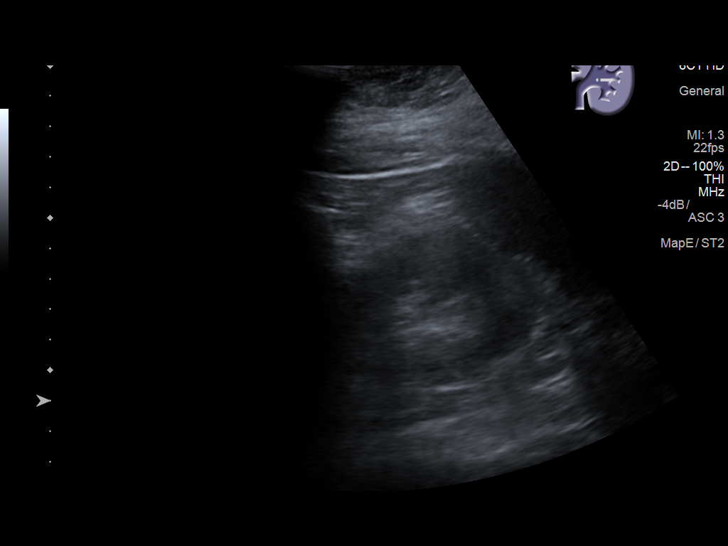
[im 24/29]
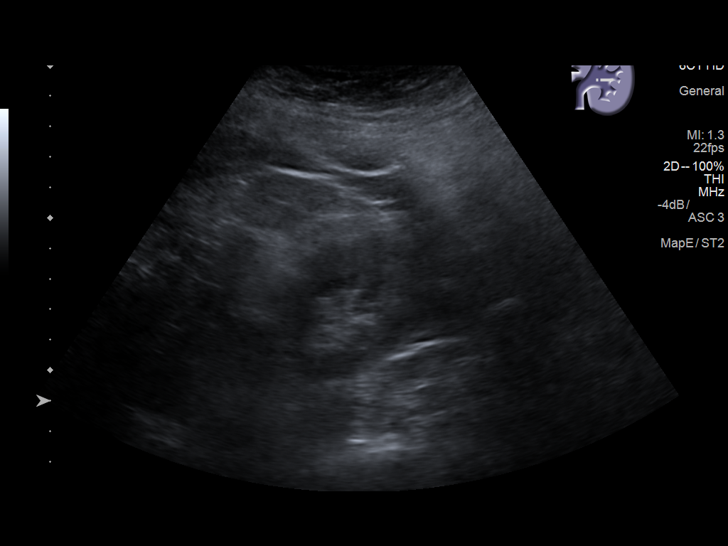
[im 26/29]
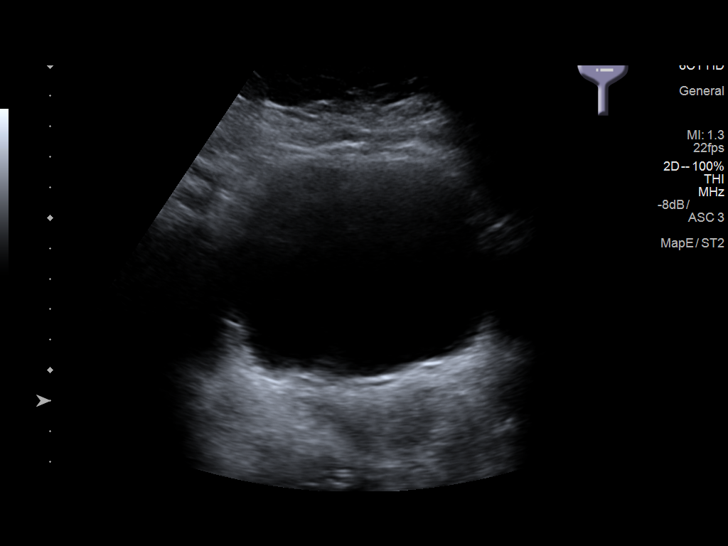
[im 29/29]
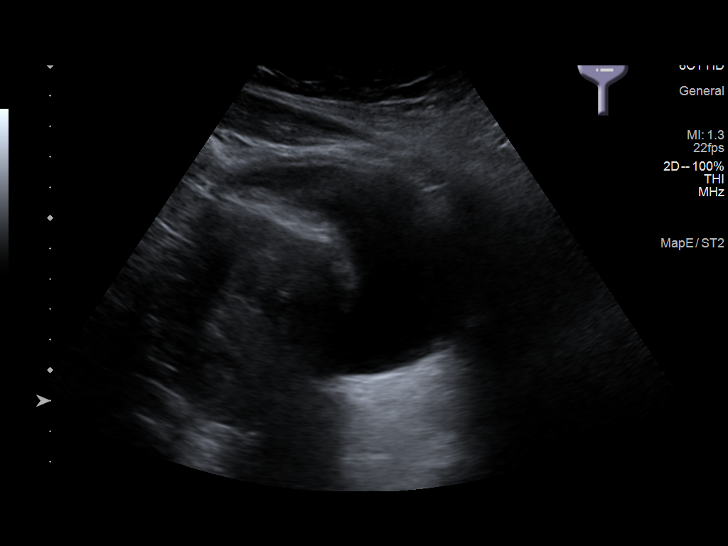

[14 of 25 positions shown; findings below may reference images not displayed]

FINDINGS: Right Kidney:

Length: 10.2 cm. Echogenicity within normal limits. No mass or
hydronephrosis visualized.

Left Kidney:

Length: 10.4 cm. Echogenicity within normal limits. No mass or
hydronephrosis visualized.

Bladder:

Appears normal for degree of bladder distention.

Small bilateral pleural effusions.

Uterine fibroids incompletely evaluated.
IMPRESSION: Negative renal sonogram.

Small bilateral pleural effusions.

Uterine fibroids incompletely assessed.

## 2018-04-04 ENCOUNTER — Other Ambulatory Visit (HOSPITAL_COMMUNITY): Payer: Self-pay

## 2018-04-04 ENCOUNTER — Telehealth (HOSPITAL_COMMUNITY): Payer: Self-pay

## 2018-04-04 NOTE — Progress Notes (Signed)
Paramedicine Encounter    Patient ID: Lisa Hodges, female    DOB: 1986-05-31, 32 y.o.   MRN: [id number]   Patient Care Team: Leilani Able, MD as PCP - General (Family Medicine) Center, [facility] Annie Sable, MD as Consulting Physician (Nephrology)  Patient Active Problem List   Diagnosis Date Noted  . ESRD on dialysis (HCC) 02/21/2018  . Microcytic anemia 11/26/2017  . Hypertensive urgency 11/26/2017  . Medication management 01/29/2017  . Morbid obesity (HCC) 11/16/2016  . 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 Chronic systolic heart failure (HCC) 10/26/2016  . Hypertension 10/26/2016  . Insulin dependent diabetes mellitus with complications (HCC) 10/26/2015    Current Outpatient Medications:  .  insulin aspart protamine- aspart (NOVOLOG MIX 70/30) (70-30) 100 UNIT/ML injection, Inject 0.08 mLs (8 Units total) into the skin 2 (two) times daily with a meal., Disp: 4.8 mL, Rfl: 0 .  RENVELA 0.8 g PACK packet, Take 1 packet by mouth 3 times daily with meals, Disp: , Rfl: 0 .  carvedilol (COREG) 12.5 MG tablet, Take 1 tablet (12.5 mg total) by mouth 2 (two) times daily with a meal. (Patient not taking: Reported on 04/04/2018), Disp: 60 tablet, Rfl: 11 .  isosorbide-hydrALAZINE (BIDIL) 20-37.5 MG tablet, Take 2 tablets by mouth 3 (three) times daily. (Patient not taking: Reported on 04/04/2018), Disp: 180 tablet, Rfl: 11 No Known Allergies    Social History   Socioeconomic History  . Marital status: Single    Spouse name: Not on file  . Number of children: Not on file  . Years of education: Not on file  . Highest education level: Not on file  Occupational History  . Not on file  Social Needs  . Financial resource strain: Not on file  . Food insecurity:    Worry: Not on file    Inability: Not on file  . Transportation needs:    Medical: Not on file    Non-medical: Not on file  Tobacco Use  . Smoking status: Never Smoker   . Smokeless tobacco: Never Used  Substance and Sexual Activity  . Alcohol use: No  . Drug use: No  . Sexual activity: Not Currently  Lifestyle  . Physical activity:    Days per week: Not on file    Minutes per session: Not on file  . Stress: Not on file  Relationships  . Social connections:    Talks on phone: Not on file    Gets together: Not on file    Attends religious service: Not on file    Active member of club or organization: Not on file    Attends meetings of clubs or organizations: Not on file    Relationship status: Not on file  . Intimate partner violence:    Fear of current or ex partner: Not on file    Emotionally abused: Not on file    Physically abused: Not on file    Forced sexual activity: Not on file  Other Topics Concern  . Not on file  Social History Narrative  . Not on file    Physical Exam  Constitutional: She is oriented to person, place, and time.  Cardiovascular: Normal rate and regular rhythm.  Pulmonary/Chest: Effort normal and breath sounds normal.  Abdominal: Soft.  Musculoskeletal: Normal range of motion. She exhibits no edema.  Neurological: She is alert and oriented to person, place, and time.  Skin: Skin is warm and dry.  Psychiatric: She has a normal  mood and affect.        Future Appointments  Date Time Provider Department Center  06/20/2018  2:20 PM Laurey Morale, MD [facility] None    BP 138/80 (BP Location: Left Arm, Patient Position: Sitting, Cuff Size: Large)   Pulse 92   Resp 16   Wt 180 lb (81.6 kg)   SpO2 98%   BMI 38.95 kg/m   Weight yesterday- 180 lb Last visit weight- 180 lb  Lisa Hodges was seen at home today and reported feeling well. She denied SOB, headache, dizziness or orhopnea. She has been going to dialysis regularly but has not asked the physician if she should be taking carvedilol or bidil, following her episode of hypotension. As a result she has not taken either since I saw her last but she reports  having blood pressures within normal limits. I asked she discuss this at dialysis tomorrow and she agreed. I spent most of our visit explaining heart failure and how dialysis is managing her blood pressure. I believe that it will be beneficial to Lisa Hodges to stay in the program since she has significant health problems and limited social resources.   Jacqualine Code, EMT 04/04/18  ACTION: Home visit completed Next visit planned for 1 week

## 2018-04-04 NOTE — Telephone Encounter (Signed)
I called Lisa Hodges to schedule an appointment. She did not answer so I left a voicemail requesting she call me back. 

## 2018-04-15 ENCOUNTER — Other Ambulatory Visit: Payer: Self-pay

## 2018-04-15 ENCOUNTER — Encounter (HOSPITAL_COMMUNITY): Payer: Self-pay | Admitting: *Deleted

## 2018-04-15 NOTE — Progress Notes (Signed)
Spoke with pt for pre-op call. Pt has hx of CHF, cardiologist is Dr. Antoine Poche. Pt denies any recent chest pain or sob. Pt is a type 2 diabetic. Last A1C was 7.6 on 11/26/17. Pt states her fasting blood sugar is usually between 170 - low 200's. Pt instructed to take 70% of her Novolog 70/30 insulin this evening (will take 6 units) and not to take it in the AM. Pt instructed to check her blood sugar when she gets up in the AM. If blood sugar is 70 or below, treat with 1/2 cup of clear juice (apple or cranberry) and recheck blood sugar 15 minutes after drinking juice. Pt voiced understanding.  Pt had not been taking her Carvedilol and Bidil because of low blood pressure, but she is now taking it on non-dialysis days.

## 2018-04-16 ENCOUNTER — Telehealth: Payer: Self-pay | Admitting: Vascular Surgery

## 2018-04-16 ENCOUNTER — Telehealth (HOSPITAL_COMMUNITY): Payer: Self-pay

## 2018-04-16 ENCOUNTER — Ambulatory Visit (HOSPITAL_COMMUNITY): Payer: Medicaid Other | Admitting: Certified Registered Nurse Anesthetist

## 2018-04-16 ENCOUNTER — Ambulatory Visit (HOSPITAL_COMMUNITY)
Admission: RE | Admit: 2018-04-16 | Discharge: 2018-04-16 | Disposition: A | Discharge status: Home / Self Care | Payer: Medicaid Other | Source: Ambulatory Visit | Attending: Vascular Surgery | Admitting: Vascular Surgery

## 2018-04-16 ENCOUNTER — Encounter (HOSPITAL_COMMUNITY): Payer: Self-pay | Admitting: *Deleted

## 2018-04-16 ENCOUNTER — Encounter (HOSPITAL_COMMUNITY)
Admission: RE | Disposition: A | Discharge status: Home / Self Care | Payer: Self-pay | Source: Ambulatory Visit | Attending: Vascular Surgery

## 2018-04-16 DIAGNOSIS — I429 Cardiomyopathy, unspecified: Secondary | ICD-10-CM | POA: Insufficient documentation

## 2018-04-16 DIAGNOSIS — N186 End stage renal disease: Secondary | ICD-10-CM | POA: Insufficient documentation

## 2018-04-16 DIAGNOSIS — Z6839 Body mass index (BMI) 39.0-39.9, adult: Secondary | ICD-10-CM | POA: Diagnosis not present

## 2018-04-16 DIAGNOSIS — E1022 Type 1 diabetes mellitus with diabetic chronic kidney disease: Secondary | ICD-10-CM | POA: Diagnosis not present

## 2018-04-16 DIAGNOSIS — I5022 Chronic systolic (congestive) heart failure: Secondary | ICD-10-CM | POA: Diagnosis not present

## 2018-04-16 DIAGNOSIS — T82898A Other specified complication of vascular prosthetic devices, implants and grafts, initial encounter: Secondary | ICD-10-CM | POA: Insufficient documentation

## 2018-04-16 DIAGNOSIS — I132 Hypertensive heart and chronic kidney disease with heart failure and with stage 5 chronic kidney disease, or end stage renal disease: Secondary | ICD-10-CM | POA: Insufficient documentation

## 2018-04-16 DIAGNOSIS — Z9889 Other specified postprocedural states: Secondary | ICD-10-CM | POA: Insufficient documentation

## 2018-04-16 DIAGNOSIS — Y832 Surgical operation with anastomosis, bypass or graft as the cause of abnormal reaction of the patient, or of later complication, without mention of misadventure at the time of the procedure: Secondary | ICD-10-CM | POA: Diagnosis not present

## 2018-04-16 DIAGNOSIS — Z794 Long term (current) use of insulin: Secondary | ICD-10-CM | POA: Insufficient documentation

## 2018-04-16 DIAGNOSIS — Z992 Dependence on renal dialysis: Secondary | ICD-10-CM | POA: Insufficient documentation

## 2018-04-16 DIAGNOSIS — N185 Chronic kidney disease, stage 5: Secondary | ICD-10-CM

## 2018-04-16 DIAGNOSIS — E669 Obesity, unspecified: Secondary | ICD-10-CM | POA: Diagnosis not present

## 2018-04-16 DIAGNOSIS — Z79899 Other long term (current) drug therapy: Secondary | ICD-10-CM | POA: Diagnosis not present

## 2018-04-16 HISTORY — PX: FISTULA SUPERFICIALIZATION: SHX6341

## 2018-04-16 HISTORY — DX: Pneumonia, unspecified organism: J18.9

## 2018-04-16 HISTORY — DX: Anemia, unspecified: D64.9

## 2018-04-16 LAB — HCG, SERUM, QUALITATIVE: PREG SERUM: NEGATIVE

## 2018-04-16 LAB — POCT I-STAT 4, (NA,K, GLUC, HGB,HCT)
Glucose, Bld: 159 mg/dL — ABNORMAL HIGH (ref 70–99)
HEMATOCRIT: 43 % (ref 36.0–46.0)
Hemoglobin: 14.6 g/dL (ref 12.0–15.0)
Potassium: 4.5 mmol/L (ref 3.5–5.1)
Sodium: 135 mmol/L (ref 135–145)

## 2018-04-16 LAB — HEMOGLOBIN A1C
HEMOGLOBIN A1C: 5.9 % — AB (ref 4.8–5.6)
MEAN PLASMA GLUCOSE: 122.63 mg/dL

## 2018-04-16 LAB — TYPE AND SCREEN
ABO/RH(D): AB POS
ANTIBODY SCREEN: NEGATIVE

## 2018-04-16 LAB — GLUCOSE, CAPILLARY
GLUCOSE-CAPILLARY: 160 mg/dL — AB (ref 70–99)
Glucose-Capillary: 144 mg/dL — ABNORMAL HIGH (ref 70–99)

## 2018-04-16 LAB — ABO/RH: ABO/RH(D): AB POS

## 2018-04-16 SURGERY — FISTULA SUPERFICIALIZATION
Anesthesia: Monitor Anesthesia Care | Site: Arm Upper | Laterality: Right

## 2018-04-16 MED ORDER — PROPOFOL 500 MG/50ML IV EMUL
INTRAVENOUS | Status: DC | PRN
  Administered 2018-04-16: 25 ug/kg/min via INTRAVENOUS

## 2018-04-16 MED ORDER — FENTANYL CITRATE (PF) 100 MCG/2ML IJ SOLN
INTRAMUSCULAR | Status: DC | PRN
  Administered 2018-04-16 (×3): 12.5 ug via INTRAVENOUS
  Administered 2018-04-16: 25 ug via INTRAVENOUS
  Administered 2018-04-16 (×3): 12.5 ug via INTRAVENOUS

## 2018-04-16 MED ORDER — PROPOFOL 1000 MG/100ML IV EMUL
INTRAVENOUS | Status: AC
  Filled 2018-04-16: qty 100

## 2018-04-16 MED ORDER — FENTANYL CITRATE (PF) 250 MCG/5ML IJ SOLN
INTRAMUSCULAR | Status: AC
  Filled 2018-04-16: qty 5

## 2018-04-16 MED ORDER — ONDANSETRON HCL 4 MG/2ML IJ SOLN
INTRAMUSCULAR | Status: AC
  Filled 2018-04-16: qty 2

## 2018-04-16 MED ORDER — CHLORHEXIDINE GLUCONATE 4 % EX LIQD: 60.0000 mL | Freq: Once | CUTANEOUS | Status: DC

## 2018-04-16 MED ORDER — SODIUM CHLORIDE 0.9 % IV SOLN
INTRAVENOUS | Status: DC
  Administered 2018-04-16: 07:00:00 via INTRAVENOUS

## 2018-04-16 MED ORDER — LIDOCAINE 2% (20 MG/ML) 5 ML SYRINGE
INTRAMUSCULAR | Status: AC
  Filled 2018-04-16: qty 5

## 2018-04-16 MED ORDER — MIDAZOLAM HCL 2 MG/2ML IJ SOLN
INTRAMUSCULAR | Status: AC
  Filled 2018-04-16: qty 2

## 2018-04-16 MED ORDER — 0.9 % SODIUM CHLORIDE (POUR BTL) OPTIME
TOPICAL | Status: DC | PRN
  Administered 2018-04-16: 1000 mL

## 2018-04-16 MED ORDER — DEXAMETHASONE SODIUM PHOSPHATE 10 MG/ML IJ SOLN
INTRAMUSCULAR | Status: AC
  Filled 2018-04-16: qty 1

## 2018-04-16 MED ORDER — CEFAZOLIN SODIUM-DEXTROSE 2-4 GM/100ML-% IV SOLN
2.0000 g | INTRAVENOUS | Status: AC
  Administered 2018-04-16: 2 g via INTRAVENOUS
  Filled 2018-04-16: qty 100

## 2018-04-16 MED ORDER — SODIUM CHLORIDE 0.9 % IV SOLN
INTRAVENOUS | Status: AC
  Filled 2018-04-16: qty 1.2

## 2018-04-16 MED ORDER — LIDOCAINE HCL (PF) 1 % IJ SOLN
INTRAMUSCULAR | Status: DC | PRN
  Administered 2018-04-16: 30 mL

## 2018-04-16 MED ORDER — PHENYLEPHRINE 40 MCG/ML (10ML) SYRINGE FOR IV PUSH (FOR BLOOD PRESSURE SUPPORT)
PREFILLED_SYRINGE | INTRAVENOUS | Status: AC
  Filled 2018-04-16: qty 10

## 2018-04-16 MED ORDER — PHENYLEPHRINE HCL 10 MG/ML IJ SOLN: INTRAMUSCULAR | Status: DC | PRN

## 2018-04-16 MED ORDER — ONDANSETRON HCL 4 MG/2ML IJ SOLN
INTRAMUSCULAR | Status: DC | PRN
  Administered 2018-04-16: 4 mg via INTRAVENOUS

## 2018-04-16 MED ORDER — HYDROCODONE-ACETAMINOPHEN 5-325 MG PO TABS
1.0000 | ORAL_TABLET | Freq: Four times a day (QID) | ORAL | Status: DC | PRN
  Administered 2018-04-16: 1 via ORAL

## 2018-04-16 MED ORDER — PHENYLEPHRINE 40 MCG/ML (10ML) SYRINGE FOR IV PUSH (FOR BLOOD PRESSURE SUPPORT)
PREFILLED_SYRINGE | INTRAVENOUS | Status: DC | PRN
  Administered 2018-04-16 (×3): 40 ug via INTRAVENOUS

## 2018-04-16 MED ORDER — MIDAZOLAM HCL 2 MG/2ML IJ SOLN
INTRAMUSCULAR | Status: DC | PRN
  Administered 2018-04-16: 2 mg via INTRAVENOUS

## 2018-04-16 MED ORDER — HYDROCODONE-ACETAMINOPHEN 5-325 MG PO TABS: 1.0000 | ORAL_TABLET | Freq: Four times a day (QID) | ORAL | 0 refills | Status: DC | PRN

## 2018-04-16 MED ORDER — LIDOCAINE HCL (PF) 1 % IJ SOLN
INTRAMUSCULAR | Status: AC
  Filled 2018-04-16: qty 30

## 2018-04-16 MED ORDER — HYDROCODONE-ACETAMINOPHEN 5-325 MG PO TABS
ORAL_TABLET | ORAL | Status: AC
  Filled 2018-04-16: qty 1

## 2018-04-16 MED ORDER — SODIUM CHLORIDE 0.9 % IV SOLN
INTRAVENOUS | Status: DC | PRN
  Administered 2018-04-16: 500 mL

## 2018-04-16 MED ORDER — GLYCOPYRROLATE PF 0.2 MG/ML IJ SOSY
PREFILLED_SYRINGE | INTRAMUSCULAR | Status: AC
  Filled 2018-04-16: qty 1

## 2018-04-16 MED ORDER — PROPOFOL 10 MG/ML IV BOLUS
INTRAVENOUS | Status: DC | PRN
  Administered 2018-04-16: 30 mg via INTRAVENOUS

## 2018-04-16 SURGICAL SUPPLY — 40 items
ARMBAND PINK RESTRICT EXTREMIT (MISCELLANEOUS) ×3 IMPLANT
CANISTER SUCT 3000ML PPV (MISCELLANEOUS) ×3 IMPLANT
CLIP VESOCCLUDE MED 6/CT (CLIP) ×3 IMPLANT
CLIP VESOCCLUDE SM WIDE 6/CT (CLIP) ×3 IMPLANT
COVER PROBE W GEL 5X96 (DRAPES) IMPLANT
DECANTER SPIKE VIAL GLASS SM (MISCELLANEOUS) ×3 IMPLANT
DERMABOND ADHESIVE PROPEN (GAUZE/BANDAGES/DRESSINGS) ×2
DERMABOND ADVANCED (GAUZE/BANDAGES/DRESSINGS) ×2
DERMABOND ADVANCED .7 DNX12 (GAUZE/BANDAGES/DRESSINGS) ×1 IMPLANT
DERMABOND ADVANCED .7 DNX6 (GAUZE/BANDAGES/DRESSINGS) ×1 IMPLANT
DRAIN PENROSE 1/2X12 LTX STRL (WOUND CARE) IMPLANT
ELECT CAUTERY BLADE 6.4 (BLADE) ×3 IMPLANT
ELECT REM PT RETURN 9FT ADLT (ELECTROSURGICAL) ×3
ELECTRODE REM PT RTRN 9FT ADLT (ELECTROSURGICAL) ×1 IMPLANT
GLOVE BIO SURGEON STRL SZ 6.5 (GLOVE) ×8 IMPLANT
GLOVE BIO SURGEON STRL SZ7 (GLOVE) ×3 IMPLANT
GLOVE BIO SURGEON STRL SZ8 (GLOVE) ×3 IMPLANT
GLOVE BIO SURGEONS STRL SZ 6.5 (GLOVE) ×4
GLOVE BIOGEL PI IND STRL 6.5 (GLOVE) ×1 IMPLANT
GLOVE BIOGEL PI IND STRL 7.5 (GLOVE) ×1 IMPLANT
GLOVE BIOGEL PI INDICATOR 6.5 (GLOVE) ×2
GLOVE BIOGEL PI INDICATOR 7.5 (GLOVE) ×2
GOWN STRL REUS W/ TWL LRG LVL3 (GOWN DISPOSABLE) ×4 IMPLANT
GOWN STRL REUS W/ TWL XL LVL3 (GOWN DISPOSABLE) ×1 IMPLANT
GOWN STRL REUS W/TWL LRG LVL3 (GOWN DISPOSABLE) ×8
GOWN STRL REUS W/TWL XL LVL3 (GOWN DISPOSABLE) ×2
HEMOSTAT SPONGE AVITENE ULTRA (HEMOSTASIS) IMPLANT
KIT BASIN OR (CUSTOM PROCEDURE TRAY) ×3 IMPLANT
KIT TURNOVER KIT B (KITS) ×3 IMPLANT
NS IRRIG 1000ML POUR BTL (IV SOLUTION) ×3 IMPLANT
PACK CV ACCESS (CUSTOM PROCEDURE TRAY) ×3 IMPLANT
PAD ARMBOARD 7.5X6 YLW CONV (MISCELLANEOUS) ×6 IMPLANT
SUT MNCRL AB 4-0 PS2 18 (SUTURE) ×3 IMPLANT
SUT PROLENE 6 0 BV (SUTURE) IMPLANT
SUT PROLENE 7 0 BV 1 (SUTURE) ×3 IMPLANT
SUT VIC AB 3-0 SH 27 (SUTURE) ×8
SUT VIC AB 3-0 SH 27X BRD (SUTURE) ×4 IMPLANT
TOWEL GREEN STERILE (TOWEL DISPOSABLE) ×3 IMPLANT
UNDERPAD 30X30 (UNDERPADS AND DIAPERS) ×3 IMPLANT
WATER STERILE IRR 1000ML POUR (IV SOLUTION) ×3 IMPLANT

## 2018-04-16 NOTE — Anesthesia Preprocedure Evaluation (Addendum)
Anesthesia Evaluation  Patient identified by MRN, date of birth, ID band Patient awake    Reviewed: Allergy & Precautions, NPO status , Patient's Chart, lab work & pertinent test results  History of Anesthesia Complications (+) PONV  Airway Mallampati: III  TM Distance: >3 FB Neck ROM: Full    Dental  (+) Teeth Intact, Dental Advisory Given,    Pulmonary    Pulmonary exam normal        Cardiovascular hypertension, Pt. on medications Normal cardiovascular exam     Neuro/Psych Depression    GI/Hepatic   Endo/Other  diabetes, Type 1, Insulin Dependent  Renal/GU ESRF and DialysisRenal disease     Musculoskeletal   Abdominal   Peds  Hematology   Anesthesia Other Findings   Reproductive/Obstetrics                           Anesthesia Physical Anesthesia Plan  ASA: III  Anesthesia Plan: MAC   Post-op Pain Management:    Induction: Intravenous  PONV Risk Score and Plan: 3 and Ondansetron  Airway Management Planned: Simple Face Mask  Additional Equipment:   Intra-op Plan:   Post-operative Plan:   Informed Consent: I have reviewed the patients History and Physical, chart, labs and discussed the procedure including the risks, benefits and alternatives for the proposed anesthesia with the patient or authorized representative who has indicated his/her understanding and acceptance.     Plan Discussed with: CRNA and Surgeon  Anesthesia Plan Comments:         Anesthesia Quick Evaluation

## 2018-04-16 NOTE — Transfer of Care (Signed)
Immediate Anesthesia Transfer of Care Note  Patient: Lisa Hodges  Procedure(s) Performed: FISTULA SUPERFICIALIZATION ARTERIOVENOUS FISTULA RIGHT ARM (Right Arm Upper)  Patient Location: PACU  Anesthesia Type:MAC  Level of Consciousness: awake and drowsy  Airway & Oxygen Therapy: Patient Spontanous Breathing  Post-op Assessment: Report given to RN and Patient moving all extremities X 4  Post vital signs: Reviewed and stable  Last Vitals:  Vitals Value Taken Time  BP    Temp    Pulse 82 04/16/2018  8:59 AM  Resp 13 04/16/2018  8:59 AM  SpO2 100 % 04/16/2018  8:59 AM  Vitals shown include unvalidated device data.  Last Pain:  Vitals:   04/16/18 2992  TempSrc: Oral  PainSc: 0-No pain      Patients Stated Pain Goal: 8 (42/68/34 1962)  Complications: No apparent anesthesia complications

## 2018-04-16 NOTE — Anesthesia Postprocedure Evaluation (Signed)
Anesthesia Post Note  Patient: Fernley  Procedure(s) Performed: FISTULA SUPERFICIALIZATION ARTERIOVENOUS FISTULA RIGHT ARM (Right Arm Upper)     Patient location during evaluation: PACU Anesthesia Type: MAC Level of consciousness: awake and alert Pain management: pain level controlled Vital Signs Assessment: post-procedure vital signs reviewed and stable Respiratory status: spontaneous breathing, nonlabored ventilation, respiratory function stable and patient connected to nasal cannula oxygen Cardiovascular status: stable and blood pressure returned to baseline Postop Assessment: no apparent nausea or vomiting Anesthetic complications: no    Last Vitals:  Vitals:   04/16/18 1045 04/16/18 1100  BP: (!) 166/91 (!) 163/89  Pulse: 93 88  Resp: 20 14  Temp:  36.7 C  SpO2: 100% 100%    Last Pain:  Vitals:   04/16/18 1015  TempSrc:   PainSc: 6                  Shemeca Lukasik DAVID

## 2018-04-16 NOTE — Telephone Encounter (Signed)
sch appt phone NA mld ltr 05/16/18 3pm p/o PA

## 2018-04-16 NOTE — Telephone Encounter (Signed)
I called Ms Hilley to see how she is doing and schedule a visit. She did not answer and there was no option for a voicemail to be left. I will try again later this week.

## 2018-04-16 NOTE — Op Note (Signed)
    OPERATIVE NOTE   PROCEDURE: 1. Right brachiocephalic arteriovenous fistula superficialization 2. Side branch ligation x 5  PRE-OPERATIVE DIAGNOSIS: Deep arteriovenous fistula   POST-OPERATIVE DIAGNOSIS: same as above   SURGEON: Adele Barthel, MD  ASSISTANT(S): Dagoberto Ligas, PAC   ANESTHESIA: local and MAC  ESTIMATED BLOOD LOSS: 30 cc  FINDING(S): 1. Deep fistula (>6 mm throughout) with 3 hemodynamically significant side branches 2. Most of fistula 6 mm after side branch ligation and hydrodistension, distal fistula tapers down to 4 mm with evidence of some external scarring  SPECIMEN(S):  none  INDICATIONS:   Lisa Hodges is a 32 y.o. female who presents with mature arteriovenous fistula that is > 6 mm deep.  It was felt that without superficialization of the arteriovenous fistula it could not be used for hemodialysis.  Risk, benefits, and alternatives to access surgery were discussed.  The patient is aware the risks include but are not limited to: bleeding, infection, steal syndrome, nerve damage, ischemic monomelic neuropathy, thrombosis of the access, need for additional procedures, death and stroke.  The patient agrees to proceed forward with the procedure.   DESCRIPTION: After obtain full informed written consent, the patient was brought back to the operating room and placed supine upon the operating table.  The patient received IV antibiotics prior to induction.  After obtaining adequate anesthesia, the patient was prepped and draped in the standard fashion for: right arm access.  I turned my attention to the antecubitum, where I could feel the arterial anastomosis.  Using the Sonosite, I marked out the pathway of the cephalic vein all the way up to the axilla.  I made an incision from the arterial anastomosis up to the axillary extent of the vein.  Using electrocautery and blunt dissection, I developed a plane down the vein.  I tied off side branches with silk ties prior  to transecting them.  There was 3 significant side branches that were siphoning flow, as tying of each augment flow.  In such fashion, I fully mobilized the vein from 3 cm proximal to the arterial anastomosis up to its axillary extent.  The vein was noted to be 4-6 mm in diameter externally.  After side branch ligation and hydrodistension, most of the fistula was 6 mm.  Using the electrocautery, a shallow trough (6 mm deep) was developed in the lateral subcutaneous tissue in the incision.  The vein was transposed into the trough and secured with interrupted stitches of 3-0 Vicryl.  The subcutaneous tissue was reapproximated with a running stitch of 3-0 Vicryl.  The skin was reapproximated with a running subcuticular of 4-0 Monocryl.  The skin was cleaned, dried, and reinforced with Dermabond.   COMPLICATIONS: none  CONDITION: stable   Adele Barthel, MD, [facility] [place] Office: [phone] Pager: [phone]  04/16/2018, 8:38 AM

## 2018-04-16 NOTE — Discharge Instructions (Signed)
° °  [facility] [place] ° °Discharge Instructions ° °AV Fistula or Graft Surgery for Dialysis Access ° °Please refer to the following instructions for your post-procedure care. Your surgeon or physician assistant will discuss any changes with you. ° °Activity ° °You may drive the day following your surgery, if you are comfortable and no longer taking prescription pain medication. Resume full activity as the soreness in your incision resolves. ° °Bathing/Showering ° °You may shower after you go home. Keep your incision dry for 48 hours. Do not soak in a bathtub, hot tub, or swim until the incision heals completely. You may not shower if you have a hemodialysis catheter. ° °Incision Care ° °Clean your incision with mild soap and water after 48 hours. Pat the area dry with a clean towel. You do not need a bandage unless otherwise instructed. Do not apply any ointments or creams to your incision. You may have skin glue on your incision. Do not peel it off. It will come off on its own in about one week. Your arm may swell a bit after surgery. To reduce swelling use pillows to elevate your arm so it is above your heart. Your doctor will tell you if you need to lightly wrap your arm with an ACE bandage. ° °Diet ° °Resume your normal diet. There are not special food restrictions following this procedure. In order to heal from your surgery, it is CRITICAL to get adequate nutrition. Your body requires vitamins, minerals, and protein. Vegetables are the best source of vitamins and minerals. Vegetables also provide the perfect balance of protein. Processed food has little nutritional value, so try to avoid this. ° °Medications ° °Resume taking all of your medications. If your incision is causing pain, you may take over-the counter pain relievers such as acetaminophen (Tylenol). If you were prescribed a stronger pain medication, please be aware these medications can cause nausea and constipation. Prevent  nausea by taking the medication with a snack or meal. Avoid constipation by drinking plenty of fluids and eating foods with high amount of fiber, such as fruits, vegetables, and grains. Do not take Tylenol if you are taking prescription pain medications. ° ° ° ° °Follow up °Your surgeon may want to see you in the office following your access surgery. If so, this will be arranged at the time of your surgery. ° °Please call us immediately for any of the following conditions: ° °Increased pain, redness, drainage (pus) from your incision site °Fever of 101 degrees or higher °Severe or worsening pain at your incision site °Hand pain or numbness. ° °Reduce your risk of vascular disease: ° °Stop smoking. If you would like help, call [facility] at 1-800-QUIT-NOW (1-800-784-8669) or [place] at 336-586-4000 ° °Manage your cholesterol °Maintain a desired weight °Control your diabetes °Keep your blood pressure down ° °Dialysis ° °It will take several weeks to several months for your new dialysis access to be ready for use. Your surgeon will determine when it is OK to use it. Your nephrologist will continue to direct your dialysis. You can continue to use your Permcath until your new access is ready for use. ° °If you have any questions, please call the office at [phone]. ° °

## 2018-04-16 NOTE — Interval H&P Note (Signed)
History and Physical Interval Note:  04/16/2018 7:06 AM  Lisa Hodges  has presented today for surgery, with the diagnosis of COMPLICATION WITH FISTULA RIGHT UPPER EXTREMITY  The various methods of treatment have been discussed with the patient and family. After consideration of risks, benefits and other options for treatment, the patient has consented to  Procedure(s): FISTULA SUPERFICIALIZATION ARTERIOVENOUS FISTULA RIGHT ARM (Right) as a surgical intervention .  The patient's history has been reviewed, patient examined, no change in status, stable for surgery.  I have reviewed the patient's chart and labs.  Questions were answered to the patient's satisfaction.     Leonides Sake

## 2018-04-17 ENCOUNTER — Encounter (HOSPITAL_COMMUNITY): Payer: Self-pay | Admitting: Vascular Surgery

## 2018-04-25 ENCOUNTER — Telehealth (HOSPITAL_COMMUNITY): Payer: Self-pay

## 2018-04-25 NOTE — Telephone Encounter (Signed)
I called Lisa Hodges to schedule an appointment. She stated she was busy paying bills today and has dialysis tomorrow. She asked if I would be able to come by next week. We agreed on Tuesday morning at 09:00.

## 2018-04-30 ENCOUNTER — Other Ambulatory Visit (HOSPITAL_COMMUNITY): Payer: Self-pay

## 2018-04-30 NOTE — Progress Notes (Signed)
Paramedicine Encounter    Patient ID: Lisa Hodges, female    DOB: 06-Aug-1986, 32 y.o.   MRN: [id number]   Patient Care Team: Lisa Able, MD as PCP - General (Family Medicine) Center, [facility] Lisa Sable, MD as Consulting Physician (Nephrology)  Patient Active Problem List   Diagnosis Date Noted  . ESRD on dialysis (HCC) 02/21/2018  . Microcytic anemia 11/26/2017  . Hypertensive urgency 11/26/2017  . Medication management 01/29/2017  . Morbid obesity (HCC) 11/16/2016  . 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 Chronic systolic heart failure (HCC) 10/26/2016  . Hypertension 10/26/2016  . Insulin dependent diabetes mellitus with complications (HCC) 10/26/2015    Current Outpatient Medications:  .  b complex-vitamin c-folic acid (NEPHRO-VITE) 0.8 MG TABS tablet, Take 1 tablet by mouth at bedtime., Disp: , Rfl:  .  insulin aspart protamine- aspart (NOVOLOG MIX 70/30) (70-30) 100 UNIT/ML injection, Inject 0.08 mLs (8 Units total) into the skin 2 (two) times daily with a meal., Disp: 4.8 mL, Rfl: 0 .  RENVELA 0.8 g PACK packet, Take 1 packet by mouth 3 times daily with meals, Disp: , Rfl: 0 .  carvedilol (COREG) 12.5 MG tablet, Take 1 tablet (12.5 mg total) by mouth 2 (two) times daily with a meal. (Patient not taking: Reported on 04/30/2018), Disp: 60 tablet, Rfl: 11 .  HYDROcodone-acetaminophen (NORCO) 5-325 MG tablet, Take 1 tablet by mouth every 6 (six) hours as needed for moderate pain. (Patient not taking: Reported on 04/30/2018), Disp: 12 tablet, Rfl: 0 .  isosorbide-hydrALAZINE (BIDIL) 20-37.5 MG tablet, Take 2 tablets by mouth 3 (three) times daily. (Patient not taking: Reported on 04/30/2018), Disp: 180 tablet, Rfl: 11 No Known Allergies    Social History   Socioeconomic History  . Marital status: Single    Spouse name: Not on file  . Number of children: Not on file  . Years of education: Not on file  . Highest education  level: Not on file  Occupational History  . Not on file  Social Needs  . Financial resource strain: Not on file  . Food insecurity:    Worry: Not on file    Inability: Not on file  . Transportation needs:    Medical: Not on file    Non-medical: Not on file  Tobacco Use  . Smoking status: Never Smoker  . Smokeless tobacco: Never Used  Substance and Sexual Activity  . Alcohol use: No  . Drug use: No  . Sexual activity: Not Currently  Lifestyle  . Physical activity:    Days per week: Not on file    Minutes per session: Not on file  . Stress: Not on file  Relationships  . Social connections:    Talks on phone: Not on file    Gets together: Not on file    Attends religious service: Not on file    Active member of club or organization: Not on file    Attends meetings of clubs or organizations: Not on file    Relationship status: Not on file  . Intimate partner violence:    Fear of current or ex partner: Not on file    Emotionally abused: Not on file    Physically abused: Not on file    Forced sexual activity: Not on file  Other Topics Concern  . Not on file  Social History Narrative  . Not on file    Physical Exam      Future Appointments  Date  Time Provider Department Center  05/16/2018  3:00 PM [facility]  06/20/2018  2:20 PM Laurey Morale, MD [facility] None    BP (!) 142/92 (BP Location: Left Arm, Patient Position: Sitting, Cuff Size: Large)   Pulse 88   Resp 16   Wt 183 lb (83 kg)   LMP 04/15/2018   SpO2 97%   BMI 39.60 kg/m   Weight yesterday- 182 lb Last visit weight- 180 lb  Lisa Hodges was seen at home today and reported feeling generally well. She denied SOB, headache , dizziness or orthopnea. She has not been taking Bidil or carvedilol for the past several weeks because it makes her feel unwell' I have spoken to the clinic staff about this and they have deferred to her nephrologist at the dialysis center. She has not spoken to him about  this but assures me that she will next time she sees them. She advised her only issue today was her inability to pick up her insulin from the drug store, so I offered to pick it up for her. She agreed. I will bring this by her apartment later this afternoon.   Lisa Hodges, EMT 04/30/18  ACTION: Home visit completed Next visit planned for 1 month

## 2018-05-08 ENCOUNTER — Encounter: Payer: Self-pay | Admitting: Licensed Clinical Social Worker

## 2018-05-08 ENCOUNTER — Telehealth: Payer: Self-pay | Admitting: Licensed Clinical Social Worker

## 2018-05-08 NOTE — Telephone Encounter (Signed)
CSW received request to complete medical part of [employer] application from [place] Rorie @ [employer]. CSW contacted patient by phone and obtained consent to complete application. CSW reviewed needed items to complete application and fax back to [employer] eligibility office. Please contact if further needs arise. CSW continues to follow patient through the community paramedicine program. Lasandra Beech, LCSW, CCSW-M[facility] [phone]

## 2018-05-16 ENCOUNTER — Ambulatory Visit (INDEPENDENT_AMBULATORY_CARE_PROVIDER_SITE_OTHER): Payer: Self-pay | Admitting: Physician Assistant

## 2018-05-16 ENCOUNTER — Other Ambulatory Visit: Payer: Self-pay

## 2018-05-16 VITALS — BP 127/83 | HR 94 | Resp 18 | Ht <= 58 in | Wt 183.0 lb

## 2018-05-16 DIAGNOSIS — N186 End stage renal disease: Secondary | ICD-10-CM

## 2018-05-16 DIAGNOSIS — Z992 Dependence on renal dialysis: Secondary | ICD-10-CM

## 2018-05-16 NOTE — Progress Notes (Signed)
    Postoperative Access Visit   History of Present Illness   Lisa Hodges is a 32 y.o. year old female who presents for postoperative follow-up for: right brachiocephalic fistula superficialization by Dr. Imogene Burn 04/16/18.  The patient's wounds are  healed.  The patient notes no steal symptoms.  The patient is able to complete their activities of daily living.  She is dialyzing under the care of Dr. Eliott Nine and is dialyzing on a MWF schedule via [facility].   Physical Examination   Vitals:   05/16/18 1418  BP: 127/83  Pulse: 94  Resp: 18  SpO2: 97%  Weight: 183 lb (83 kg)  Height: 4\' 9"  (1.448 m)   Body mass index is 39.6 kg/m.  right arm Incision is healed, hand grip is 5/5, sensation in digits is intact, palpable thrill in Ac fossa up to mid upper arm, bruit can be auscultated throughout; palpable R radial pulse     Medical Decision Making   Stirling Rustad Kahler is a 32 y.o. year old female who presents s/p right brachiocephalic fistula superficialization   The patient's access is ready for use.  The patient's tunneled dialysis catheter can be removed when Nephrology is comfortable with the performance of R brachiocephalic fistula  She may follow up on an as needed basis   Emilie Rutter PA-C [facility] [place] Office: [phone]

## 2018-06-19 ENCOUNTER — Telehealth (HOSPITAL_COMMUNITY): Payer: Self-pay

## 2018-06-19 NOTE — Telephone Encounter (Signed)
I called Ms Chabot to schedule an appointment. She stated she would be home all day and any time would be fine to meet. We agreed to meet at 15:00.

## 2018-06-20 ENCOUNTER — Encounter (HOSPITAL_COMMUNITY): Payer: Medicaid Other | Admitting: Cardiology

## 2018-06-20 ENCOUNTER — Other Ambulatory Visit (HOSPITAL_COMMUNITY): Payer: Self-pay

## 2018-06-20 ENCOUNTER — Telehealth (HOSPITAL_COMMUNITY): Payer: Self-pay | Admitting: Surgery

## 2018-06-20 NOTE — Telephone Encounter (Signed)
Lisa Hodges Community Paramedic with HF [employer] program visited with patient today. She is in agreement to be discharged from the program at this time as she is now on hemodialysis and is independently managing her health at home.  She is aware to call with any concerns or questions.

## 2018-06-20 NOTE — Progress Notes (Signed)
Lisa Hodges was seen at home today and reported feeling well. She had been taking a nap upon my arrival so I asked if she wanted to reschedule. She said that she was doing fine and did not need anything. I asked if she feels confident handling her disease process on her own and she said she did but she was nervous that if she stopped being seen by paramedicine and something changed she would not be able to see [place] anymore. I explained that she can always reach back out to the HF clinic, where she will still be a patient, and ask for paramedicine if she needs help in the future. She was understanding and agreeable. I will advised Daphne to remove her from the paramedicine list.

## 2018-07-04 ENCOUNTER — Telehealth (HOSPITAL_COMMUNITY): Payer: Self-pay

## 2018-07-04 NOTE — Telephone Encounter (Signed)
Lisa Hodges called me to advised that she has been having pain on the top of her foot which occurs while she is walking but subsides when she is at rest/ She said she was concerned about a blood clot however she denied swelling, discoloration, distal discomfort or thermal changes. I advised that she should see her primary care physician if the problem persists. She was understanding and agreeable.

## 2018-07-15 NOTE — Pre-Procedure Instructions (Signed)
Samantha at Dr. Randa Evens office notified; EF 35-40% is outside the parameters for [facility].  Will need to be done at [facility].

## 2018-07-16 ENCOUNTER — Encounter (HOSPITAL_COMMUNITY): Payer: Self-pay | Admitting: *Deleted

## 2018-07-16 NOTE — Progress Notes (Signed)
Patient denies chest pain or shob. States cardiologist is Dr. Jearld Pies. States she still produces urine. Notified that we will need urine DOS. Patient states she does not have anyone to be with her post surgery or ride with her home on [place] transportation. I advised patient that she would have to have someone ride with her home and be with her 24 hours after anesthesia. I requested that she contact Dr. Barbette Merino if she did not have anyone.  Pt instructed to take 70% of her Novolog 70/30 insulin this evening (will take 5 units) and not to take it in the AM. Pt instructed to check her blood sugar when she gets up in the AM. If blood sugar is 70 or below, treat with 1/2 cup of clear juice (apple or cranberry) and recheck blood sugar 15 minutes after drinking juice. Pt voiced understanding.

## 2018-07-16 NOTE — H&P (Signed)
HISTORY AND PHYSICAL  Lisa Hodges is a 32 y.o. female patient with CC: referred by general dentist for removal third molars.   No diagnosis found.  Past Medical History:  Diagnosis Date  . Anemia   . CKD (chronic kidney disease), stage V (HCC)    Sees Dr Eliott Nine - [facility]  . Depression   . Dyspnea    with too much fluid  . Hypertension   . Migraine    "a few/year" (01/16/2018)  . Pneumonia   . PONV (postoperative nausea and vomiting)   . Systolic CHF (HCC)   . Type II diabetes mellitus (HCC)     No current facility-administered medications for this encounter.    Current Outpatient Medications  Medication Sig Dispense Refill  . NOVOLOG MIX 70/30 (70-30) 100 UNIT/ML injection Inject 8 Units into the skin 2 (two) times daily.  2  . RENVELA 2.4 g PACK Take 2.4 g by mouth 3 (three) times daily.  12  . carvedilol (COREG) 12.5 MG tablet Take 1 tablet (12.5 mg total) by mouth 2 (two) times daily with a meal. (Patient not taking: Reported on 04/30/2018) 60 tablet 11  . HYDROcodone-acetaminophen (NORCO) 5-325 MG tablet Take 1 tablet by mouth every 6 (six) hours as needed for moderate pain. (Patient not taking: Reported on 04/30/2018) 12 tablet 0  . isosorbide-hydrALAZINE (BIDIL) 20-37.5 MG tablet Take 2 tablets by mouth 3 (three) times daily. (Patient not taking: Reported on 04/30/2018) 180 tablet 11   No Known Allergies Active Problems:   * No active hospital problems. *  Vitals: There were no vitals taken for this visit. Lab results:No results found for this or any previous visit (from the past 24 hour(s)). Radiology Results: No results found. General appearance: alert, cooperative and morbidly obese Head: Normocephalic, without obvious abnormality, atraumatic Eyes: negative Nose: Nares normal. Septum midline. Mucosa normal. No drainage or sinus tenderness. Throat: Carious/impacted third molars. No purulence, edema. No trismus.  Neck: no adenopathy, supple, symmetrical,  trachea midline and thyroid not enlarged, symmetric, no tenderness/mass/nodules Resp: clear to auscultation bilaterally Cardio: regular rate and rhythm, S1, S2 normal, no murmur, click, rub or gallop  Assessment: Nonrestorable teeth 1, 16, 17, 32.  Plan: Extraction teeth with G A. Day surgery.    Ocie Doyne 07/16/2018

## 2018-07-17 NOTE — Anesthesia Preprocedure Evaluation (Addendum)
Anesthesia Evaluation  Patient identified by MRN, date of birth, ID band Patient awake    Reviewed: Allergy & Precautions, H&P , NPO status , Patient's Chart, lab work & pertinent test results  History of Anesthesia Complications (+) PONV and history of anesthetic complications  Airway Mallampati: IV  TM Distance: <3 FB Neck ROM: Full  Mouth opening: Limited Mouth Opening  Dental  (+) Teeth Intact, Dental Advisory Given   Pulmonary shortness of breath,    Pulmonary exam normal breath sounds clear to auscultation       Cardiovascular hypertension, Pt. on medications +CHF  Normal cardiovascular exam  Echo 3/19 Left ventricle: The cavity size was normal. Systolic function was   moderately reduced. The estimated ejection fraction was in the   range of 35% to 40%. Moderate diffuse hypokinesis with regional   variations. Features are consistent with a pseudonormal left   ventricular filling pattern, with concomitant abnormal relaxation   and increased filling pressure (grade 2 diastolic dysfunction).   Doppler parameters are consistent with high ventricular filling   pressure. - Aortic valve: There was trivial regurgitation. - Mitral valve: There was mild regurgitation. - Pulmonary arteries: PA peak pressure: 38 mm Hg (S). - Pericardium, extracardiac: A small to moderate, free-flowing   pericardial effusion was identified circumferential to the heart.   The fluid had no internal echoes.   Neuro/Psych  Headaches, PSYCHIATRIC DISORDERS Depression    GI/Hepatic Neg liver ROS,   Endo/Other  diabetes, Type obesity  Renal/GU CRFRenal disease     Musculoskeletal   Abdominal (+) + obese,   Peds  Hematology  (+) Blood dyscrasia, anemia ,   Anesthesia Other Findings   Reproductive/Obstetrics                          Anesthesia Physical Anesthesia Plan  ASA: III  Anesthesia Plan: General    Post-op Pain Management:    Induction: Intravenous  PONV Risk Score and Plan: 4 or greater and Treatment may vary due to age or medical condition, Ondansetron, Dexamethasone, Scopolamine patch - Pre-op and Midazolam  Airway Management Planned: Oral ETT  Additional Equipment:   Intra-op Plan:   Post-operative Plan: Extubation in OR  Informed Consent: I have reviewed the patients History and Physical, chart, labs and discussed the procedure including the risks, benefits and alternatives for the proposed anesthesia with the patient or authorized representative who has indicated his/her understanding and acceptance.   Dental advisory given  Plan Discussed with:   Anesthesia Plan Comments: (See PAT note written 07/17/2018 by Shonna Chock, PA-C. )       Anesthesia Quick Evaluation

## 2018-07-17 NOTE — Progress Notes (Signed)
Anesthesia Chart Review: SAME DAY WORK-UP  Case:  [id number] Date/Time:  07/18/18 0945   Procedure:  MULTIPLE EXTRACTION OF 3RD MOLARS (N/A )   Anesthesia type:  General   Pre-op diagnosis:  NON RESTORABLE   Location:  [facility] ROOM 07 / [facility]   Surgeon:  Ocie Doyne, DDS      DISCUSSION: Patient is a 32 year old female scheduled for the above procedure.   History includes ESRD (on hemodialysis MWF; s/p right brachiocephalic AVF superficialization 04/16/18), never smoker, post-operative N/V, HTN, chronic systolic CHF, dyspnea, DM2, anemia.   She is a same day work-up, so further evaluation on the day of surgery. Last HF follow-up 02/20/18.   VS: LMP 07/02/2018   PROVIDERS: Leilani Able, MD is PCP  Marca Ancona, MD is HF cardiologist. Last visit with Maxine Glenn, PA-C on 02/20/18.  Cardiomyopathy of uncertain etiology. 01/2017 stress test overall low risk and thought to likely be NICM. Prior viral myocarditis or long-standing DM or HTN possible contributing factors. Cath was not pursued due to CKD, so consideration in the future. Volume status good at that visit. Next appointment with Dr. Shirlee Latch on 08/20/18.  Rollene Rotunda, MD is primary cardiologist. Last visit 01/29/17.  Camille Bal, MD is nephrolgoist  LABS: She is for labs on the day of surgery. A1c on 04/16/18 was 5.9.    IMAGES: 1V CXR 01/16/18: IMPRESSION: No immediate postprocedure complication following Diatek catheter placement via the right internal jugular approach.   EKG: 11/26/17: NSR, LAD, cannot rule out anterior infarct (age undetermined).    CV: Echo 11/24/17: Study Conclusions - Left ventricle: The cavity size was normal. Systolic function was   moderately reduced. The estimated ejection fraction was in the   range of 35% to 40%. Moderate diffuse hypokinesis with regional   variations. Features are consistent with a pseudonormal left   ventricular filling pattern, with concomitant abnormal relaxation   and  increased filling pressure (grade 2 diastolic dysfunction).   Doppler parameters are consistent with high ventricular filling   pressure. - Aortic valve: There was trivial regurgitation. - Mitral valve: There was mild regurgitation. - Pulmonary arteries: PA peak pressure: 38 mm Hg (S). - Pericardium, extracardiac: A small to moderate, free-flowing   pericardial effusion was identified circumferential to the heart.   The fluid had no internal echoes. Impressions: - The right ventricular systolic pressure was increased consistent   with mild pulmonary hypertension.  Nuclear stress test 02/13/17:  The left ventricular ejection fraction is moderately decreased (30-44%).  Nuclear stress EF: 40%.  There was no ST segment deviation noted during stress.  No T wave inversion was noted during stress.  This is a low risk study. Low risk stress nuclear study with soft tissue attenuation artifacts and poor quality images. No major areas of reversible ischemia are seen. Calculated QGS EF is reduced, but gated images are of particularly low quality. Recommend corroboration with echo or other imaging study. 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    Past Medical History:  Diagnosis Date  . Anemia   . CKD (chronic kidney disease), stage V (HCC)    Sees Dr Eliott Nine -  Kid[facility]  . Depression   . Dyspnea    with too much fluid  . End stage kidney disease (HCC)    M/W/F [employer]  . Hypertension   . Migraine    "a few/year" (01/16/2018)  . Pneumonia   . PONV (postoperative nausea and vomiting)   .  Systolic CHF (HCC)   . Type II diabetes mellitus (HCC)     Past Surgical History:  Procedure Laterality Date  . AV FISTULA PLACEMENT Right 01/16/2018  . AV FISTULA PLACEMENT Right 01/16/2018   Procedure: ARTERIOVENOUS (AV) FISTULA CREATION RIGHT ARM;  Surgeon: [name], MD;  Location: [facility] OR;  Service: Vascular;  Laterality:  Right;  . CESAREAN SECTION  2009  . FISTULA SUPERFICIALIZATION Right 04/16/2018   Procedure: FISTULA SUPERFICIALIZATION ARTERIOVENOUS FISTULA RIGHT ARM;  Surgeon: Fransisco Hertz, MD;  Location[facility] OR;  Service: Vascular;  Laterality: Right;  . INSERTION OF DIALYSIS CATHETER Right 01/16/2018   DIATEK RIGHT INTERNAL JUGULAR  . INSERTION OF DIALYSIS CATHETER Right 01/16/2018   Procedure: INSERTION OF DIALYSIS CATHETER DIATEK RIGHT INTERNAL JUGULAR;  Surgeon: [name], MD;  Location: [facility] OR;  Service: Vascular;  Laterality: Right;    MEDICATIONS: No current facility-administered medications for this encounter.    Marland Kitchen NOVOLOG MIX 70/30 (70-30) 100 UNIT/ML injection  . RENVELA 2.4 g PACK  . carvedilol (COREG) 12.5 MG tablet  . HYDROcodone-acetaminophen (NORCO) 5-325 MG tablet  . isosorbide-hydrALAZINE (BIDIL) 20-37.5 MG tablet    Velna Ochs [facility] Short Stay Center/Anesthesiology Phone [phone] 07/17/2018 5:13 PM

## 2018-07-18 ENCOUNTER — Ambulatory Visit (HOSPITAL_COMMUNITY)
Admission: RE | Admit: 2018-07-18 | Discharge: 2018-07-18 | Disposition: A | Discharge status: Home / Self Care | Payer: Medicare Other | Source: Ambulatory Visit | Attending: Oral Surgery | Admitting: Oral Surgery

## 2018-07-18 ENCOUNTER — Encounter (HOSPITAL_COMMUNITY)
Admission: RE | Disposition: A | Discharge status: Home / Self Care | Payer: Self-pay | Source: Ambulatory Visit | Attending: Oral Surgery

## 2018-07-18 ENCOUNTER — Ambulatory Visit (HOSPITAL_COMMUNITY): Payer: Medicare Other | Admitting: Certified Registered"

## 2018-07-18 ENCOUNTER — Other Ambulatory Visit: Payer: Self-pay

## 2018-07-18 ENCOUNTER — Encounter (HOSPITAL_COMMUNITY): Payer: Self-pay

## 2018-07-18 DIAGNOSIS — Z794 Long term (current) use of insulin: Secondary | ICD-10-CM | POA: Diagnosis not present

## 2018-07-18 DIAGNOSIS — E1122 Type 2 diabetes mellitus with diabetic chronic kidney disease: Secondary | ICD-10-CM | POA: Insufficient documentation

## 2018-07-18 DIAGNOSIS — Z79899 Other long term (current) drug therapy: Secondary | ICD-10-CM | POA: Insufficient documentation

## 2018-07-18 DIAGNOSIS — I132 Hypertensive heart and chronic kidney disease with heart failure and with stage 5 chronic kidney disease, or end stage renal disease: Secondary | ICD-10-CM | POA: Insufficient documentation

## 2018-07-18 DIAGNOSIS — K011 Impacted teeth: Secondary | ICD-10-CM | POA: Insufficient documentation

## 2018-07-18 DIAGNOSIS — N186 End stage renal disease: Secondary | ICD-10-CM | POA: Insufficient documentation

## 2018-07-18 DIAGNOSIS — I428 Other cardiomyopathies: Secondary | ICD-10-CM | POA: Diagnosis not present

## 2018-07-18 DIAGNOSIS — I272 Pulmonary hypertension, unspecified: Secondary | ICD-10-CM | POA: Diagnosis not present

## 2018-07-18 DIAGNOSIS — F329 Major depressive disorder, single episode, unspecified: Secondary | ICD-10-CM | POA: Insufficient documentation

## 2018-07-18 DIAGNOSIS — I5022 Chronic systolic (congestive) heart failure: Secondary | ICD-10-CM | POA: Diagnosis not present

## 2018-07-18 HISTORY — DX: End stage renal disease: N18.6

## 2018-07-18 HISTORY — PX: MULTIPLE EXTRACTIONS WITH ALVEOLOPLASTY: SHX5342

## 2018-07-18 LAB — CBC
HCT: 34.3 % — ABNORMAL LOW (ref 36.0–46.0)
Hemoglobin: 9.5 g/dL — ABNORMAL LOW (ref 12.0–15.0)
MCH: 21.9 pg — ABNORMAL LOW (ref 26.0–34.0)
MCHC: 27.7 g/dL — AB (ref 30.0–36.0)
MCV: 79.2 fL — ABNORMAL LOW (ref 80.0–100.0)
PLATELETS: 305 10*3/uL (ref 150–400)
RBC: 4.33 MIL/uL (ref 3.87–5.11)
RDW: 17.2 % — ABNORMAL HIGH (ref 11.5–15.5)
WBC: 7.3 10*3/uL (ref 4.0–10.5)
nRBC: 0.4 % — ABNORMAL HIGH (ref 0.0–0.2)

## 2018-07-18 LAB — GLUCOSE, CAPILLARY
Glucose-Capillary: 160 mg/dL — ABNORMAL HIGH (ref 70–99)
Glucose-Capillary: 221 mg/dL — ABNORMAL HIGH (ref 70–99)
Glucose-Capillary: 209 mg/dL — ABNORMAL HIGH (ref 70–99)

## 2018-07-18 LAB — BASIC METABOLIC PANEL
Anion gap: 12 (ref 5–15)
BUN: 33 mg/dL — ABNORMAL HIGH (ref 6–20)
CHLORIDE: 97 mmol/L — AB (ref 98–111)
CO2: 27 mmol/L (ref 22–32)
CREATININE: 5.74 mg/dL — AB (ref 0.44–1.00)
Calcium: 8.8 mg/dL — ABNORMAL LOW (ref 8.9–10.3)
GFR, EST AFRICAN AMERICAN: 10 mL/min — AB (ref >60)
GFR, EST NON AFRICAN AMERICAN: 9 mL/min — AB (ref >60)
Glucose, Bld: 178 mg/dL — ABNORMAL HIGH (ref 70–99)
Potassium: 4.1 mmol/L (ref 3.5–5.1)
SODIUM: 136 mmol/L (ref 135–145)

## 2018-07-18 LAB — POCT PREGNANCY, URINE: Preg Test, Ur: NEGATIVE

## 2018-07-18 SURGERY — MULTIPLE EXTRACTION WITH ALVEOLOPLASTY
Anesthesia: General

## 2018-07-18 MED ORDER — PROPOFOL 10 MG/ML IV BOLUS
INTRAVENOUS | Status: AC
  Filled 2018-07-18: qty 20

## 2018-07-18 MED ORDER — CEFAZOLIN SODIUM-DEXTROSE 2-4 GM/100ML-% IV SOLN
2.0000 g | INTRAVENOUS | Status: AC
  Administered 2018-07-18: 2 g via INTRAVENOUS
  Filled 2018-07-18: qty 100

## 2018-07-18 MED ORDER — GLYCOPYRROLATE 0.2 MG/ML IJ SOLN
INTRAMUSCULAR | Status: DC | PRN
  Administered 2018-07-18: 0.1 mg via INTRAVENOUS

## 2018-07-18 MED ORDER — DEXAMETHASONE SODIUM PHOSPHATE 10 MG/ML IJ SOLN
INTRAMUSCULAR | Status: AC
  Filled 2018-07-18: qty 1

## 2018-07-18 MED ORDER — ESMOLOL HCL 100 MG/10ML IV SOLN
INTRAVENOUS | Status: AC
  Filled 2018-07-18: qty 10

## 2018-07-18 MED ORDER — SUCCINYLCHOLINE CHLORIDE 200 MG/10ML IV SOSY
PREFILLED_SYRINGE | INTRAVENOUS | Status: AC
  Filled 2018-07-18: qty 10

## 2018-07-18 MED ORDER — OXYMETAZOLINE HCL 0.05 % NA SOLN: NASAL | Status: DC | PRN

## 2018-07-18 MED ORDER — ONDANSETRON HCL 4 MG/2ML IJ SOLN
INTRAMUSCULAR | Status: DC | PRN
  Administered 2018-07-18: 4 mg via INTRAVENOUS

## 2018-07-18 MED ORDER — OXYCODONE-ACETAMINOPHEN 5-325 MG PO TABS: 1.0000 | ORAL_TABLET | ORAL | 0 refills | Status: AC | PRN

## 2018-07-18 MED ORDER — PHENYLEPHRINE 40 MCG/ML (10ML) SYRINGE FOR IV PUSH (FOR BLOOD PRESSURE SUPPORT)
PREFILLED_SYRINGE | INTRAVENOUS | Status: DC | PRN
  Administered 2018-07-18: 80 ug via INTRAVENOUS
  Administered 2018-07-18: 40 ug via INTRAVENOUS

## 2018-07-18 MED ORDER — LABETALOL HCL 5 MG/ML IV SOLN
INTRAVENOUS | Status: DC | PRN
  Administered 2018-07-18 (×2): 5 mg via INTRAVENOUS

## 2018-07-18 MED ORDER — ACETAMINOPHEN 10 MG/ML IV SOLN: 1000.0000 mg | Freq: Once | INTRAVENOUS | Status: DC | PRN

## 2018-07-18 MED ORDER — PROMETHAZINE HCL 25 MG/ML IJ SOLN: 6.2500 mg | INTRAMUSCULAR | Status: DC | PRN

## 2018-07-18 MED ORDER — SODIUM CHLORIDE 0.9 % IV SOLN
INTRAVENOUS | Status: AC | PRN
  Administered 2018-07-18: 1000 mL

## 2018-07-18 MED ORDER — LIDOCAINE-EPINEPHRINE 2 %-1:100000 IJ SOLN
INTRAMUSCULAR | Status: DC | PRN
  Administered 2018-07-18: 14 mL

## 2018-07-18 MED ORDER — AMOXICILLIN 500 MG PO CAPS: 500.0000 mg | ORAL_CAPSULE | Freq: Two times a day (BID) | ORAL | 0 refills | Status: AC

## 2018-07-18 MED ORDER — KETAMINE HCL 50 MG/5ML IJ SOSY
PREFILLED_SYRINGE | INTRAMUSCULAR | Status: AC
  Filled 2018-07-18: qty 5

## 2018-07-18 MED ORDER — PROPOFOL 10 MG/ML IV BOLUS
INTRAVENOUS | Status: DC | PRN
  Administered 2018-07-18 (×2): 50 mg via INTRAVENOUS
  Administered 2018-07-18: 150 mg via INTRAVENOUS

## 2018-07-18 MED ORDER — SUCCINYLCHOLINE CHLORIDE 200 MG/10ML IV SOSY
PREFILLED_SYRINGE | INTRAVENOUS | Status: DC | PRN
  Administered 2018-07-18: 80 mg via INTRAVENOUS

## 2018-07-18 MED ORDER — SCOPOLAMINE 1 MG/3DAYS TD PT72
1.0000 | MEDICATED_PATCH | TRANSDERMAL | Status: DC
  Administered 2018-07-18: 1.5 mg via TRANSDERMAL
  Filled 2018-07-18: qty 1

## 2018-07-18 MED ORDER — LIDOCAINE-EPINEPHRINE 2 %-1:100000 IJ SOLN
INTRAMUSCULAR | Status: AC
  Filled 2018-07-18: qty 1

## 2018-07-18 MED ORDER — DEXAMETHASONE SODIUM PHOSPHATE 10 MG/ML IJ SOLN
INTRAMUSCULAR | Status: DC | PRN
  Administered 2018-07-18: 10 mg via INTRAVENOUS

## 2018-07-18 MED ORDER — CISATRACURIUM BESYLATE 20 MG/10ML IV SOLN
INTRAVENOUS | Status: AC
  Filled 2018-07-18: qty 10

## 2018-07-18 MED ORDER — INSULIN ASPART 100 UNIT/ML ~~LOC~~ SOLN
SUBCUTANEOUS | Status: AC
  Filled 2018-07-18: qty 1

## 2018-07-18 MED ORDER — 0.9 % SODIUM CHLORIDE (POUR BTL) OPTIME
TOPICAL | Status: DC | PRN
  Administered 2018-07-18: 400 mL

## 2018-07-18 MED ORDER — HYDROCODONE-ACETAMINOPHEN 7.5-325 MG PO TABS
ORAL_TABLET | ORAL | Status: AC
  Filled 2018-07-18: qty 1

## 2018-07-18 MED ORDER — ONDANSETRON HCL 4 MG/2ML IJ SOLN
INTRAMUSCULAR | Status: AC
  Filled 2018-07-18: qty 4

## 2018-07-18 MED ORDER — OXYMETAZOLINE HCL 0.05 % NA SOLN
NASAL | Status: AC
  Filled 2018-07-18: qty 15

## 2018-07-18 MED ORDER — LIDOCAINE 2% (20 MG/ML) 5 ML SYRINGE
INTRAMUSCULAR | Status: DC | PRN
  Administered 2018-07-18: 40 mg via INTRAVENOUS

## 2018-07-18 MED ORDER — DEXMEDETOMIDINE HCL 200 MCG/2ML IV SOLN
INTRAVENOUS | Status: DC | PRN
  Administered 2018-07-18 (×3): 4 ug via INTRAVENOUS
  Administered 2018-07-18: 12 ug via INTRAVENOUS
  Administered 2018-07-18: 4 ug via INTRAVENOUS

## 2018-07-18 MED ORDER — LIDOCAINE 2% (20 MG/ML) 5 ML SYRINGE
INTRAMUSCULAR | Status: AC
  Filled 2018-07-18: qty 5

## 2018-07-18 MED ORDER — MIDAZOLAM HCL 5 MG/5ML IJ SOLN
INTRAMUSCULAR | Status: DC | PRN
  Administered 2018-07-18: 1 mg via INTRAVENOUS

## 2018-07-18 MED ORDER — HYDROCODONE-ACETAMINOPHEN 7.5-325 MG PO TABS
1.0000 | ORAL_TABLET | Freq: Once | ORAL | Status: AC | PRN
  Administered 2018-07-18: 1 via ORAL

## 2018-07-18 MED ORDER — FENTANYL CITRATE (PF) 100 MCG/2ML IJ SOLN
INTRAMUSCULAR | Status: DC | PRN
  Administered 2018-07-18 (×2): 50 ug via INTRAVENOUS

## 2018-07-18 MED ORDER — FENTANYL CITRATE (PF) 250 MCG/5ML IJ SOLN
INTRAMUSCULAR | Status: AC
  Filled 2018-07-18: qty 5

## 2018-07-18 MED ORDER — LABETALOL HCL 5 MG/ML IV SOLN
INTRAVENOUS | Status: AC
  Filled 2018-07-18: qty 4

## 2018-07-18 MED ORDER — MEPERIDINE HCL 50 MG/ML IJ SOLN: 6.2500 mg | INTRAMUSCULAR | Status: DC | PRN

## 2018-07-18 MED ORDER — INSULIN ASPART 100 UNIT/ML ~~LOC~~ SOLN
3.0000 [IU] | Freq: Once | SUBCUTANEOUS | Status: AC
  Administered 2018-07-18: 3 [IU] via SUBCUTANEOUS

## 2018-07-18 MED ORDER — PROPOFOL 500 MG/50ML IV EMUL
INTRAVENOUS | Status: DC | PRN
  Administered 2018-07-18: 100 ug/kg/min via INTRAVENOUS

## 2018-07-18 MED ORDER — PHENYLEPHRINE 40 MCG/ML (10ML) SYRINGE FOR IV PUSH (FOR BLOOD PRESSURE SUPPORT)
PREFILLED_SYRINGE | INTRAVENOUS | Status: AC
  Filled 2018-07-18: qty 10

## 2018-07-18 MED ORDER — MIDAZOLAM HCL 2 MG/2ML IJ SOLN
INTRAMUSCULAR | Status: AC
  Filled 2018-07-18: qty 2

## 2018-07-18 MED ORDER — HYDROMORPHONE HCL 1 MG/ML IJ SOLN: 0.2500 mg | INTRAMUSCULAR | Status: DC | PRN

## 2018-07-18 MED ORDER — ESMOLOL HCL 100 MG/10ML IV SOLN
INTRAVENOUS | Status: DC | PRN
  Administered 2018-07-18: 20 mg via INTRAVENOUS

## 2018-07-18 MED ORDER — SODIUM CHLORIDE 0.9 % IV SOLN
INTRAVENOUS | Status: DC | PRN
  Administered 2018-07-18: 10:00:00 via INTRAVENOUS

## 2018-07-18 SURGICAL SUPPLY — 33 items
BUR CROSS CUT FISSURE 1.6 (BURR) ×2 IMPLANT
BUR CROSS CUT FISSURE 1.6MM (BURR) ×1
CANISTER SUCT 3000ML PPV (MISCELLANEOUS) ×3 IMPLANT
COVER SURGICAL LIGHT HANDLE (MISCELLANEOUS) ×3 IMPLANT
COVER WAND RF STERILE (DRAPES) ×1 IMPLANT
CRADLE DONUT ADULT HEAD (MISCELLANEOUS) ×3 IMPLANT
DECANTER SPIKE VIAL GLASS SM (MISCELLANEOUS) ×2 IMPLANT
DRAPE U-SHAPE 76X120 STRL (DRAPES) ×3 IMPLANT
GAUZE PACKING FOLDED 2  STR (GAUZE/BANDAGES/DRESSINGS) ×2
GAUZE PACKING FOLDED 2 STR (GAUZE/BANDAGES/DRESSINGS) ×1 IMPLANT
GLOVE BIO SURGEON STRL SZ 6.5 (GLOVE) ×1 IMPLANT
GLOVE BIO SURGEON STRL SZ7.5 (GLOVE) ×3 IMPLANT
GLOVE BIO SURGEONS STRL SZ 6.5 (GLOVE) ×1
GLOVE BIOGEL PI IND STRL 6.5 (GLOVE) IMPLANT
GLOVE BIOGEL PI IND STRL 7.0 (GLOVE) IMPLANT
GLOVE BIOGEL PI INDICATOR 6.5 (GLOVE)
GLOVE BIOGEL PI INDICATOR 7.0 (GLOVE)
GOWN STRL REUS W/ TWL LRG LVL3 (GOWN DISPOSABLE) ×1 IMPLANT
GOWN STRL REUS W/ TWL XL LVL3 (GOWN DISPOSABLE) ×1 IMPLANT
GOWN STRL REUS W/TWL LRG LVL3 (GOWN DISPOSABLE) ×2
GOWN STRL REUS W/TWL XL LVL3 (GOWN DISPOSABLE) ×2
IV NS 1000ML (IV SOLUTION) ×2
IV NS 1000ML BAXH (IV SOLUTION) ×1 IMPLANT
KIT BASIN OR (CUSTOM PROCEDURE TRAY) ×3 IMPLANT
KIT TURNOVER KIT B (KITS) ×3 IMPLANT
NEEDLE 22X1 1/2 (OR ONLY) (NEEDLE) ×6 IMPLANT
NS IRRIG 1000ML POUR BTL (IV SOLUTION) ×3 IMPLANT
PAD ARMBOARD 7.5X6 YLW CONV (MISCELLANEOUS) ×3 IMPLANT
SUT CHROMIC 3 0 PS 2 (SUTURE) ×3 IMPLANT
SYR CONTROL 10ML LL (SYRINGE) ×3 IMPLANT
TRAY ENT MC OR (CUSTOM PROCEDURE TRAY) ×3 IMPLANT
TUBING IRRIGATION (MISCELLANEOUS) ×3 IMPLANT
YANKAUER SUCT BULB TIP NO VENT (SUCTIONS) ×3 IMPLANT

## 2018-07-18 NOTE — Anesthesia Postprocedure Evaluation (Signed)
Anesthesia Post Note  Patient: Lisa Hodges  Procedure(s) Performed: MULTIPLE EXTRACTION OF 3RD MOLARS (N/A )     Patient location during evaluation: PACU Anesthesia Type: General Level of consciousness: awake and alert Pain management: pain level controlled Vital Signs Assessment: post-procedure vital signs reviewed and stable Respiratory status: spontaneous breathing, nonlabored ventilation, respiratory function stable and patient connected to nasal cannula oxygen Cardiovascular status: blood pressure returned to baseline and stable Postop Assessment: no apparent nausea or vomiting Anesthetic complications: no    Last Vitals:  Vitals:   07/18/18 1245 07/18/18 1300  BP: (!) 144/89 (!) 145/91  Pulse: 90 92  Resp: 16 19  Temp:  (!) 36.2 C  SpO2: 97% 96%    Last Pain:  Vitals:   07/18/18 1130  PainSc: Asleep                 Trevor Iha

## 2018-07-18 NOTE — Transfer of Care (Signed)
Immediate Anesthesia Transfer of Care Note  Patient: Lisa Hodges  Procedure(s) Performed: MULTIPLE EXTRACTION OF 3RD MOLARS (N/A )  Patient Location: PACU  Anesthesia Type:General  Level of Consciousness: drowsy  Airway & Oxygen Therapy: Patient Spontanous Breathing and Patient connected to face mask oxygen  Post-op Assessment: Report given to RN and Post -op Vital signs reviewed and stable  Post vital signs: Reviewed and stable  Last Vitals:  Vitals Value Taken Time  BP 127/82 07/18/2018 11:30 AM  Temp 36.4 C 07/18/2018 11:30 AM  Pulse 97 07/18/2018 11:33 AM  Resp 28 07/18/2018 11:33 AM  SpO2 98 % 07/18/2018 11:33 AM  Vitals shown include unvalidated device data.  Last Pain:  Vitals:   07/18/18 0808  PainSc: 0-No pain         Complications: No apparent anesthesia complications

## 2018-07-18 NOTE — Op Note (Signed)
NAME: Lisa Hodges, Lisa Hodges MEDICAL RECORD GY:[id number] ACCOUNT [id number] DATE OF BIRTH:Jan 08, 1986 FACILITY: [facility] LOCATION: [facility] PHYSICIAN:Rayquan Amrhein M. Ty Buntrock, DDS  OPERATIVE REPORT  DATE OF PROCEDURE:  07/18/2018  PREOPERATIVE DIAGNOSIS:  Nonrestorable impacted teeth numbers 1, 16, 17 and 32.  POSTOPERATIVE DIAGNOSIS:  Nonrestorable impacted teeth numbers 1, 16, 17 and 32.  PROCEDURE:  Extraction teeth numbers 1, 16, 17 and 32.  SURGEON:  Ocie Doyne, DDS  ANESTHESIA:  General and oral intubation.    DESCRIPTION OF PROCEDURE:  The patient was taken to the operating room and placed on the table in supine position.  General anesthesia was administered intravenously and an oral endotracheal tube was placed and secured.  The patient was draped for  surgery.  A timeout was performed.  The posterior pharynx was suctioned and a throat pack was placed, 2% lidocaine 1:100,000 epinephrine was infiltrated in the inferior alveolar block on the right and left sides and in buccal and palatal infiltration in  the posterior maxilla around the impacted upper wisdom teeth.  A total of 15 mL was utilized.  A bite block was placed on the right side of the mouth and the left side was operated first.  A #15 blade was used to make an incision around tooth 17 in the  buccal sulcus.  The periosteum was reflected to expose the alveolar bone and then bone was removed.  The tooth was luxated with a 301 elevator, but could not be elevated.  The tooth was sectioned into multiple pieces and eventually removed with a 301  elevator.  Then, the socket was curetted, irrigated and closed with 3-0 chromic.  In the left maxilla, tooth 16 proved to be difficult to access.  The #15 blade was used to make an incision overlying the tooth.  The periosteum was reflected.  The tooth  was elevated with a variety of elevators from buccal elevation with a 301.  A [employer], structural and [employer].  The tooth could not be luxated.   Finally, a palatal approach was used.  The periosteum was reflected palatally around this tooth.  Then,  using the Potts elevator tooth 16 was elevated and removed from the mouth.  The socket was curetted, irrigated and closed with 3-0 chromic.  Then, the drapes were removed to allow the endotracheal tube to be taped to the left side of the mouth.  The  drapes were replaced, and then the bite block was placed on the left side of the mouth.  A #15 blade was used to make an incision around teeth 1 and 32.  The periosteum was reflected.  Bone was removed from around these teeth.  The teeth were elevated  with a 301 elevator and Potts elevator in the maxilla and the teeth were removed.  Then, the sockets were curetted, irrigated, and closed with 3-0 chromic.  The patient was then irrigated and suctioned and the throat pack was removed.  The patient was  left in care of anesthesia for extubation and transport to recovery room with plans for discharge home through day surgery.  ESTIMATED BLOOD LOSS:  Minimal.  COMPLICATIONS:  None.  SPECIMENS:  None.  TN/NUANCE  D:07/18/2018 T:07/18/2018 JOB:[id number]

## 2018-07-18 NOTE — Anesthesia Procedure Notes (Signed)
Procedure Name: Intubation Date/Time: 07/18/2018 10:26 AM Performed by: Marny Lowenstein, CRNA Pre-anesthesia Checklist: Patient identified, Emergency Drugs available, Suction available and Patient being monitored Patient Re-evaluated:Patient Re-evaluated prior to induction Oxygen Delivery Method: Circle system utilized Preoxygenation: Pre-oxygenation with 100% oxygen Induction Type: IV induction Ventilation: Mask ventilation without difficulty Laryngoscope Size: Glidescope and 3 Grade View: Grade I Tube type: Oral Tube size: 6.5 mm Number of attempts: 1 Airway Equipment and Method: Patient positioned with wedge pillow and Rigid stylet Placement Confirmation: ETT inserted through vocal cords under direct vision,  positive ETCO2 and breath sounds checked- equal and bilateral Secured at: 20 cm Tube secured with: Tape Dental Injury: Teeth and Oropharynx as per pre-operative assessment  Difficulty Due To: Difficulty was anticipated Comments: Difficulty anticipated d/t very narrow palate.

## 2018-07-18 NOTE — OR Nursing (Addendum)
Spoke with pt boyfriend who will be with pt at home. Medicaid transportation will be taking pt home to boyfriend. They have a daughter with cerebral palsy that is getting off the bus and pt boyfriend is unable to take pt home himself. Discharge instructions were reviewed with pt and pt boyfriend. Both denied any questions regarding teaching. Pt pain is tolerable. Pt boyfriend was called when pt was on her way to the house.   Pt boyfriend name is Loistine Chance, phone number [phone]

## 2018-07-18 NOTE — Op Note (Signed)
07/18/2018  11:11 AM  PATIENT:  Lisa Hodges  32 y.o. female  PRE-OPERATIVE DIAGNOSIS:  NON RESTORABLE/IMPACTED TEETH #1, 16, 17, 32  POST-OPERATIVE DIAGNOSIS:  SAME  PROCEDURE:  Procedure(s): MULTIPLE EXTRACTION OF 3RD MOLARS  SURGEON:  Surgeon(s): Ocie Doyne, DDS  ANESTHESIA:   local and general  EBL:  minimal  DRAINS: none   SPECIMEN:  No Specimen  COUNTS:  YES  PLAN OF CARE: Discharge to home after PACU  PATIENT DISPOSITION:  PACU - hemodynamically stable.   PROCEDURE DETAILS: Dictation # [id number]  Georgia Lopes, DMD 07/18/2018 11:11 AM

## 2018-07-18 NOTE — H&P (Signed)
H&P documentation  -History and Physical Reviewed  -Patient has been re-examined  -No change in the plan of care  Lisa Hodges  

## 2018-07-19 ENCOUNTER — Encounter (HOSPITAL_COMMUNITY): Payer: Self-pay | Admitting: Oral Surgery

## 2018-08-10 IMAGING — DX DG CHEST 2V
2 series · 2 of 2 positions shown · non-contrast
Comparison: 10/26/2016, 10/12/2016

CLINICAL DATA: Shortness of breath

EXAM:
CHEST  2 VIEW

[chest pa]
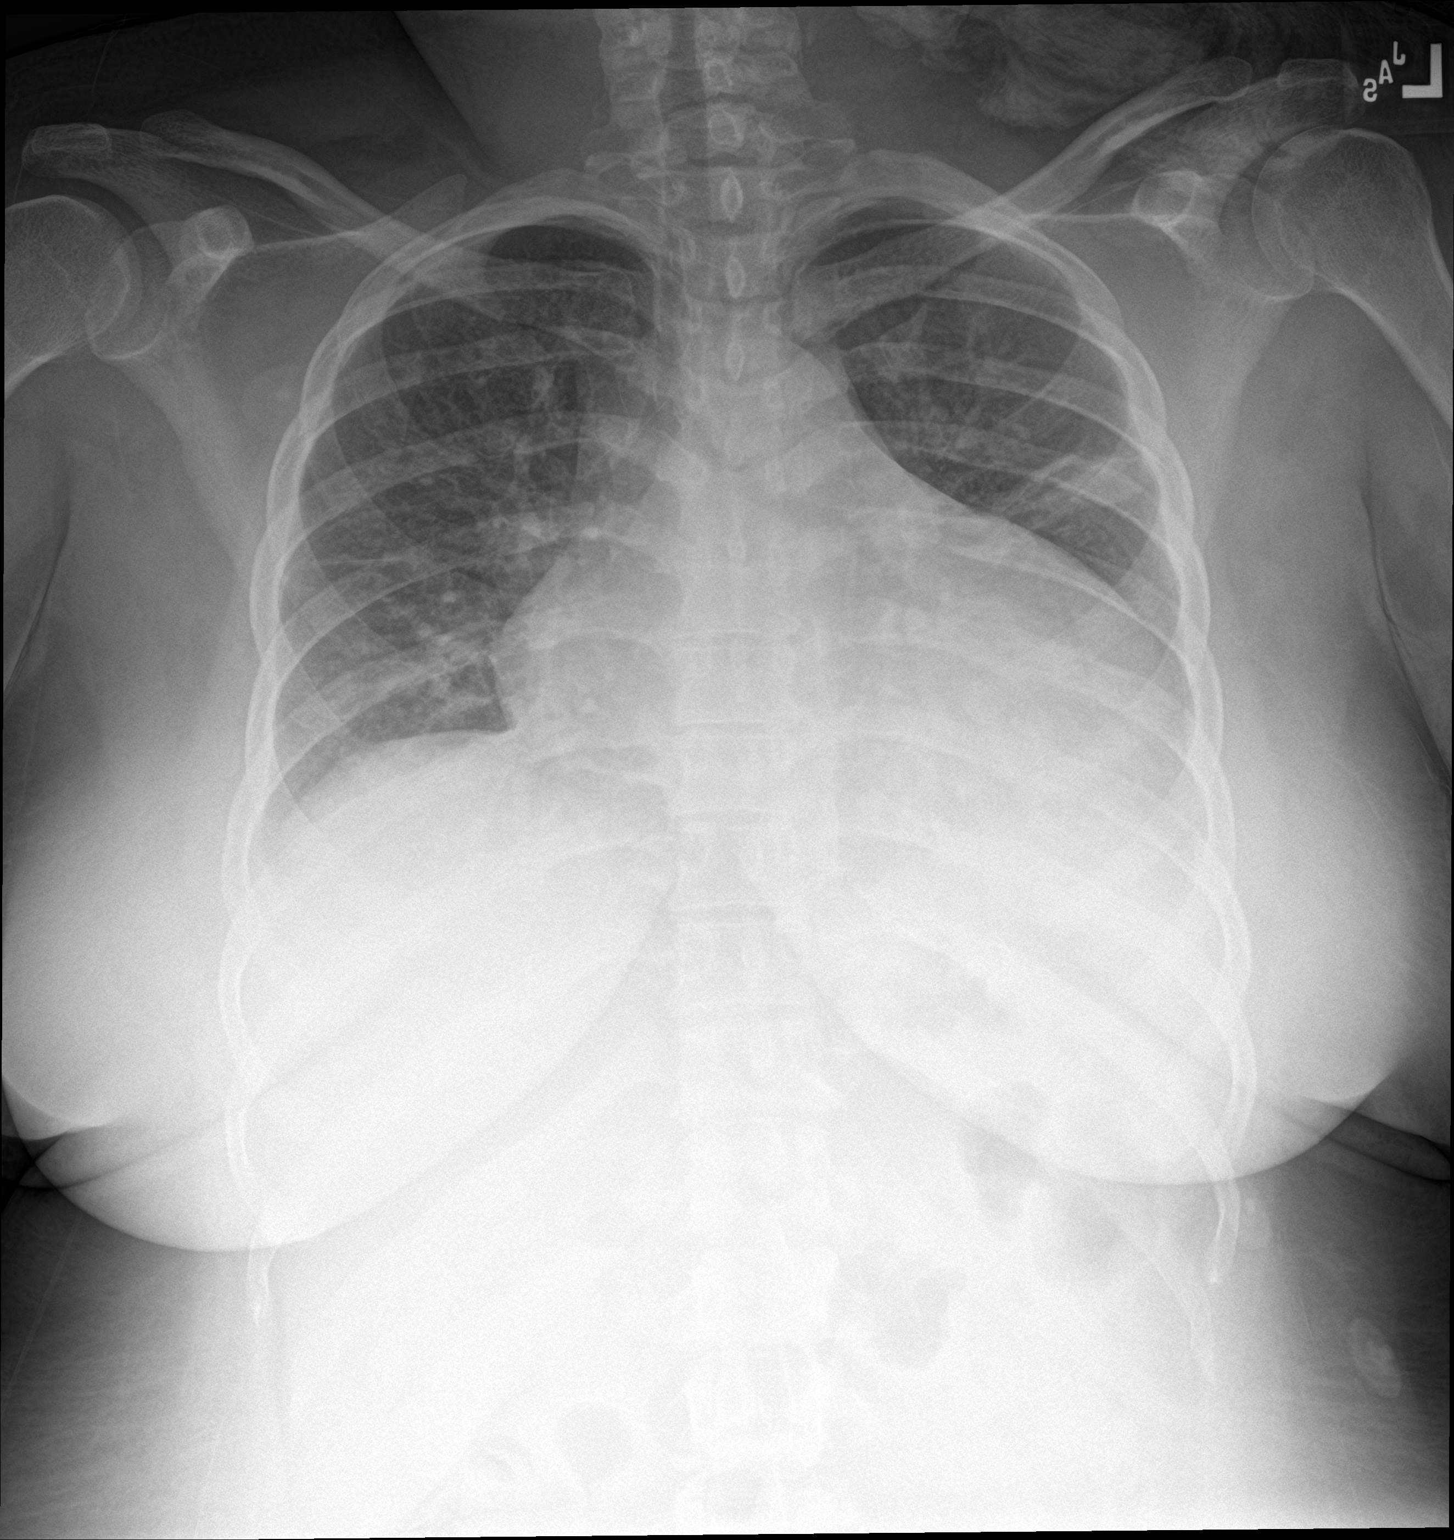

[chest lat]
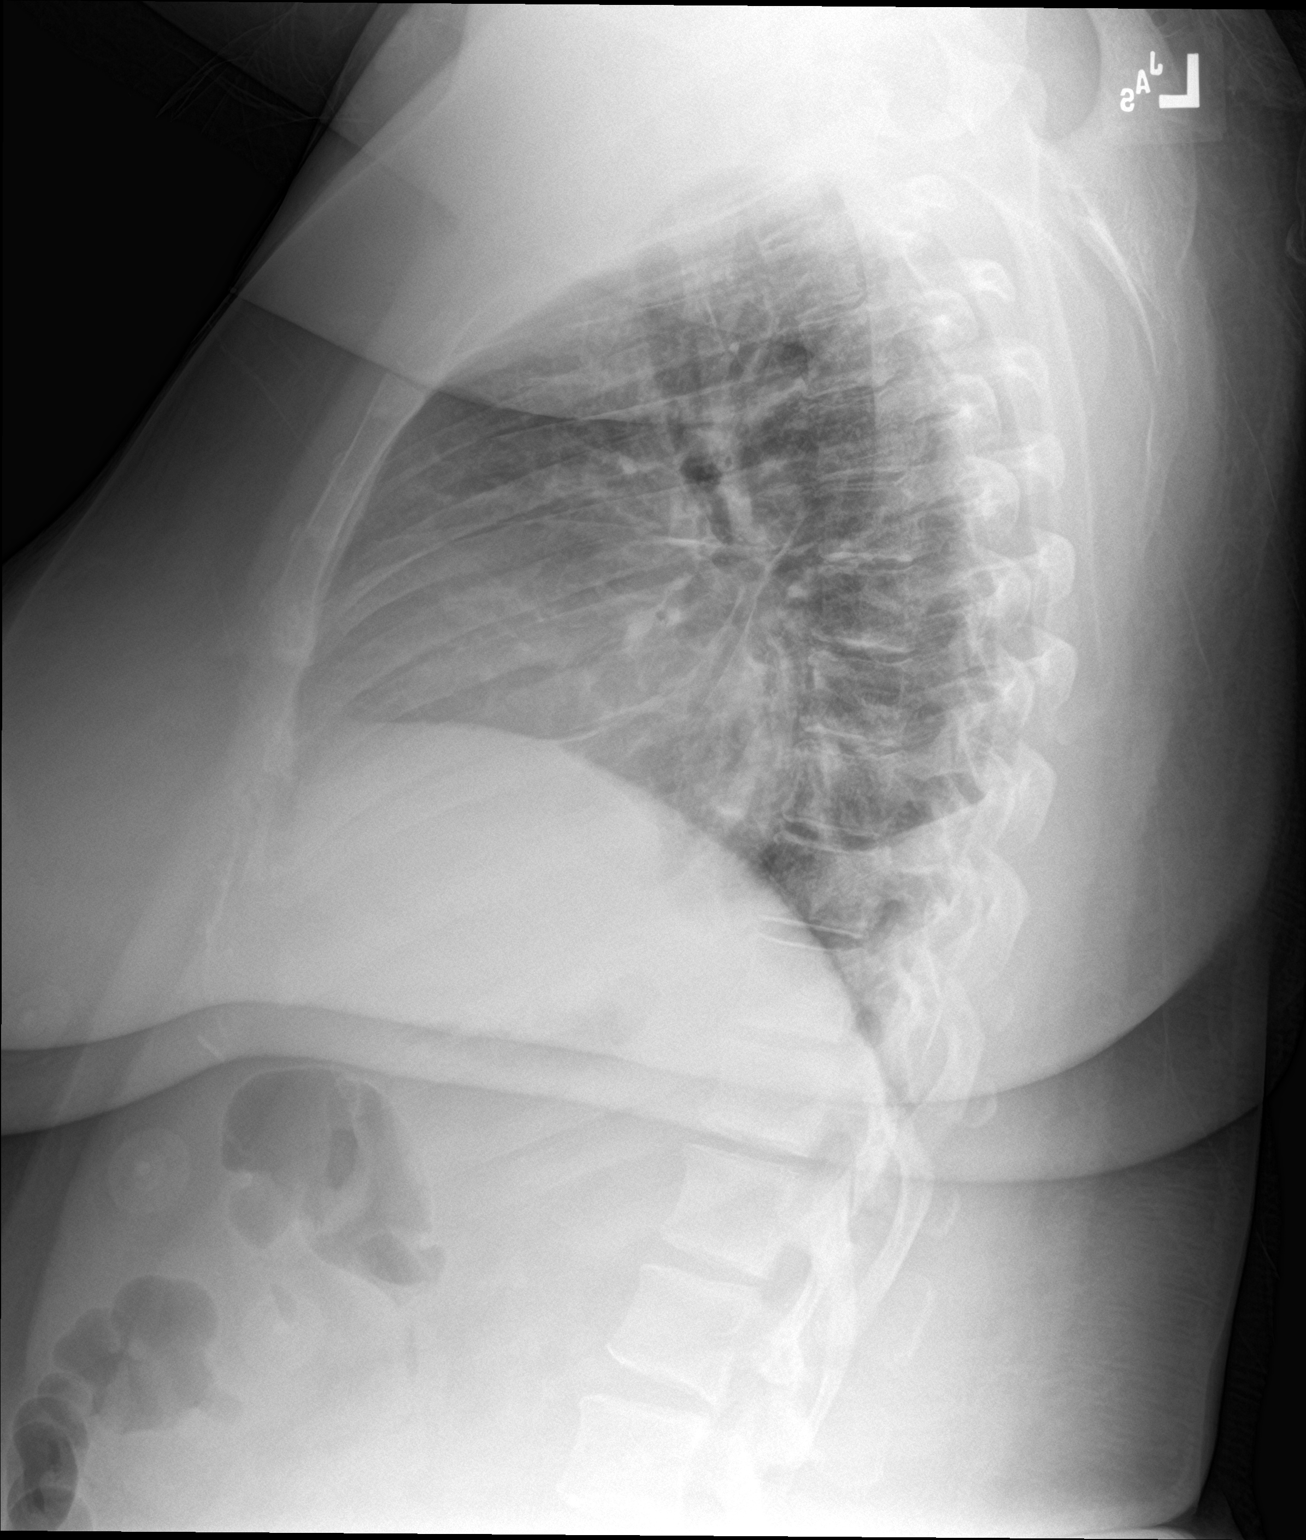

[2 of 2 positions shown; findings below may reference images not displayed]

FINDINGS: Cardiomegaly with mild central vascular congestion. Streaky
atelectasis at the left lung base. Small focal opacity in the left
mid lung, new compared to prior. No pneumothorax.
IMPRESSION: 1. Oval focal opacity in the left mid lung, new compared to prior
may represent focus of infection
2. Similar appearance of cardiomegaly with mild vascular congestion.
3. Partial but incomplete clearing of left basilar airspace disease.

## 2018-08-20 ENCOUNTER — Encounter (HOSPITAL_COMMUNITY): Payer: Medicaid Other | Admitting: Cardiology

## 2018-09-30 IMAGING — CR DG CHEST 1V PORT
1 series · 1 of 1 positions shown · non-contrast
Comparison: Chest x-ray of November 26, 2017

CLINICAL DATA: Status post diet check catheter placement.

EXAM:
PORTABLE CHEST 1 VIEW

[AP]
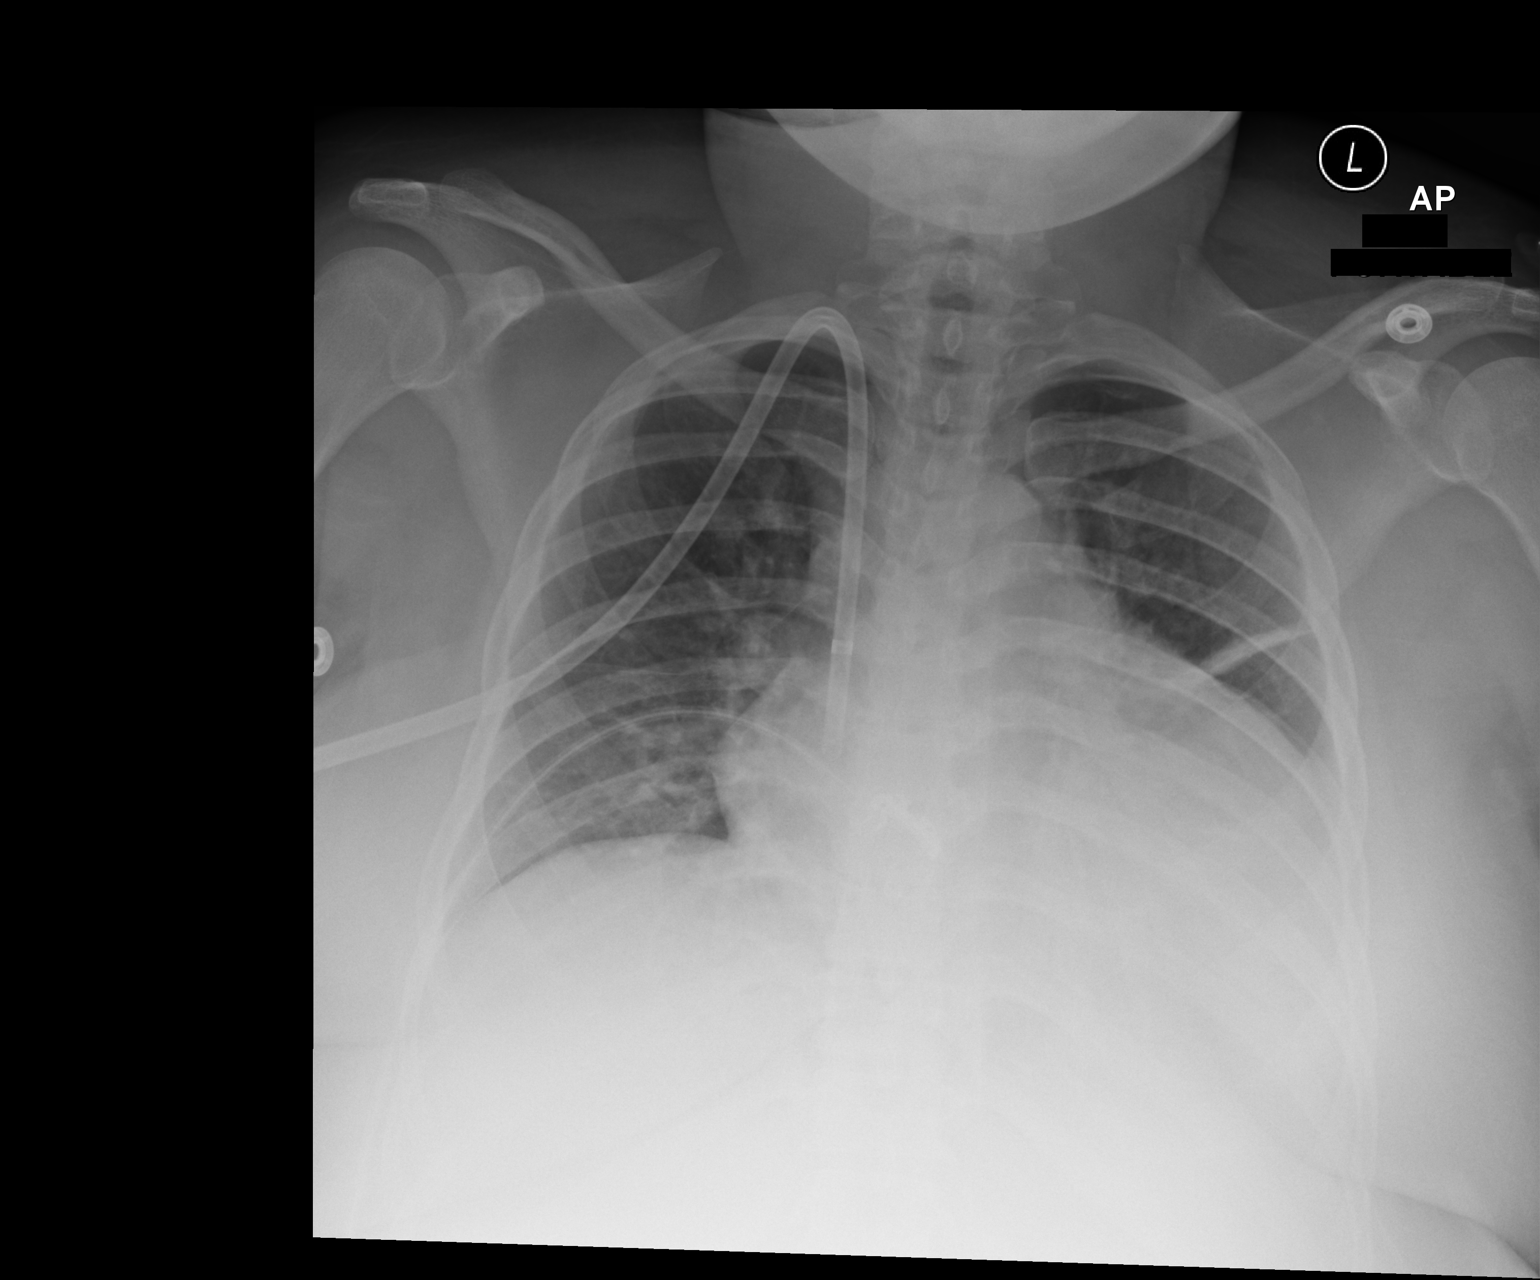

[1 of 1 positions shown; findings below may reference images not displayed]

FINDINGS: The patient has undergone a dialysis catheter placement with the tip
projecting at the cavoatrial junction. There is no postprocedure
pneumothorax. There is stable increased density in the left mid lung
and the left hemidiaphragm remains obscured. The cardiac silhouette
remains enlarged. The pulmonary vascularity is mildly engorged
centrally.
IMPRESSION: No immediate postprocedure complication following Diatek catheter
placement via the right internal jugular approach..
